# Patient Record
Sex: Female | Born: 1935 | Race: White | Hispanic: No | Smoking: Former smoker
Health system: Southern US, Community
[De-identification: ages and names within clinical notes are randomized; demographics above are authoritative.]

## PROBLEM LIST (undated history)

## (undated) DIAGNOSIS — F329 Major depressive disorder, single episode, unspecified: Secondary | ICD-10-CM

## (undated) DIAGNOSIS — G3184 Mild cognitive impairment, so stated: Secondary | ICD-10-CM

## (undated) DIAGNOSIS — R300 Dysuria: Secondary | ICD-10-CM

## (undated) DIAGNOSIS — N189 Chronic kidney disease, unspecified: Secondary | ICD-10-CM

## (undated) DIAGNOSIS — C4490 Unspecified malignant neoplasm of skin, unspecified: Secondary | ICD-10-CM

## (undated) DIAGNOSIS — B351 Tinea unguium: Secondary | ICD-10-CM

## (undated) DIAGNOSIS — J387 Other diseases of larynx: Secondary | ICD-10-CM

## (undated) DIAGNOSIS — K219 Gastro-esophageal reflux disease without esophagitis: Secondary | ICD-10-CM

## (undated) DIAGNOSIS — F32A Depression, unspecified: Secondary | ICD-10-CM

## (undated) DIAGNOSIS — S62101A Fracture of unspecified carpal bone, right wrist, initial encounter for closed fracture: Secondary | ICD-10-CM

## (undated) DIAGNOSIS — M810 Age-related osteoporosis without current pathological fracture: Secondary | ICD-10-CM

## (undated) DIAGNOSIS — R739 Hyperglycemia, unspecified: Secondary | ICD-10-CM

## (undated) DIAGNOSIS — I6529 Occlusion and stenosis of unspecified carotid artery: Secondary | ICD-10-CM

## (undated) DIAGNOSIS — E875 Hyperkalemia: Secondary | ICD-10-CM

## (undated) DIAGNOSIS — I10 Essential (primary) hypertension: Secondary | ICD-10-CM

## (undated) DIAGNOSIS — E538 Deficiency of other specified B group vitamins: Secondary | ICD-10-CM

## (undated) DIAGNOSIS — N200 Calculus of kidney: Secondary | ICD-10-CM

## (undated) DIAGNOSIS — R498 Other voice and resonance disorders: Secondary | ICD-10-CM

## (undated) DIAGNOSIS — E785 Hyperlipidemia, unspecified: Secondary | ICD-10-CM

## (undated) DIAGNOSIS — G609 Hereditary and idiopathic neuropathy, unspecified: Secondary | ICD-10-CM

## (undated) DIAGNOSIS — R569 Unspecified convulsions: Secondary | ICD-10-CM

## (undated) DIAGNOSIS — K2100 Gastro-esophageal reflux disease with esophagitis, without bleeding: Secondary | ICD-10-CM

## (undated) DIAGNOSIS — N905 Atrophy of vulva: Secondary | ICD-10-CM

## (undated) DIAGNOSIS — M7120 Synovial cyst of popliteal space [Baker], unspecified knee: Secondary | ICD-10-CM

## (undated) DIAGNOSIS — R32 Unspecified urinary incontinence: Secondary | ICD-10-CM

## (undated) DIAGNOSIS — H3530 Unspecified macular degeneration: Secondary | ICD-10-CM

## (undated) DIAGNOSIS — E569 Vitamin deficiency, unspecified: Secondary | ICD-10-CM

## (undated) DIAGNOSIS — I82409 Acute embolism and thrombosis of unspecified deep veins of unspecified lower extremity: Secondary | ICD-10-CM

## (undated) HISTORY — DX: Vitamin deficiency, unspecified: E56.9

## (undated) HISTORY — DX: Age-related osteoporosis without current pathological fracture: M81.0

## (undated) HISTORY — DX: Chronic kidney disease, unspecified: N18.9

## (undated) HISTORY — DX: Occlusion and stenosis of unspecified carotid artery: I65.29

## (undated) HISTORY — DX: Calculus of kidney: N20.0

## (undated) HISTORY — DX: Hyperkalemia: E87.5

## (undated) HISTORY — DX: Atrophy of vulva: N90.5

## (undated) HISTORY — PX: EXCISIONAL HEMORRHOIDECTOMY: SHX1541

## (undated) HISTORY — DX: Hyperglycemia, unspecified: R73.9

## (undated) HISTORY — DX: Gastro-esophageal reflux disease with esophagitis: K21.0

## (undated) HISTORY — DX: Major depressive disorder, single episode, unspecified: F32.9

## (undated) HISTORY — DX: Other diseases of larynx: J38.7

## (undated) HISTORY — DX: Depression, unspecified: F32.A

## (undated) HISTORY — PX: STOMACH SURGERY: SHX791

## (undated) HISTORY — DX: Hereditary and idiopathic neuropathy, unspecified: G60.9

## (undated) HISTORY — PX: VAGINAL HYSTERECTOMY: SUR661

## (undated) HISTORY — DX: Unspecified macular degeneration: H35.30

## (undated) HISTORY — DX: Mild cognitive impairment of uncertain or unknown etiology: G31.84

## (undated) HISTORY — DX: Unspecified malignant neoplasm of skin, unspecified: C44.90

## (undated) HISTORY — DX: Unspecified urinary incontinence: R32

## (undated) HISTORY — DX: Deficiency of other specified B group vitamins: E53.8

## (undated) HISTORY — DX: Hyperlipidemia, unspecified: E78.5

## (undated) HISTORY — DX: Tinea unguium: B35.1

## (undated) HISTORY — DX: Dysuria: R30.0

## (undated) HISTORY — DX: Essential (primary) hypertension: I10

## (undated) HISTORY — DX: Gastro-esophageal reflux disease with esophagitis, without bleeding: K21.00

## (undated) HISTORY — DX: Other voice and resonance disorders: R49.8

## (undated) HISTORY — PX: DILATION AND CURETTAGE OF UTERUS: SHX78

---
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
## 1989-07-07 HISTORY — PX: CATARACT EXTRACTION W/ INTRAOCULAR LENS  IMPLANT, BILATERAL: SHX1307

## 2004-11-06 HISTORY — PX: CAROTID ENDARTERECTOMY: SUR193

## 2008-09-23 ENCOUNTER — Ambulatory Visit: Payer: Self-pay

## 2009-08-12 ENCOUNTER — Encounter: Admission: RE | Admit: 2009-08-12 | Discharge: 2009-08-12 | Payer: Self-pay | Admitting: Internal Medicine

## 2009-08-25 ENCOUNTER — Encounter: Admission: RE | Admit: 2009-08-25 | Discharge: 2009-08-25 | Payer: Self-pay | Admitting: Internal Medicine

## 2010-02-09 ENCOUNTER — Encounter: Admission: RE | Admit: 2010-02-09 | Discharge: 2010-02-09 | Payer: Self-pay | Admitting: Internal Medicine

## 2010-09-23 ENCOUNTER — Encounter: Admission: RE | Admit: 2010-09-23 | Discharge: 2010-09-23 | Payer: Self-pay | Admitting: Internal Medicine

## 2010-11-26 ENCOUNTER — Encounter: Payer: Self-pay | Admitting: Internal Medicine

## 2011-08-15 ENCOUNTER — Other Ambulatory Visit: Payer: Self-pay | Admitting: Internal Medicine

## 2011-08-15 DIAGNOSIS — Z1231 Encounter for screening mammogram for malignant neoplasm of breast: Secondary | ICD-10-CM

## 2011-10-05 ENCOUNTER — Ambulatory Visit: Payer: Self-pay

## 2011-10-10 ENCOUNTER — Other Ambulatory Visit: Payer: Self-pay | Admitting: Internal Medicine

## 2011-10-10 DIAGNOSIS — Z780 Asymptomatic menopausal state: Secondary | ICD-10-CM

## 2011-11-06 ENCOUNTER — Other Ambulatory Visit: Payer: Self-pay

## 2011-11-06 ENCOUNTER — Ambulatory Visit: Payer: Self-pay

## 2011-11-13 DIAGNOSIS — IMO0002 Reserved for concepts with insufficient information to code with codable children: Secondary | ICD-10-CM | POA: Diagnosis not present

## 2011-11-21 DIAGNOSIS — I10 Essential (primary) hypertension: Secondary | ICD-10-CM | POA: Diagnosis not present

## 2011-11-21 DIAGNOSIS — F17200 Nicotine dependence, unspecified, uncomplicated: Secondary | ICD-10-CM | POA: Diagnosis not present

## 2011-11-21 DIAGNOSIS — E559 Vitamin D deficiency, unspecified: Secondary | ICD-10-CM | POA: Diagnosis not present

## 2011-12-07 ENCOUNTER — Other Ambulatory Visit: Payer: Self-pay

## 2011-12-07 ENCOUNTER — Ambulatory Visit: Payer: Self-pay

## 2011-12-29 ENCOUNTER — Other Ambulatory Visit: Payer: Self-pay

## 2011-12-29 ENCOUNTER — Ambulatory Visit: Payer: Self-pay

## 2012-01-17 ENCOUNTER — Ambulatory Visit
Admission: RE | Admit: 2012-01-17 | Discharge: 2012-01-17 | Disposition: A | Discharge status: Home / Self Care | Payer: Medicare Other | Source: Ambulatory Visit | Attending: Internal Medicine | Admitting: Internal Medicine

## 2012-01-17 DIAGNOSIS — I129 Hypertensive chronic kidney disease with stage 1 through stage 4 chronic kidney disease, or unspecified chronic kidney disease: Secondary | ICD-10-CM | POA: Diagnosis not present

## 2012-01-17 DIAGNOSIS — Z1231 Encounter for screening mammogram for malignant neoplasm of breast: Secondary | ICD-10-CM

## 2012-01-17 DIAGNOSIS — Z13820 Encounter for screening for osteoporosis: Secondary | ICD-10-CM | POA: Diagnosis not present

## 2012-01-17 DIAGNOSIS — Z780 Asymptomatic menopausal state: Secondary | ICD-10-CM | POA: Diagnosis not present

## 2012-01-17 DIAGNOSIS — E785 Hyperlipidemia, unspecified: Secondary | ICD-10-CM | POA: Diagnosis not present

## 2012-01-17 DIAGNOSIS — E559 Vitamin D deficiency, unspecified: Secondary | ICD-10-CM | POA: Diagnosis not present

## 2012-01-23 DIAGNOSIS — F17200 Nicotine dependence, unspecified, uncomplicated: Secondary | ICD-10-CM | POA: Diagnosis not present

## 2012-01-23 DIAGNOSIS — E559 Vitamin D deficiency, unspecified: Secondary | ICD-10-CM | POA: Diagnosis not present

## 2012-01-23 DIAGNOSIS — E538 Deficiency of other specified B group vitamins: Secondary | ICD-10-CM | POA: Diagnosis not present

## 2012-01-23 DIAGNOSIS — E785 Hyperlipidemia, unspecified: Secondary | ICD-10-CM | POA: Diagnosis not present

## 2012-04-25 DIAGNOSIS — G609 Hereditary and idiopathic neuropathy, unspecified: Secondary | ICD-10-CM | POA: Diagnosis not present

## 2012-04-25 DIAGNOSIS — I10 Essential (primary) hypertension: Secondary | ICD-10-CM | POA: Diagnosis not present

## 2012-04-25 DIAGNOSIS — M810 Age-related osteoporosis without current pathological fracture: Secondary | ICD-10-CM | POA: Diagnosis not present

## 2012-04-25 DIAGNOSIS — E559 Vitamin D deficiency, unspecified: Secondary | ICD-10-CM | POA: Diagnosis not present

## 2012-04-30 DIAGNOSIS — M810 Age-related osteoporosis without current pathological fracture: Secondary | ICD-10-CM | POA: Diagnosis not present

## 2012-04-30 DIAGNOSIS — E875 Hyperkalemia: Secondary | ICD-10-CM | POA: Diagnosis not present

## 2012-04-30 DIAGNOSIS — I10 Essential (primary) hypertension: Secondary | ICD-10-CM | POA: Diagnosis not present

## 2012-04-30 DIAGNOSIS — G609 Hereditary and idiopathic neuropathy, unspecified: Secondary | ICD-10-CM | POA: Diagnosis not present

## 2012-05-08 DIAGNOSIS — I10 Essential (primary) hypertension: Secondary | ICD-10-CM | POA: Diagnosis not present

## 2012-05-08 DIAGNOSIS — G609 Hereditary and idiopathic neuropathy, unspecified: Secondary | ICD-10-CM | POA: Diagnosis not present

## 2012-05-08 DIAGNOSIS — R300 Dysuria: Secondary | ICD-10-CM | POA: Diagnosis not present

## 2012-05-08 DIAGNOSIS — N390 Urinary tract infection, site not specified: Secondary | ICD-10-CM | POA: Diagnosis not present

## 2012-05-08 DIAGNOSIS — E875 Hyperkalemia: Secondary | ICD-10-CM | POA: Diagnosis not present

## 2012-05-14 DIAGNOSIS — H3530 Unspecified macular degeneration: Secondary | ICD-10-CM | POA: Diagnosis not present

## 2012-05-14 DIAGNOSIS — H5231 Anisometropia: Secondary | ICD-10-CM | POA: Diagnosis not present

## 2012-05-24 ENCOUNTER — Encounter (INDEPENDENT_AMBULATORY_CARE_PROVIDER_SITE_OTHER): Payer: Medicare Other | Admitting: Ophthalmology

## 2012-05-24 DIAGNOSIS — H43819 Vitreous degeneration, unspecified eye: Secondary | ICD-10-CM

## 2012-05-24 DIAGNOSIS — H3530 Unspecified macular degeneration: Secondary | ICD-10-CM | POA: Diagnosis not present

## 2012-05-24 DIAGNOSIS — H35039 Hypertensive retinopathy, unspecified eye: Secondary | ICD-10-CM

## 2012-05-24 DIAGNOSIS — I10 Essential (primary) hypertension: Secondary | ICD-10-CM

## 2012-06-13 DIAGNOSIS — E875 Hyperkalemia: Secondary | ICD-10-CM | POA: Diagnosis not present

## 2012-06-13 DIAGNOSIS — N390 Urinary tract infection, site not specified: Secondary | ICD-10-CM | POA: Diagnosis not present

## 2012-06-13 DIAGNOSIS — I10 Essential (primary) hypertension: Secondary | ICD-10-CM | POA: Diagnosis not present

## 2012-06-13 DIAGNOSIS — N905 Atrophy of vulva: Secondary | ICD-10-CM | POA: Diagnosis not present

## 2012-06-27 DIAGNOSIS — E875 Hyperkalemia: Secondary | ICD-10-CM | POA: Diagnosis not present

## 2012-06-27 DIAGNOSIS — N905 Atrophy of vulva: Secondary | ICD-10-CM | POA: Diagnosis not present

## 2012-06-27 DIAGNOSIS — I10 Essential (primary) hypertension: Secondary | ICD-10-CM | POA: Diagnosis not present

## 2012-09-24 ENCOUNTER — Ambulatory Visit (INDEPENDENT_AMBULATORY_CARE_PROVIDER_SITE_OTHER): Payer: Medicare Other | Admitting: Ophthalmology

## 2012-09-24 DIAGNOSIS — H43819 Vitreous degeneration, unspecified eye: Secondary | ICD-10-CM

## 2012-09-24 DIAGNOSIS — H35379 Puckering of macula, unspecified eye: Secondary | ICD-10-CM

## 2012-09-24 DIAGNOSIS — I10 Essential (primary) hypertension: Secondary | ICD-10-CM

## 2012-09-24 DIAGNOSIS — H3530 Unspecified macular degeneration: Secondary | ICD-10-CM

## 2012-09-24 DIAGNOSIS — H4011X0 Primary open-angle glaucoma, stage unspecified: Secondary | ICD-10-CM

## 2012-10-17 DIAGNOSIS — I129 Hypertensive chronic kidney disease with stage 1 through stage 4 chronic kidney disease, or unspecified chronic kidney disease: Secondary | ICD-10-CM | POA: Diagnosis not present

## 2012-10-17 DIAGNOSIS — G609 Hereditary and idiopathic neuropathy, unspecified: Secondary | ICD-10-CM | POA: Diagnosis not present

## 2012-10-17 DIAGNOSIS — E785 Hyperlipidemia, unspecified: Secondary | ICD-10-CM | POA: Diagnosis not present

## 2012-10-17 DIAGNOSIS — N905 Atrophy of vulva: Secondary | ICD-10-CM | POA: Diagnosis not present

## 2012-10-17 DIAGNOSIS — E875 Hyperkalemia: Secondary | ICD-10-CM | POA: Diagnosis not present

## 2012-10-17 DIAGNOSIS — E559 Vitamin D deficiency, unspecified: Secondary | ICD-10-CM | POA: Diagnosis not present

## 2013-02-04 ENCOUNTER — Other Ambulatory Visit: Payer: Self-pay | Admitting: *Deleted

## 2013-02-04 MED ORDER — FENOFIBRATE 54 MG PO TABS: ORAL_TABLET | ORAL | Status: DC

## 2013-02-04 MED ORDER — OMEPRAZOLE MAGNESIUM 20 MG PO TBEC: DELAYED_RELEASE_TABLET | ORAL | Status: DC

## 2013-02-04 MED ORDER — AMLODIPINE BESYLATE 10 MG PO TABS: ORAL_TABLET | ORAL | Status: DC

## 2013-02-04 MED ORDER — SIMVASTATIN 10 MG PO TABS: ORAL_TABLET | ORAL | Status: DC

## 2013-02-10 DIAGNOSIS — N183 Chronic kidney disease, stage 3 unspecified: Secondary | ICD-10-CM | POA: Diagnosis not present

## 2013-02-10 DIAGNOSIS — N2581 Secondary hyperparathyroidism of renal origin: Secondary | ICD-10-CM | POA: Diagnosis not present

## 2013-02-10 DIAGNOSIS — I10 Essential (primary) hypertension: Secondary | ICD-10-CM | POA: Diagnosis not present

## 2013-02-10 DIAGNOSIS — D649 Anemia, unspecified: Secondary | ICD-10-CM | POA: Diagnosis not present

## 2013-02-18 ENCOUNTER — Encounter: Payer: Self-pay | Admitting: *Deleted

## 2013-02-18 ENCOUNTER — Encounter: Payer: Self-pay | Admitting: Internal Medicine

## 2013-02-18 ENCOUNTER — Ambulatory Visit (INDEPENDENT_AMBULATORY_CARE_PROVIDER_SITE_OTHER): Payer: Medicare Other | Admitting: Internal Medicine

## 2013-02-18 VITALS — BP 142/78 | HR 67 | Temp 98.0°F | Resp 16 | Ht 62.0 in | Wt 155.6 lb

## 2013-02-18 DIAGNOSIS — C4490 Unspecified malignant neoplasm of skin, unspecified: Secondary | ICD-10-CM | POA: Insufficient documentation

## 2013-02-18 DIAGNOSIS — E538 Deficiency of other specified B group vitamins: Secondary | ICD-10-CM

## 2013-02-18 DIAGNOSIS — N2889 Other specified disorders of kidney and ureter: Secondary | ICD-10-CM | POA: Diagnosis not present

## 2013-02-18 DIAGNOSIS — F419 Anxiety disorder, unspecified: Secondary | ICD-10-CM | POA: Insufficient documentation

## 2013-02-18 DIAGNOSIS — G5790 Unspecified mononeuropathy of unspecified lower limb: Secondary | ICD-10-CM

## 2013-02-18 DIAGNOSIS — E559 Vitamin D deficiency, unspecified: Secondary | ICD-10-CM

## 2013-02-18 DIAGNOSIS — G8929 Other chronic pain: Secondary | ICD-10-CM

## 2013-02-18 DIAGNOSIS — I10 Essential (primary) hypertension: Secondary | ICD-10-CM

## 2013-02-18 DIAGNOSIS — N905 Atrophy of vulva: Secondary | ICD-10-CM

## 2013-02-18 DIAGNOSIS — R197 Diarrhea, unspecified: Secondary | ICD-10-CM

## 2013-02-18 DIAGNOSIS — R52 Pain, unspecified: Secondary | ICD-10-CM

## 2013-02-18 DIAGNOSIS — N390 Urinary tract infection, site not specified: Secondary | ICD-10-CM | POA: Insufficient documentation

## 2013-02-18 DIAGNOSIS — R110 Nausea: Secondary | ICD-10-CM | POA: Insufficient documentation

## 2013-02-18 DIAGNOSIS — J387 Other diseases of larynx: Secondary | ICD-10-CM | POA: Insufficient documentation

## 2013-02-18 DIAGNOSIS — J383 Other diseases of vocal cords: Secondary | ICD-10-CM | POA: Insufficient documentation

## 2013-02-18 DIAGNOSIS — G3184 Mild cognitive impairment, so stated: Secondary | ICD-10-CM

## 2013-02-18 DIAGNOSIS — R1013 Epigastric pain: Secondary | ICD-10-CM | POA: Insufficient documentation

## 2013-02-18 DIAGNOSIS — R300 Dysuria: Secondary | ICD-10-CM | POA: Insufficient documentation

## 2013-02-18 DIAGNOSIS — K219 Gastro-esophageal reflux disease without esophagitis: Secondary | ICD-10-CM

## 2013-02-18 DIAGNOSIS — I6990 Unspecified sequelae of unspecified cerebrovascular disease: Secondary | ICD-10-CM

## 2013-02-18 DIAGNOSIS — F17200 Nicotine dependence, unspecified, uncomplicated: Secondary | ICD-10-CM

## 2013-02-18 DIAGNOSIS — F329 Major depressive disorder, single episode, unspecified: Secondary | ICD-10-CM | POA: Insufficient documentation

## 2013-02-18 DIAGNOSIS — Z0000 Encounter for general adult medical examination without abnormal findings: Secondary | ICD-10-CM

## 2013-02-18 DIAGNOSIS — G609 Hereditary and idiopathic neuropathy, unspecified: Secondary | ICD-10-CM | POA: Insufficient documentation

## 2013-02-18 DIAGNOSIS — R498 Other voice and resonance disorders: Secondary | ICD-10-CM

## 2013-02-18 DIAGNOSIS — E785 Hyperlipidemia, unspecified: Secondary | ICD-10-CM

## 2013-02-18 DIAGNOSIS — K644 Residual hemorrhoidal skin tags: Secondary | ICD-10-CM

## 2013-02-18 DIAGNOSIS — I6529 Occlusion and stenosis of unspecified carotid artery: Secondary | ICD-10-CM | POA: Insufficient documentation

## 2013-02-18 DIAGNOSIS — G894 Chronic pain syndrome: Secondary | ICD-10-CM | POA: Insufficient documentation

## 2013-02-18 DIAGNOSIS — H908 Mixed conductive and sensorineural hearing loss, unspecified: Secondary | ICD-10-CM | POA: Insufficient documentation

## 2013-02-18 DIAGNOSIS — E875 Hyperkalemia: Secondary | ICD-10-CM

## 2013-02-18 DIAGNOSIS — L989 Disorder of the skin and subcutaneous tissue, unspecified: Secondary | ICD-10-CM | POA: Insufficient documentation

## 2013-02-18 DIAGNOSIS — B351 Tinea unguium: Secondary | ICD-10-CM | POA: Insufficient documentation

## 2013-02-18 DIAGNOSIS — J069 Acute upper respiratory infection, unspecified: Secondary | ICD-10-CM | POA: Insufficient documentation

## 2013-02-18 DIAGNOSIS — R32 Unspecified urinary incontinence: Secondary | ICD-10-CM | POA: Insufficient documentation

## 2013-02-18 DIAGNOSIS — I129 Hypertensive chronic kidney disease with stage 1 through stage 4 chronic kidney disease, or unspecified chronic kidney disease: Secondary | ICD-10-CM

## 2013-02-18 DIAGNOSIS — R350 Frequency of micturition: Secondary | ICD-10-CM | POA: Insufficient documentation

## 2013-02-18 DIAGNOSIS — G5792 Unspecified mononeuropathy of left lower limb: Secondary | ICD-10-CM

## 2013-02-18 DIAGNOSIS — N183 Chronic kidney disease, stage 3 unspecified: Secondary | ICD-10-CM | POA: Insufficient documentation

## 2013-02-18 MED ORDER — AMLODIPINE BESYLATE 10 MG PO TABS: ORAL_TABLET | ORAL | Status: DC

## 2013-02-18 MED ORDER — TRAMADOL HCL 50 MG PO TABS: 50.0000 mg | ORAL_TABLET | Freq: Two times a day (BID) | ORAL | Status: DC | PRN

## 2013-02-18 MED ORDER — FENOFIBRATE 54 MG PO TABS: ORAL_TABLET | ORAL | Status: DC

## 2013-02-18 MED ORDER — OMEPRAZOLE MAGNESIUM 20 MG PO TBEC: DELAYED_RELEASE_TABLET | ORAL | Status: DC

## 2013-02-18 MED ORDER — GABAPENTIN 100 MG PO CAPS: 100.0000 mg | ORAL_CAPSULE | Freq: Every day | ORAL | Status: DC

## 2013-02-18 MED ORDER — SIMVASTATIN 10 MG PO TABS: ORAL_TABLET | ORAL | Status: DC

## 2013-02-18 MED ORDER — AMITRIPTYLINE HCL 50 MG PO TABS: 50.0000 mg | ORAL_TABLET | Freq: Every day | ORAL | Status: DC

## 2013-02-18 NOTE — Progress Notes (Signed)
Passed Clock Drawing 

## 2013-02-18 NOTE — Patient Instructions (Signed)
Stop taking pentazocine. I have changed your gabapentin to once a day. Start using estrogen cream on a daily basis. Please get bloodwork prior to your next visit

## 2013-02-18 NOTE — Progress Notes (Signed)
Subjective:    Patient ID: Brittany Hughes, female    DOB: April 25, 1928, 79 y.o.   MRN: [id number]  No Known Allergies  HPI 79 y/o female patient is here for her routine visit. She has been taking her bp medication. She has been taking her gabapentin for foot pain only when needed as it makes her dizzy. She has been have intermittent loose stool for few days and then gets fine. Has noted some blood in toilet paper and toilet bowl. Has been having dryness and itching of her vagina uptodate with immunization Mammogram 2013 normal. Does not want any further mammogram dexa 3/13 suggestive of osteoporosis and pt on reclast  Review of Systems  Constitutional: Negative for fever, activity change, appetite change and fatigue.  HENT: Negative for congestion and neck pain.   Respiratory: Negative for shortness of breath.   Cardiovascular: Negative for chest pain and leg swelling.  Gastrointestinal: Positive for blood in stool and abdominal distention. Negative for nausea, vomiting, abdominal pain, diarrhea and constipation.  Endocrine: Negative for cold intolerance, polydipsia and polyuria.  Genitourinary: Positive for vaginal discharge. Negative for dysuria.  Musculoskeletal: Positive for arthralgias. Negative for back pain, joint swelling and gait problem.  Skin: Negative for pallor, rash and wound.  Neurological: Negative for dizziness, weakness and light-headedness.  Hematological: Negative for adenopathy.  Psychiatric/Behavioral: Negative for behavioral problems and agitation.   Past Medical History  Diagnosis Date  . Atrophy of vulva   . Dysuria   . Hyperpotassemia   . Unspecified hereditary and idiopathic peripheral neuropathy   . Reflux esophagitis   . Osteoporosis, unspecified   . Hypertension   . Vitamin deficiency   . Unspecified malignant neoplasm of skin, site unspecified   . Chronic kidney disease   . Unspecified late effects of cerebrovascular disease   . Dermatophytosis of  nail   . Other diseases of larynx   . Nausea alone   . Other B-complex deficiencies   . Generalized pain   . Mild cognitive impairment, so stated   . Other diseases of vocal cords   . Other voice and resonance disorders   . Depression   . Hyperlipidemia   . Occlusion and stenosis of carotid artery without mention of cerebral infarction   . Unspecified urinary incontinence   . Urinary frequency    Past Surgical History  Procedure Laterality Date  . Abdominal hysterectomy    . Stomach surgery    . Hemorrhoid surgery     Family History  Problem Relation Age of Onset  . Heart disease Mother   . Kidney disease Father   . Cancer Sister   . Cancer Brother   . Heart attack Son   . Stroke Brother    History   Social History  . Marital Status: Widowed    Spouse Name: N/A    Number of Children: N/A  . Years of Education: N/A   Occupational History  . Not on file.   Social History Main Topics  . Smoking status: Former Games developer  . Smokeless tobacco: Not on file  . Alcohol Use: No  . Drug Use: No  . Sexually Active: Not on file   Other Topics Concern  . Not on file   Social History Narrative  . No narrative on file      Objective:   Physical Exam  Constitutional: She is oriented to person, place, and time. She appears well-developed and well-nourished. No distress.  HENT:  Head: Normocephalic and atraumatic.  Right  Ear: External ear normal.  Left Ear: External ear normal.  Mouth/Throat: Oropharynx is clear and moist.  Eyes: Conjunctivae are normal. Pupils are equal, round, and reactive to light. No scleral icterus.  Neck: Normal range of motion. Neck supple. No JVD present. No tracheal deviation present. No thyromegaly present.  Right neck carotidectomy scar  Cardiovascular: Normal rate, regular rhythm, normal heart sounds and intact distal pulses.   No murmur heard. Pulmonary/Chest: Effort normal and breath sounds normal. She has no wheezes. She has no rales. She  exhibits no tenderness.  Normal breast exam  Abdominal: Soft. Bowel sounds are normal. She exhibits no distension and no mass. There is no tenderness. There is no guarding.  Genitourinary: Guaiac negative stool. No vaginal discharge found.  Vaginal mucosa dry and atrophy present  Musculoskeletal: Normal range of motion. She exhibits no edema and no tenderness.  Lymphadenopathy:    She has no cervical adenopathy.  Neurological: She is alert and oriented to person, place, and time. No cranial nerve deficit.  mmse 27/30 and passed clock draw  Skin: Skin is warm. She is not diaphoretic. No erythema. No pallor.  Excessively dry, scaly  Psychiatric: She has a normal mood and affect. Her behavior is normal.   BP 142/78  Pulse 67  Temp(Src) 98 F (36.7 C) (Oral)  Resp 16  Ht 5\' 2"  (1.575 m)  Wt 155 lb 9.6 oz (70.58 kg)  BMI 28.45 kg/m2  LABS REVIEWED 01/23/2012  PTH: 24 04/25/3012 BMP; Glucose 85, BUN 17, Creatinine 1.29 Vit 2518.2 04/30/2012 BMP Glucose 88 Bun 18 Creatinine 1.09 Potassium 5.7 u/a- leucocytes 75, sp grav 1.015  06/13/2012 BMP Glucose 86 Bun 19 Creatinine 1.08     Assessment & Plan:   Vitamin d deficiency- continue ca-vit d supplement  Hyperlipidemia- refill on simvastatin and fenofibrate provided, check flp prior to next visit  Depression- mood has been stable. Amitriptyline has been helping her mood and her aches/ pain as well  Chronic pain- on pentazocine and using standing gabapentin only prn. Will d/c pentazocine with pt having dizziness and diarrhea which could be side effect from this medication. Will introduce tramadol q12h prn for pain and change gabapentin from 100 mg tid (pt not taking it anyway) to 100 mg once a day and titrate further as needed  Vaginal atrophy- dry mucosa and atrophy, from estrogen deficiency post menopausal. Will have her on estrogen cream - pt has it at home but not using it. Recommended daily use once a day for now  Hypertension-  continue current regimen of metoprolol and amlodipine and monitor bp. Monitor bmp next visit. Pt has been seen by renal- office note pending. 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  Osteoporosis- reviewed dexa from 2013. On reclast once a year. wil recheck renal function next visit and schedule for reclast injection

## 2013-03-05 ENCOUNTER — Encounter: Payer: Self-pay | Admitting: Internal Medicine

## 2013-03-05 ENCOUNTER — Other Ambulatory Visit: Payer: Self-pay | Admitting: *Deleted

## 2013-03-05 DIAGNOSIS — E785 Hyperlipidemia, unspecified: Secondary | ICD-10-CM

## 2013-03-05 MED ORDER — FENOFIBRATE 54 MG PO TABS: ORAL_TABLET | ORAL | Status: DC

## 2013-03-05 MED ORDER — SIMVASTATIN 10 MG PO TABS: ORAL_TABLET | ORAL | Status: DC

## 2013-03-05 MED ORDER — METOPROLOL SUCCINATE ER 25 MG PO TB24: 25.0000 mg | ORAL_TABLET | Freq: Every day | ORAL | Status: DC

## 2013-03-11 ENCOUNTER — Ambulatory Visit: Payer: Medicare Other | Admitting: Internal Medicine

## 2013-03-12 ENCOUNTER — Encounter: Payer: Self-pay | Admitting: *Deleted

## 2013-03-12 ENCOUNTER — Ambulatory Visit (INDEPENDENT_AMBULATORY_CARE_PROVIDER_SITE_OTHER): Payer: Medicare Other | Admitting: Internal Medicine

## 2013-03-12 ENCOUNTER — Encounter: Payer: Self-pay | Admitting: Internal Medicine

## 2013-03-12 VITALS — BP 132/86 | HR 73 | Temp 98.0°F | Resp 14 | Ht 62.0 in | Wt 146.8 lb

## 2013-03-12 DIAGNOSIS — K5289 Other specified noninfective gastroenteritis and colitis: Secondary | ICD-10-CM

## 2013-03-12 DIAGNOSIS — K529 Noninfective gastroenteritis and colitis, unspecified: Secondary | ICD-10-CM

## 2013-03-12 DIAGNOSIS — J069 Acute upper respiratory infection, unspecified: Secondary | ICD-10-CM

## 2013-03-12 DIAGNOSIS — K117 Disturbances of salivary secretion: Secondary | ICD-10-CM | POA: Diagnosis not present

## 2013-03-12 DIAGNOSIS — I10 Essential (primary) hypertension: Secondary | ICD-10-CM | POA: Diagnosis not present

## 2013-03-12 MED ORDER — PILOCARPINE HCL 5 MG PO TABS: 5.0000 mg | ORAL_TABLET | Freq: Three times a day (TID) | ORAL | Status: DC

## 2013-03-12 NOTE — Progress Notes (Signed)
  Subjective:    Patient ID: Brittany Hughes, female    DOB: [birth year], 79 y.o.   MRN: [id number]  Chief Complaint  Patient presents with  . Generalized Body Aches    better now  . Diarrhea    better now  . dry mouth  . Cough    persists   HPI 79 y/o female patient here with complaints of cough, bodyache and dry mouth. She was having cough, sick in the stomach with loose stool and generalized bodyache for 2 weeks. She was feverish, nauseous during the episode. She took imodium and her diarrhea resolved. Her bodyache has also resolved and current pain regimen has been helpful as well She still has some cough with white phlegm Has dry mouth and is having difficulty talking and eating with it. No fever or chills at present but was feeling feverish 2 weeks back Her appetite is coming back. No more nausea and vomiting Energy level is fair Denies chest pain or SOB No more bodyaches. It has resolved  Review of Systems  Constitutional: Negative for chills, activity change and appetite change.  HENT: Negative for congestion.   Respiratory: Positive for cough. Negative for chest tightness, shortness of breath and wheezing.   Cardiovascular: Negative for chest pain, palpitations and leg swelling.  Gastrointestinal: Negative for nausea, vomiting, abdominal pain, blood in stool and abdominal distention.  Genitourinary: Negative for dysuria and vaginal pain.  Musculoskeletal: Positive for back pain and arthralgias.  Skin: Negative for pallor and rash.  Neurological: Negative for dizziness, weakness and light-headedness.  Hematological: Negative for adenopathy.  Psychiatric/Behavioral: Negative for agitation.      Objective:   Physical Exam  Constitutional: She is oriented to person, place, and time. No distress.  HENT:  Head: Normocephalic and atraumatic.  Eyes: Pupils are equal, round, and reactive to light.  Neck: Normal range of motion. Neck supple.  Cardiovascular: Normal rate and  regular rhythm.   Pulmonary/Chest: Effort normal and breath sounds normal.  Abdominal: Soft. Bowel sounds are normal.  Musculoskeletal: Normal range of motion. She exhibits no edema and no tenderness.  Lymphadenopathy:    She has no cervical adenopathy.  Neurological: She is alert and oriented to person, place, and time.  Skin: Skin is warm. She is not diaphoretic.  Dry, scaly skin  Psychiatric: She has a normal mood and affect. Her behavior is normal.  dry mouth, no mouth sores, no thrush  BP 132/86  Pulse 73  Temp(Src) 98 F (36.7 C) (Oral)  Resp 14  Ht 5\' 2"  (1.575 m)  Wt 146 lb 12.8 oz (66.588 kg)  BMI 26.84 kg/m2  SpO2 99%      Assessment & Plan:   Generalized body ache- present with this complaint but on further history taking, it has resolved  Diarrhea- resolved. Likely from viral gastroenteritis. Encouraged hydration. Appetite has improved. Monitor clinically  Cough- much improved. She seems to have had a viral uri and gastroenteritis, much of the sympotms have resolved. Will monitor clinically for now  Dry mouth- unclear reason. Will her having recovered from viral gastroenteritis, will provide pilocarpine for now, encouraged hydration and reassess in next visit  HTN- bp well controlled.cotnineu amlodipine and toprol.

## 2013-03-21 ENCOUNTER — Other Ambulatory Visit: Payer: Self-pay | Admitting: Internal Medicine

## 2013-03-26 ENCOUNTER — Ambulatory Visit (INDEPENDENT_AMBULATORY_CARE_PROVIDER_SITE_OTHER): Payer: Medicare Other | Admitting: Ophthalmology

## 2013-03-26 DIAGNOSIS — H35379 Puckering of macula, unspecified eye: Secondary | ICD-10-CM | POA: Diagnosis not present

## 2013-03-26 DIAGNOSIS — H3530 Unspecified macular degeneration: Secondary | ICD-10-CM | POA: Diagnosis not present

## 2013-03-26 DIAGNOSIS — H43819 Vitreous degeneration, unspecified eye: Secondary | ICD-10-CM

## 2013-03-26 DIAGNOSIS — I10 Essential (primary) hypertension: Secondary | ICD-10-CM | POA: Diagnosis not present

## 2013-03-26 DIAGNOSIS — H35039 Hypertensive retinopathy, unspecified eye: Secondary | ICD-10-CM | POA: Diagnosis not present

## 2013-05-05 ENCOUNTER — Encounter: Payer: Self-pay | Admitting: *Deleted

## 2013-05-05 DIAGNOSIS — I10 Essential (primary) hypertension: Secondary | ICD-10-CM | POA: Diagnosis not present

## 2013-05-05 DIAGNOSIS — N2581 Secondary hyperparathyroidism of renal origin: Secondary | ICD-10-CM | POA: Diagnosis not present

## 2013-05-05 DIAGNOSIS — D649 Anemia, unspecified: Secondary | ICD-10-CM | POA: Diagnosis not present

## 2013-05-05 DIAGNOSIS — I129 Hypertensive chronic kidney disease with stage 1 through stage 4 chronic kidney disease, or unspecified chronic kidney disease: Secondary | ICD-10-CM | POA: Diagnosis not present

## 2013-05-05 DIAGNOSIS — R300 Dysuria: Secondary | ICD-10-CM | POA: Diagnosis not present

## 2013-05-05 DIAGNOSIS — N183 Chronic kidney disease, stage 3 unspecified: Secondary | ICD-10-CM | POA: Diagnosis not present

## 2013-05-06 ENCOUNTER — Other Ambulatory Visit: Payer: Self-pay | Admitting: Internal Medicine

## 2013-05-07 ENCOUNTER — Ambulatory Visit (INDEPENDENT_AMBULATORY_CARE_PROVIDER_SITE_OTHER): Payer: Medicare Other | Admitting: Internal Medicine

## 2013-05-07 ENCOUNTER — Encounter: Payer: Self-pay | Admitting: Internal Medicine

## 2013-05-07 VITALS — BP 112/60 | HR 72 | Temp 97.9°F | Resp 18 | Ht 62.0 in | Wt 149.0 lb

## 2013-05-07 DIAGNOSIS — N289 Disorder of kidney and ureter, unspecified: Secondary | ICD-10-CM | POA: Diagnosis not present

## 2013-05-07 DIAGNOSIS — R200 Anesthesia of skin: Secondary | ICD-10-CM

## 2013-05-07 DIAGNOSIS — R300 Dysuria: Secondary | ICD-10-CM | POA: Diagnosis not present

## 2013-05-07 DIAGNOSIS — R209 Unspecified disturbances of skin sensation: Secondary | ICD-10-CM

## 2013-05-07 MED ORDER — FLAVOXATE HCL 100 MG PO TABS: 100.0000 mg | ORAL_TABLET | Freq: Three times a day (TID) | ORAL | Status: DC | PRN

## 2013-05-07 NOTE — Progress Notes (Signed)
Patient ID: Brittany Hughes, female   DOB: [birth year], 79 y.o.   MRN: [id number]  Chief Complaint  Patient presents with  . Acute Visit    rt arm/hand numbness during the night    No Known Allergies  HPI-  Complaints of having numbness in her right arm on and off at bedtime for past couple of times. She would get up from her sleep and have numbness in her right arm and hand and moving her hand would bring back her sensation. Denies any pain in her neck area or arm. No numbness for past 3 days. Currently is symptom free. dneies any tingling in her palms or fingers Her pain is under control for her back She has been having increased urinary frequency with pain on urination. Recently seen by renal Dr Allena Katz and scheduled for ? MRI today.  No other complaints Compliant with her medications  ROS- No fever or chills No nausea or vomiting No focal weakness Sleeping well at night Appetite is good  BP 112/60  Pulse 72  Temp(Src) 97.9 F (36.6 C) (Oral)  Resp 18  Ht 5\' 2"  (1.575 m)  Wt 149 lb (67.586 kg)  BMI 27.25 kg/m2  SpO2 95%  Constitutional: She is oriented to person, place, and time. No distress.  HENT:   Head: Normocephalic and atraumatic.  Eyes: Pupils are equal, round, and reactive to light.  Neck: Normal range of motion. Neck supple.  Cardiovascular: Normal rate and regular rhythm.   Pulmonary/Chest: Effort normal and breath sounds normal.  Abdominal: Soft. Bowel sounds are normal. No suprapubic tenderness Musculoskeletal: Normal range of motion. She exhibits no edema and no tenderness. No cervical tenderness. Normal ROM at shoulder joint Lymphadenopathy:    She has no cervical adenopathy.  Neurological: She is alert and oriented to person, place, and time.  Skin: Skin is warm. She is not diaphoretic.  Dry, scaly skin  Psychiatric: She has a normal mood and affect. Her behavior is normal.   ASSESSMENT/PLAN-  Dysuria- Will send her urine for u/a with culture/  sensitivity. Have her on flavoxate for now to help with bladder spasms. Follow up on renal notes and mri result. Encouraged hydration  Numbness right hand- resolved at present. Most likely positional-- Possible nerve compression during sleep. Normal neuro exam at present and normal ROM. Monitor clinically for now

## 2013-05-14 ENCOUNTER — Other Ambulatory Visit: Payer: Medicare Other

## 2013-05-21 ENCOUNTER — Ambulatory Visit (INDEPENDENT_AMBULATORY_CARE_PROVIDER_SITE_OTHER): Payer: Medicare Other | Admitting: Internal Medicine

## 2013-05-21 ENCOUNTER — Ambulatory Visit: Payer: Medicare Other | Admitting: Internal Medicine

## 2013-05-21 ENCOUNTER — Encounter: Payer: Self-pay | Admitting: Internal Medicine

## 2013-05-21 DIAGNOSIS — E785 Hyperlipidemia, unspecified: Secondary | ICD-10-CM | POA: Diagnosis not present

## 2013-05-21 DIAGNOSIS — N183 Chronic kidney disease, stage 3 unspecified: Secondary | ICD-10-CM

## 2013-05-21 MED ORDER — DICYCLOMINE HCL 10 MG PO CAPS: 10.0000 mg | ORAL_CAPSULE | Freq: Three times a day (TID) | ORAL | Status: DC

## 2013-05-21 NOTE — Progress Notes (Signed)
Patient ID: Brittany Hughes, female   DOB: Nov 18, 1927, 79 y.o.   MRN: [id number]    PCP: Oneal Grout, MD  Code Status: has living will  No Known Allergies  Chief Complaint: annual exam  HPI:  79 y/o female patient is here for her annual exam. Her loose stool frequency has worsened. Imodium helps her some for a week or two and then it starts back again. Denies any abdominal cramps. This problem with loose stools has been going on for a long time and initially would be associated with cramping belly pain. Nowadays, she needs to wear pads at times with need for urgency to defecate to prevent accident. Denies any abdominal pain. Has ocassional blood in stools- sometimes clots and sometimes fresh blood. Also has hx of hemorrhoids. ocassional nausea associated with the loose stool. Denies vomiting. Her urinary complaints have resolved  bp remains stable The vaginal cream has been helpful wit the itching Reflux symptoms under control with current medication Compliant with her medications No falls reported  Mammogram 01/2012- negative for malignancy. She does not want any further mammogram dexa scan 2013 t score -2.5 in lumbar spine and -1.7 in left femur Refuses further pelvic exam Has not had colonoscopy for several years uptodate with flu and pneumococcal vaccine MMSE 02/2013 27/30  Review of Systems  Constitutional: Positive for malaise/fatigue. Negative for fever, chills, weight loss and diaphoresis.  HENT: Positive for hearing loss. Negative for congestion.   Eyes: Negative for blurred vision.       Wears corrective lenses  Respiratory: Negative for cough and shortness of breath.   Cardiovascular: Negative for chest pain, palpitations, orthopnea and leg swelling.  Gastrointestinal: Positive for nausea, diarrhea and blood in stool. Negative for heartburn, vomiting, abdominal pain and constipation.  Genitourinary: Negative for dysuria, hematuria and flank pain.  Musculoskeletal:  Negative for falls.  Skin: Negative for itching and rash.  Neurological: Positive for tingling. Negative for speech change, seizures and headaches.  Endo/Heme/Allergies: Does not bruise/bleed easily.  Psychiatric/Behavioral: Positive for depression and memory loss. The patient is nervous/anxious.     Past Medical History  Diagnosis Date  . Atrophy of vulva   . Dysuria   . Hyperpotassemia   . Unspecified hereditary and idiopathic peripheral neuropathy   . Reflux esophagitis   . Osteoporosis, unspecified   . Hypertension   . Vitamin deficiency   . Unspecified malignant neoplasm of skin, site unspecified   . Chronic kidney disease   . Unspecified late effects of cerebrovascular disease   . Dermatophytosis of nail   . Other diseases of larynx   . Nausea alone   . Other B-complex deficiencies   . Generalized pain   . Mild cognitive impairment, so stated   . Other diseases of vocal cords   . Other voice and resonance disorders   . Depression   . Hyperlipidemia   . Occlusion and stenosis of carotid artery without mention of cerebral infarction   . Unspecified urinary incontinence   . Urinary frequency    Past Surgical History  Procedure Laterality Date  . Abdominal hysterectomy    . Stomach surgery    . Hemorrhoid surgery     Social History:   reports that she has quit smoking. She does not have any smokeless tobacco history on file. She reports that she does not drink alcohol or use illicit drugs.  Family History  Problem Relation Age of Onset  . Heart disease Mother   . Kidney disease Father   .  Diabetes Sister   . Heart disease Brother   . Heart attack Son   . Cancer Son   . Heart disease Son   . Stroke Brother   . Cancer Brother   . Cancer Sister     Medications: Patient's Medications  New Prescriptions   No medications on file  Previous Medications   AMITRIPTYLINE (ELAVIL) 50 MG TABLET    Take 1 tablet (50 mg total) by mouth at bedtime. Take one tablet at  bedtime   AMLODIPINE (NORVASC) 10 MG TABLET    TAKE 1 TABLET DAILY   CALCIUM CARBONATE (OS-CAL) 600 MG TABS    Take 600 mg by mouth 2 (two) times daily with a meal. Take one tablet once in the morning and one in the evening   DIPYRIDAMOLE-ASPIRIN (AGGRENOX) 200-25 MG PER 12 HR CAPSULE    Take 1 capsule by mouth 2 (two) times daily. Take one tablet twice a day   ERGOCALCIFEROL (VITAMIN D2) 50000 UNITS CAPSULE    Take 50,000 Units by mouth once a week. Take one tablet once a week for vitamin d supplement   ESTROGENS CONJUGATED, SYNTHETIC A, (CENESTIN) 0.3 MG TABLET    Place 0.3 mg into feeding tube daily. Apply thin layer once a day   FENOFIBRATE 54 MG TABLET    Take one tablet once a day for cholesterol   FLAVOXATE (URISPAS) 100 MG TABLET    Take 1 tablet (100 mg total) by mouth 3 (three) times daily as needed.   GABAPENTIN (NEURONTIN) 100 MG CAPSULE    Take 1 capsule (100 mg total) by mouth daily.   METOPROLOL SUCCINATE (TOPROL-XL) 25 MG 24 HR TABLET    Take 1 tablet (25 mg total) by mouth daily. Take one tablet once a day for blood pressure   OMEPRAZOLE (PRILOSEC OTC) 20 MG TABLET    Take one tablet twice a day for acid reflux   OMEPRAZOLE (PRILOSEC) 20 MG CAPSULE    TAKE 1 CAPSULE TWICE DAILY   PENTAZOCINE-ACETAMINOPHEN (TALACEN) 25-650 MG TABS    Take one tablet once daily as needed for stomach   PENTAZOCINE-NALOXONE (TALWIN NX) 50-0.5 MG PER TABLET       PILOCARPINE (SALAGEN) 5 MG TABLET       PROMETHAZINE (PHENERGAN) 25 MG TABLET    Take one tablet once daily as needed for nausea   SIMVASTATIN (ZOCOR) 10 MG TABLET    Take one tablet once a day for cholesterol  Modified Medications   No medications on file  Discontinued Medications   No medications on file     Physical Exam: Filed Vitals:   05/21/13 0819  BP: 120/60  Pulse: 73  Temp: 99.4 F (37.4 C)  TempSrc: Oral  Resp: 16  Height: 5\' 2"  (1.575 m)  Weight: 149 lb 12.8 oz (67.949 kg)  SpO2: 98%   Physical Exam   Constitutional: She is oriented to person, place, and time. She appears well-developed and well-nourished. No distress.  HENT:  Head: Normocephalic and atraumatic.  Right Ear: External ear normal.  Left Ear: External ear normal.  Nose: Nose normal.  Mouth/Throat: Oropharynx is clear and moist. No oropharyngeal exudate.  Eyes: Conjunctivae and EOM are normal. Pupils are equal, round, and reactive to light. Right eye exhibits no discharge. Left eye exhibits no discharge. No scleral icterus.  Neck: Normal range of motion. Neck supple. No JVD present. No tracheal deviation present. No thyromegaly present.  Cardiovascular: Normal rate, regular rhythm, normal heart sounds and intact  distal pulses.  Exam reveals no gallop and no friction rub.   No murmur heard. Pulmonary/Chest: Effort normal and breath sounds normal. No stridor. No respiratory distress. She has no wheezes. She has no rales. She exhibits no tenderness.  Normal b/l breast exam  Abdominal: Soft. Bowel sounds are normal. She exhibits no distension and no mass. There is no tenderness. There is no rebound and no guarding.  Genitourinary: Guaiac negative stool.  Has external hemorrhoids, non thrombosed  Musculoskeletal: Normal range of motion. She exhibits no edema and no tenderness.  Lymphadenopathy:    She has no cervical adenopathy.  Neurological: She is alert and oriented to person, place, and time. She has normal reflexes. No cranial nerve deficit. Coordination normal.  Skin: Skin is warm. No rash noted. She is not diaphoretic. No erythema. No pallor.  Dry and scaly  Psychiatric: She has a normal mood and affect. Her behavior is normal. Judgment and thought content normal.    Labs reviewed:  01/23/2012 PTH: 24  04/25/3012 BMP; Glucose 85, BUN 17, Creatinine 1.29  Vit 2518.2  04/30/2012 BMP Glucose 88 Bun 18 Creatinine 1.09 Potassium 5.7 u/a- leucocytes 75, sp grav 1.015  06/13/2012 BMP Glucose 86 Bun 19 Creatinine 1.08     Assessment/Plan  Vaginal atrophy- dry mucosa and atrophy, from estrogen deficiency post menopausal.estrogen cream has been helpful. Continue this for now  Hypertension- continue current regimen of metoprolol and amlodipine and monitor bp. Monitor bmp.  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 Check flp  gerd- continue PPI bid, pt symptoms are stable most of the time. Refuses reduction in dosage at present  Osteoporosis- reviewed dexa from 2013. On reclast once a year. wil recheck renal function next visit and schedule for reclast injection  Diarrhea- persists and is intermittent. She mentions it was initially associated with abdominal cramps, but now with ocassional nausea. Concerns for IBS. Pt also provides remote history of diverticulosis. Encouraged fiber intake to bulk her stool and will try dicyclomine to see if this will help with her diarrhea.   Vitamin d deficiency- continue ca-vit d supplement  Hyperlipidemia- continue simvastatin and fenofibrate and check flp  Depression- mood has been stable. Amitriptyline has been helping her mood and her aches/ pain as well  Chronic pain- continue gabapentin for now and pentazocine, monitor   Labs/tests ordered- cbc, cmp, flp

## 2013-05-21 NOTE — Patient Instructions (Addendum)
Irritable Bowel Syndrome °Irritable Bowel Syndrome (IBS) is caused by a disturbance of normal bowel function. Other terms used are spastic colon, mucous colitis, and irritable colon. It does not require surgery, nor does it lead to cancer. There is no cure for IBS. But with proper diet, stress reduction, and medication, you will find that your problems (symptoms) will gradually disappear or improve. IBS is a common digestive disorder. It usually appears in late adolescence or early adulthood. Women develop it twice as often as men. °CAUSES  °After food has been digested and absorbed in the small intestine, waste material is moved into the colon (large intestine). In the colon, water and salts are absorbed from the undigested products coming from the small intestine. The remaining residue, or fecal material, is held for elimination. Under normal circumstances, gentle, rhythmic contractions on the bowel walls push the fecal material along the colon towards the rectum. In IBS, however, these contractions are irregular and poorly coordinated. The fecal material is either retained too long, resulting in constipation, or expelled too soon, producing diarrhea. °SYMPTOMS  °The most common symptom of IBS is pain. It is typically in the lower left side of the belly (abdomen). But it may occur anywhere in the abdomen. It can be felt as heartburn, backache, or even as a dull pain in the arms or shoulders. The pain comes from excessive bowel-muscle spasms and from the buildup of gas and fecal material in the colon. This pain: °· Can range from sharp belly (abdominal) cramps to a dull, continuous ache. °· Usually worsens soon after eating. °· Is typically relieved by having a bowel movement or passing gas. °Abdominal pain is usually accompanied by constipation. But it may also produce diarrhea. The diarrhea typically occurs right after a meal or upon arising in the morning. The stools are typically soft and watery. They are often  flecked with secretions (mucus). °Other symptoms of IBS include: °· Bloating. °· Loss of appetite. °· Heartburn. °· Feeling sick to your stomach (nausea). °· Belching °· Vomiting °· Gas. °IBS may also cause a number of symptoms that are unrelated to the digestive system: °· Fatigue. °· Headaches. °· Anxiety °· Shortness of breath °· Difficulty in concentrating. °· Dizziness. °These symptoms tend to come and go. °DIAGNOSIS  °The symptoms of IBS closely mimic the symptoms of other, more serious digestive disorders. So your caregiver may wish to perform a variety of additional tests to exclude these disorders. He/she wants to be certain of learning what is wrong (diagnosis). The nature and purpose of each test will be explained to you. °TREATMENT °A number of medications are available to help correct bowel function and/or relieve bowel spasms and abdominal pain. Among the drugs available are: °· Mild, non-irritating laxatives for severe constipation and to help restore normal bowel habits. °· Specific anti-diarrheal medications to treat severe or prolonged diarrhea. °· Anti-spasmodic agents to relieve intestinal cramps. °· Your caregiver may also decide to treat you with a mild tranquilizer or sedative during unusually stressful periods in your life. °The important thing to remember is that if any drug is prescribed for you, make sure that you take it exactly as directed. Make sure that your caregiver knows how well it worked for you. °HOME CARE INSTRUCTIONS  °· Avoid foods that are high in fat or oils. Some examples are:heavy cream, butter, frankfurters, sausage, and other fatty meats. °· Avoid foods that have a laxative effect, such as fruit, fruit juice, and dairy products. °· Cut out   carbonated drinks, chewing gum, and "gassy" foods, such as beans and cabbage. This may help relieve bloating and belching. °· Bran taken with plenty of liquids may help relieve constipation. °· Keep track of what foods seem to trigger  your symptoms. °· Avoid emotionally charged situations or circumstances that produce anxiety. °· Start or continue exercising. °· Get plenty of rest and sleep. °MAKE SURE YOU:  °· Understand these instructions. °· Will watch your condition. °· Will get help right away if you are not doing well or get worse. °Document Released: 10/23/2005 Document Revised: 01/15/2012 Document Reviewed: 06/12/2008 °[employer]® Patient Information ©2014 [employer]. ° °

## 2013-05-22 LAB — COMPREHENSIVE METABOLIC PANEL
ALT: 8 IU/L (ref 0–32)
AST: 18 IU/L (ref 0–40)
Albumin/Globulin Ratio: 2 (ref 1.1–2.5)
Calcium: 9.5 mg/dL (ref 8.6–10.2)
Chloride: 105 mmol/L (ref 97–108)
GFR calc non Af Amer: 39 mL/min/{1.73_m2} — ABNORMAL LOW (ref >59)
Potassium: 4.6 mmol/L (ref 3.5–5.2)
Sodium: 143 mmol/L (ref 134–144)
Total Bilirubin: 0.1 mg/dL (ref 0.0–1.2)

## 2013-05-22 LAB — CBC WITH DIFFERENTIAL/PLATELET
Basophils Absolute: 0 10*3/uL (ref 0.0–0.2)
Basos: 0 % (ref 0–3)
Eosinophils Absolute: 0.1 10*3/uL (ref 0.0–0.4)
Immature Grans (Abs): 0 10*3/uL (ref 0.0–0.1)
Immature Granulocytes: 0 % (ref 0–2)
Lymphs: 22 % (ref 14–46)
MCH: 29.9 pg (ref 26.6–33.0)
MCHC: 33 g/dL (ref 31.5–35.7)
MCV: 91 fL (ref 79–97)
Monocytes Absolute: 0.7 10*3/uL (ref 0.1–0.9)
Neutrophils Relative %: 68 % (ref 40–74)
RDW: 14.7 % (ref 12.3–15.4)

## 2013-05-22 LAB — LIPID PANEL
Cholesterol, Total: 165 mg/dL (ref 100–199)
HDL: 60 mg/dL (ref >39)

## 2013-06-24 ENCOUNTER — Other Ambulatory Visit: Payer: Self-pay | Admitting: Internal Medicine

## 2013-06-25 ENCOUNTER — Other Ambulatory Visit: Payer: Self-pay | Admitting: Geriatric Medicine

## 2013-06-25 ENCOUNTER — Ambulatory Visit: Payer: Medicare Other | Admitting: Internal Medicine

## 2013-07-22 ENCOUNTER — Other Ambulatory Visit: Payer: Self-pay | Admitting: Internal Medicine

## 2013-07-29 ENCOUNTER — Ambulatory Visit (INDEPENDENT_AMBULATORY_CARE_PROVIDER_SITE_OTHER): Payer: Medicare Other | Admitting: Internal Medicine

## 2013-07-29 ENCOUNTER — Encounter: Payer: Self-pay | Admitting: Internal Medicine

## 2013-07-29 VITALS — BP 140/72 | HR 60 | Temp 97.7°F | Resp 18

## 2013-07-29 DIAGNOSIS — I10 Essential (primary) hypertension: Secondary | ICD-10-CM

## 2013-07-29 DIAGNOSIS — Z23 Encounter for immunization: Secondary | ICD-10-CM

## 2013-07-29 DIAGNOSIS — R200 Anesthesia of skin: Secondary | ICD-10-CM

## 2013-07-29 DIAGNOSIS — R209 Unspecified disturbances of skin sensation: Secondary | ICD-10-CM | POA: Diagnosis not present

## 2013-07-29 DIAGNOSIS — I6529 Occlusion and stenosis of unspecified carotid artery: Secondary | ICD-10-CM | POA: Insufficient documentation

## 2013-07-29 MED ORDER — PENTAZOCINE-NALOXONE HCL 50-0.5 MG PO TABS: 1.0000 | ORAL_TABLET | Freq: Two times a day (BID) | ORAL | Status: DC | PRN

## 2013-07-29 NOTE — Progress Notes (Signed)
Patient ID: Brittany Hughes, female   DOB: [birth year], 79 y.o.   MRN: [id number]  Chief Complaint  Patient presents with  . Acute Visit    arm sleeping, feeling spacey    HPI Right hand and arm is going numb on her intermittently. This has been there for a month now and getting more prominent recently mainly early morning  But can happen any time of the day. She denies any pain She has been having funny feeling - feels like she has spaced out. Is having this intermittently for a month now since her last visit. Denies any extremity weakness. Denies any confusion following this. She sits down for sometime and then notices it to have resolved in a couple of minutes. She is not able to specify further. She has hx of several TIA She has hx of carotid stenosis and had undergone CEA. She is concerned about it being blocked again and this is worrying her  Gets tired easily  Review of Systems  Constitutional: Negative for fever, chills, weight loss and diaphoresis.  HENT: Positive for hearing loss. Negative for congestion.   Eyes: Negative for blurred vision.        Wears corrective lenses  Respiratory: Negative for cough and shortness of breath.   Cardiovascular: Negative for chest pain, palpitations, orthopnea and leg swelling.  Gastrointestinal: Negative for heartburn, vomiting,nausea, blood in stool, abdominal pain and constipation.  Genitourinary: Negative for dysuria, hematuria and flank pain.  Musculoskeletal: Negative for falls.  Skin: Negative for itching and rash.  Neurological: Positive for tingling and numbness in right arm and hand. Negative for speech change, seizures and headaches. No loss of consciousness, no dizziness Endo/Heme/Allergies: Does not bruise/bleed easily.  Psychiatric/Behavioral: Positive for depression and memory loss. The patient is nervous/anxious.    Past Medical History  Diagnosis Date  . Atrophy of vulva   . Dysuria   . Hyperpotassemia   . Unspecified  hereditary and idiopathic peripheral neuropathy   . Reflux esophagitis   . Osteoporosis, unspecified   . Hypertension   . Vitamin deficiency   . Unspecified malignant neoplasm of skin, site unspecified   . Chronic kidney disease   . Unspecified late effects of cerebrovascular disease   . Dermatophytosis of nail   . Other diseases of larynx   . Nausea alone   . Other B-complex deficiencies   . Generalized pain   . Mild cognitive impairment, so stated   . Other diseases of vocal cords   . Other voice and resonance disorders   . Depression   . Hyperlipidemia   . Occlusion and stenosis of carotid artery without mention of cerebral infarction   . Unspecified urinary incontinence   . Urinary frequency    Past Surgical History  Procedure Laterality Date  . Abdominal hysterectomy    . Stomach surgery    . Hemorrhoid surgery     Medication reviewed  Physical Exam   Reviewed vitals  Constitutional: She is oriented to person, place, and time. She appears well-developed and well-nourished. No distress.  HENT:   Head: Normocephalic and atraumatic.  Right Ear: External ear normal.  Left Ear: External ear normal.   Nose: Nose normal.   Mouth/Throat: Oropharynx is clear and moist. No oropharyngeal exudate.  Eyes: Conjunctivae and EOM are normal. Pupils are equal, round, and reactive to light. Right eye exhibits no discharge. Left eye exhibits no discharge. No scleral icterus.  Neck: Normal range of motion. Neck supple. No JVD present. No tracheal deviation  present. No thyromegaly present. No cervical tenderness Cardiovascular: Normal rate, regular rhythm, normal heart sounds and intact distal pulses.  Exam reveals no gallop and no friction rub.    No murmur heard.no carotid bruit appreciated Pulmonary/Chest: Effort normal and breath sounds normal. No stridor. No respiratory distress. She has no wheezes. She has no rales. She exhibits no tenderness.  Abdominal: Soft. Bowel sounds are  normal. She exhibits no distension and no mass. There is no tenderness. There is no rebound and no guarding.  Musculoskeletal: Normal range of motion. She exhibits no edema and no tenderness.  Lymphadenopathy:    She has no cervical adenopathy.  Neurological: She is alert and oriented to person, place, and time. She has normal reflexes. No cranial nerve deficit. Coordination normal.  Skin: Skin is warm. No rash noted. She is not diaphoretic. No erythema. No pallor.  Dry  Psychiatric: She has a normal mood and affect. Her behavior is normal. Judgment and thought content normal.    Assessment/Plan  Right hand numbness- concerns for radial nerve compression. Will get nerve conduction study to assess further for now  Carotid artery occlusion and stenosis- s/p CEA in past on right side and multiple TIAs. With the new symptoms, will get carotid doppler to rule out stenosis. Continue aggrenox and statin for now. No focal weakness or neurological deficit noted at present  Hypertension- continue current regimen of metoprolol and amlodipine and monitor bp. Monitor bmp.  Continue low salt intake and daily walking  counselled patient that if she has ? Spacing out or feeling weird feeling again, to report to the ED with concern for another TIA

## 2013-07-30 ENCOUNTER — Other Ambulatory Visit: Payer: Self-pay | Admitting: *Deleted

## 2013-07-30 ENCOUNTER — Ambulatory Visit
Admission: RE | Admit: 2013-07-30 | Discharge: 2013-07-30 | Disposition: A | Discharge status: Home / Self Care | Payer: Medicare Other | Source: Ambulatory Visit | Attending: Internal Medicine | Admitting: Internal Medicine

## 2013-07-30 DIAGNOSIS — I658 Occlusion and stenosis of other precerebral arteries: Secondary | ICD-10-CM | POA: Diagnosis not present

## 2013-07-30 MED ORDER — METOPROLOL SUCCINATE ER 25 MG PO TB24: ORAL_TABLET | ORAL | Status: DC

## 2013-08-04 ENCOUNTER — Telehealth: Payer: Self-pay | Admitting: *Deleted

## 2013-08-04 NOTE — Telephone Encounter (Signed)
Patient called about U/S results, asked Dr. Renato Gails to please advise. Patient was called and informed of results: Sm amount increased plaque in (R) where she had surgery,also small amount in the (L), neither one requires surgery. Dr. Renato Gails also advise to continue good BP and cholesterol control and follow-up with Dr. Glade Lloyd.

## 2013-08-06 ENCOUNTER — Telehealth: Payer: Self-pay | Admitting: *Deleted

## 2013-08-06 DIAGNOSIS — I779 Disorder of arteries and arterioles, unspecified: Secondary | ICD-10-CM

## 2013-08-06 NOTE — Telephone Encounter (Signed)
Dr. Deborah Chalk has retired. We could refer her to one of the other doctors in the [facility] cardiology group, if she would like.

## 2013-08-06 NOTE — Telephone Encounter (Signed)
Niece Nilsa Nutting) called requesting a referral for a cardiologist(Tenant, Duffy Rhody) to evaluate her sm blockage.

## 2013-08-07 NOTE — Telephone Encounter (Signed)
Spoke with patient's niece, ok with patient being set up with [facility]. Patients Niece aware of process with cariology referral, Dates patient available for referral: 08/08/13, 08/11/13, 08/12/13, 08/13/13, 08/18/13, 08/19/13, 08/21/2013 or any day after 08/21/13

## 2013-08-08 ENCOUNTER — Encounter: Payer: Self-pay | Admitting: Cardiology

## 2013-08-08 ENCOUNTER — Ambulatory Visit (INDEPENDENT_AMBULATORY_CARE_PROVIDER_SITE_OTHER): Payer: Medicare Other | Admitting: Cardiology

## 2013-08-08 VITALS — BP 167/72 | HR 68 | Ht 61.0 in | Wt 152.0 lb

## 2013-08-08 DIAGNOSIS — I6529 Occlusion and stenosis of unspecified carotid artery: Secondary | ICD-10-CM | POA: Diagnosis not present

## 2013-08-08 DIAGNOSIS — R209 Unspecified disturbances of skin sensation: Secondary | ICD-10-CM

## 2013-08-08 DIAGNOSIS — R200 Anesthesia of skin: Secondary | ICD-10-CM

## 2013-08-08 NOTE — Progress Notes (Signed)
HPI The patient presents for evaluation of a arm discomfort. She has no past cardiac history although she does have a history of carotid endarterectomy. She thinks she had a stress test many years ago. She's been getting progressive decreased exercise tolerance and dyspnea with exertion. Her niece who is with her today has noticed this over about 4 or 5 months. She'll get short of breath walking 20 yards on level ground. She is not describing PND or orthopnea.  She has been getting some right arm discomfort 2. This is independent of the dyspnea and seems to happen at rest. She has also had some pain under her left shoulder blade. She's had some episodes of dizziness and perhaps presyncope and was recently sent for carotid Doppler which demonstrated a patent right carotid endarterectomy with less than 50% narrowing and less than 50% narrowing on the left as well.    No Known Allergies  Current Outpatient Prescriptions  Medication Sig Dispense Refill  . amitriptyline (ELAVIL) 50 MG tablet Take 1 tablet (50 mg total) by mouth at bedtime. Take one tablet at bedtime  90 tablet  3  . amLODipine (NORVASC) 10 MG tablet TAKE 1 TABLET DAILY  90 tablet  3  . calcium carbonate (OS-CAL) 600 MG TABS Take 600 mg by mouth 2 (two) times daily with a meal. Take one tablet once in the morning and one in the evening      . dicyclomine (BENTYL) 10 MG capsule TAKE ONE CAPSULE BY MOUTH 4 TIMES A DAY BEFORE MEALS AND AT BEDTIME  120 capsule  1  . dipyridamole-aspirin (AGGRENOX) 200-25 MG per 12 hr capsule Take 1 capsule by mouth 2 (two) times daily. Take one tablet twice a day      . ergocalciferol (VITAMIN D2) 50000 UNITS capsule Take 50,000 Units by mouth once a week. Take one tablet once a week for vitamin d supplement      . fenofibrate 54 MG tablet Take one tablet once a day for cholesterol  90 tablet  3  . flavoxATE (URISPAS) 100 MG tablet Take 1 tablet (100 mg total) by mouth 3 (three) times daily as needed.  15  tablet  0  . gabapentin (NEURONTIN) 100 MG capsule Take 1 capsule (100 mg total) by mouth daily.  30 capsule  3  . metoprolol succinate (TOPROL-XL) 25 MG 24 hr tablet Take one tablet once a day for blood pressure  90 tablet  3  . omeprazole (PRILOSEC) 20 MG capsule TAKE 1 CAPSULE TWICE DAILY  60 capsule  2  . pentazocine-naloxone (TALWIN NX) 50-0.5 MG per tablet Take 1 tablet by mouth every 12 (twelve) hours as needed for pain.  90 tablet  5  . simvastatin (ZOCOR) 10 MG tablet Take one tablet once a day for cholesterol  90 tablet  3   No current facility-administered medications for this visit.    Past Medical History  Diagnosis Date  . Atrophy of vulva   . Dysuria   . Hyperpotassemia   . Unspecified hereditary and idiopathic peripheral neuropathy   . Reflux esophagitis   . Osteoporosis, unspecified   . Hypertension   . Vitamin deficiency   . Unspecified malignant neoplasm of skin, site unspecified   . Chronic kidney disease   . Dermatophytosis of nail   . Other diseases of larynx   . Other B-complex deficiencies   . Mild cognitive impairment, so stated   . Other voice and resonance disorders   . Depression   .  Hyperlipidemia   . Carotid stenosis   . Unspecified urinary incontinence   . Nephrolithiasis   . Macular degeneration     Past Surgical History  Procedure Laterality Date  . Abdominal hysterectomy    . Stomach surgery    . Hemorrhoid surgery    . Carotid endarterectomy      Right 2006    Family History  Problem Relation Age of Onset  . Heart disease Mother 71  . Kidney disease Father   . Diabetes Sister   . Heart disease Brother 17  . Heart attack Son 49  . Cancer Son     Throat  . Stroke Brother   . Cancer Brother   . Cancer Sister     Kidney    History   Social History  . Marital Status: Widowed    Spouse Name: N/A    Number of Children: N/A  . Years of Education: N/A   Occupational History  . Not on file.   Social History Main Topics  .  Smoking status: Former Smoker -- 0.50 packs/day for 60 years    Types: Cigarettes  . Smokeless tobacco: Not on file     Comment: Quit 2 years ago.   . Alcohol Use: No  . Drug Use: No  . Sexual Activity: Not on file   Other Topics Concern  . Not on file   Social History Narrative  . No narrative on file    ROS:  Positive for difficulty hearing, headaches, constipation and occasional diarrhea, hemorrhoids, reflux, urinary frequency and incontinence, occasional leg cramping.  PHYSICAL EXAM BP 167/72  Pulse 68  Ht 5\' 1"  (1.549 m)  Wt 152 lb (68.947 kg)  BMI 28.74 kg/m2 GENERAL:  Well appearing HEENT:  Pupils equal round and reactive, fundi not visualized, oral mucosa unremarkable NECK:  No jugular venous distention, waveform within normal limits, carotid upstroke brisk and symmetric, no bruits, no thyromegaly LYMPHATICS:  No cervical, inguinal adenopathy LUNGS:  Clear to auscultation bilaterally BACK:  No CVA tenderness CHEST:  Unremarkable HEART:  PMI not displaced or sustained,S1 and S2 within normal limits, no S3, no S4, no clicks, no rubs, no murmurs ABD:  Flat, positive bowel sounds normal in frequency in pitch, no bruits, no rebound, no guarding, no midline pulsatile mass, no hepatomegaly, no splenomegaly EXT:  2 plus pulses throughout, no edema, no cyanosis no clubbing SKIN:  No rashes no nodules NEURO:  Cranial nerves II through XII grossly intact, motor grossly intact throughout PSYCH:  Cognitively intact, oriented to person place and time   EKG:  Sinus rhythm, rate 68, axis within normal limits, intervals within normal limits, no acute ST-T wave changes. 08/08/2013  ASSESSMENT AND PLAN  ARM PAIN:  She has some typical and atypical features. She has significant cardiovascular risk factors. The pretest probability of obstructive coronary disease is moderate. I would like to screen her with an exercise treadmill test but she would not be a little walk on a treadmill.  Therefore, she will have a [employer].  HTN:  Her blood pressure is slightly elevated. I last her to keep a blood pressure diary and meds can be adjusted based on these readings. I will not make an adjustment based on the first reading in this office.  CAROTID STENOSIS:  This has been checked recently and I assume will be followed by [facility].  DYSPNEA:  This will be evaluated as above. I will see her back in followup to further consider this  if this gets worse despite having a normal stress perfusion study. I might consider a BNP level or echocardiogram.

## 2013-08-08 NOTE — Patient Instructions (Addendum)
The current medical regimen is effective;  continue present plan and medications.  Your physician has requested that you have a lexiscan myoview. For further information please visit [email or web address] Please follow instruction sheet, as given.  Follow up in 2 months with Dr Antoine Poche.

## 2013-08-27 ENCOUNTER — Encounter (HOSPITAL_COMMUNITY): Payer: Medicare Other

## 2013-09-10 ENCOUNTER — Ambulatory Visit: Payer: Medicare Other | Admitting: Internal Medicine

## 2013-09-10 ENCOUNTER — Encounter (HOSPITAL_COMMUNITY): Payer: Medicare Other

## 2013-10-08 ENCOUNTER — Ambulatory Visit (INDEPENDENT_AMBULATORY_CARE_PROVIDER_SITE_OTHER): Payer: Medicare Other | Admitting: Ophthalmology

## 2013-10-08 DIAGNOSIS — H43819 Vitreous degeneration, unspecified eye: Secondary | ICD-10-CM

## 2013-10-08 DIAGNOSIS — H35379 Puckering of macula, unspecified eye: Secondary | ICD-10-CM

## 2013-10-08 DIAGNOSIS — H35039 Hypertensive retinopathy, unspecified eye: Secondary | ICD-10-CM

## 2013-10-08 DIAGNOSIS — H3530 Unspecified macular degeneration: Secondary | ICD-10-CM

## 2013-10-08 DIAGNOSIS — I10 Essential (primary) hypertension: Secondary | ICD-10-CM | POA: Diagnosis not present

## 2013-10-15 ENCOUNTER — Ambulatory Visit: Payer: Medicare Other | Admitting: Cardiology

## 2013-10-28 ENCOUNTER — Ambulatory Visit: Payer: Medicare Other | Admitting: Internal Medicine

## 2013-11-04 ENCOUNTER — Ambulatory Visit: Payer: Medicare Other | Admitting: Internal Medicine

## 2013-11-12 ENCOUNTER — Ambulatory Visit (INDEPENDENT_AMBULATORY_CARE_PROVIDER_SITE_OTHER): Payer: Medicare Other | Admitting: Internal Medicine

## 2013-11-12 ENCOUNTER — Encounter: Payer: Self-pay | Admitting: Internal Medicine

## 2013-11-12 VITALS — BP 140/80 | HR 74 | Temp 98.3°F | Resp 10 | Ht 61.0 in | Wt 160.6 lb

## 2013-11-12 DIAGNOSIS — G8929 Other chronic pain: Secondary | ICD-10-CM

## 2013-11-12 DIAGNOSIS — F329 Major depressive disorder, single episode, unspecified: Secondary | ICD-10-CM

## 2013-11-12 DIAGNOSIS — K2100 Gastro-esophageal reflux disease with esophagitis, without bleeding: Secondary | ICD-10-CM | POA: Diagnosis not present

## 2013-11-12 DIAGNOSIS — G5790 Unspecified mononeuropathy of unspecified lower limb: Secondary | ICD-10-CM | POA: Insufficient documentation

## 2013-11-12 DIAGNOSIS — G5792 Unspecified mononeuropathy of left lower limb: Secondary | ICD-10-CM

## 2013-11-12 DIAGNOSIS — M1710 Unilateral primary osteoarthritis, unspecified knee: Secondary | ICD-10-CM

## 2013-11-12 DIAGNOSIS — M1712 Unilateral primary osteoarthritis, left knee: Secondary | ICD-10-CM

## 2013-11-12 DIAGNOSIS — IMO0002 Reserved for concepts with insufficient information to code with codable children: Secondary | ICD-10-CM

## 2013-11-12 DIAGNOSIS — E785 Hyperlipidemia, unspecified: Secondary | ICD-10-CM | POA: Diagnosis not present

## 2013-11-12 DIAGNOSIS — F3289 Other specified depressive episodes: Secondary | ICD-10-CM

## 2013-11-12 DIAGNOSIS — G629 Polyneuropathy, unspecified: Secondary | ICD-10-CM | POA: Insufficient documentation

## 2013-11-12 DIAGNOSIS — I10 Essential (primary) hypertension: Secondary | ICD-10-CM | POA: Diagnosis not present

## 2013-11-12 DIAGNOSIS — N183 Chronic kidney disease, stage 3 unspecified: Secondary | ICD-10-CM

## 2013-11-12 MED ORDER — METOPROLOL SUCCINATE ER 25 MG PO TB24: ORAL_TABLET | ORAL | Status: DC

## 2013-11-12 MED ORDER — AMLODIPINE BESYLATE 10 MG PO TABS: ORAL_TABLET | ORAL | Status: DC

## 2013-11-12 MED ORDER — OMEPRAZOLE 20 MG PO CPDR: DELAYED_RELEASE_CAPSULE | ORAL | Status: DC

## 2013-11-12 MED ORDER — AMITRIPTYLINE HCL 50 MG PO TABS: 50.0000 mg | ORAL_TABLET | Freq: Every day | ORAL | Status: DC

## 2013-11-12 MED ORDER — SIMVASTATIN 10 MG PO TABS: ORAL_TABLET | ORAL | Status: DC

## 2013-11-12 MED ORDER — FENOFIBRATE 54 MG PO TABS: ORAL_TABLET | ORAL | Status: DC

## 2013-11-12 MED ORDER — GABAPENTIN 100 MG PO CAPS: 100.0000 mg | ORAL_CAPSULE | Freq: Two times a day (BID) | ORAL | Status: DC

## 2013-11-12 NOTE — Progress Notes (Signed)
Patient ID: Brittany Hughes. Caputi, female   DOB: 1928-01-04, 80 y.o.   MRN: [id number]    Chief Complaint  Patient presents with  . Medical Managment of Chronic Issues    Follow-up, last OV 07/2013. Ongoing Neuropathy    No Known Allergies  HPI 80 y/o female pt here for follow up. Her leg pain had improved with gabapentin. She has ran out of it and her pain has started bothering her again. She also has been having pain in her left knee with the cold weather worsening it.  She has concern about change of her insurance and thus her pharmacy and is concerned about not getting her medication at time No other complaints  Review of Systems   Constitutional: Negative for fever, chills, weight loss and diaphoresis.   HENT: Positive for hearing loss. Negative for congestion.    Eyes: Negative for blurred vision.        Wears corrective lenses   Respiratory: Negative for cough and shortness of breath.    Cardiovascular: Negative for chest pain, palpitations, orthopnea and leg swelling.   Gastrointestinal: Negative for heartburn, vomiting,nausea, blood in stool, abdominal pain and constipation.   Genitourinary: Negative for dysuria, hematuria and flank pain.   Musculoskeletal: Negative for falls.   Skin: Negative for itching and rash.   Neurological: Positive for tingling and numbness with pain in left leg. Negative for speech change, seizures and headaches. No loss of consciousness, no dizziness Endo/Heme/Allergies: Does not bruise/bleed easily.   Psychiatric/Behavioral: Positive for depression and memory loss. The patient is nervous/anxious.  does not want any medication for her anxiety  Past Medical History  Diagnosis Date  . Atrophy of vulva   . Dysuria   . Hyperpotassemia   . Unspecified hereditary and idiopathic peripheral neuropathy   . Reflux esophagitis   . Osteoporosis, unspecified   . Hypertension   . Vitamin deficiency   . Unspecified malignant neoplasm of skin, site unspecified     . Chronic kidney disease   . Dermatophytosis of nail   . Other diseases of larynx   . Other B-complex deficiencies   . Mild cognitive impairment, so stated   . Other voice and resonance disorders   . Depression   . Hyperlipidemia   . Carotid stenosis   . Unspecified urinary incontinence   . Nephrolithiasis   . Macular degeneration     Medication reviewed. See [facility]   Physical exam BP 140/80  Pulse 74  Temp(Src) 98.3 F (36.8 C) (Oral)  Resp 10  Ht 5\' 1"  (1.549 m)  Wt 160 lb 9.6 oz (72.848 kg)  BMI 30.36 kg/m2  SpO2 99%  Constitutional: She is oriented to person, place, and time. She appears well-developed and well-nourished. No distress.   HENT:   Head: Normocephalic and atraumatic.  Right Ear: External ear normal.  Left Ear: External ear normal.   Nose: Nose normal.   Mouth/Throat: Oropharynx is clear and moist. No oropharyngeal exudate.   Eyes: Conjunctivae and EOM are normal. Pupils are equal, round, and reactive to light. Right eye exhibits no discharge. Left eye exhibits no discharge. No scleral icterus.   Neck: Normal range of motion. Neck supple. No JVD present. No tracheal deviation present. No thyromegaly present. No cervical tenderness Cardiovascular: Normal rate, regular rhythm, normal heart sounds and intact distal pulses.  Exam reveals no gallop and no friction rub.    No murmur heard.no carotid bruit appreciated Pulmonary/Chest: Effort normal and breath sounds normal. No stridor. No respiratory  distress. She has no wheezes. She has no rales. She exhibits no tenderness.   Abdominal: Soft. Bowel sounds are normal. She exhibits no distension and no mass. There is no tenderness. There is no rebound and no guarding.  Musculoskeletal: Normal range of motion. She exhibits no edema and no tenderness.  Lymphadenopathy:    She has no cervical adenopathy.  Neurological: She is alert and oriented to person, place, and time. She has normal reflexes. No cranial nerve  deficit. Coordination normal.   Skin: Skin is warm. No rash noted. She is not diaphoretic. No erythema. No pallor.  Dry  Psychiatric: She has a normal mood and affect. Her behavior is normal. Judgment and thought content normal.    Assessment/Plan  1. Neuropathy of thigh, left Will increase her gabapentin to 100 mg bid and monitor for now. Continue her elavil - gabapentin (NEURONTIN) 100 MG capsule; Take 1 capsule (100 mg total) by mouth 2 (two) times daily.  Dispense: 180 capsule; Refill: 1 - amitriptyline (ELAVIL) 50 MG tablet; Take 1 tablet (50 mg total) by mouth at bedtime. Take one tablet at bedtime  Dispense: 90 tablet; Refill: 1  2. Hyperlipidemia LDL goal < 100 Continue her current regimen. Refill provided - fenofibrate 54 MG tablet; Take one tablet once a day for cholesterol  Dispense: 90 tablet; Refill: 1 - simvastatin (ZOCOR) 10 MG tablet; Take one tablet once a day for cholesterol  Dispense: 90 tablet; Refill: 1  3. Essential hypertension, benign Stable bp readings in office. Continue current regimen. Check renal function today. Amlodipine and toprol to be continued - Basic Metabolic Panel  4. Reflux esophagitis Continue her PPI, monitor clinically, small portion meals  5. Chronic kidney disease, stage III (moderate) Avoid NSAIDS, check bmp. bp controlled  6. Other chronic pain Continue talwin prn, stable for now  7. Depressive disorder, not elsewhere classified Continue elavil for now, monitor clinically  8. Arthritis of left knee Continue her talwin as prescribed

## 2013-11-13 LAB — BASIC METABOLIC PANEL
BUN/Creatinine Ratio: 11 (ref 11–26)
BUN: 15 mg/dL (ref 8–27)
CO2: 25 mmol/L (ref 18–29)
Calcium: 9.2 mg/dL (ref 8.6–10.2)
Chloride: 102 mmol/L (ref 97–108)
Creatinine, Ser: 1.41 mg/dL — ABNORMAL HIGH (ref 0.57–1.00)
GFR, EST AFRICAN AMERICAN: 40 mL/min/{1.73_m2} — AB (ref >59)
GFR, EST NON AFRICAN AMERICAN: 34 mL/min/{1.73_m2} — AB (ref >59)
GLUCOSE: 68 mg/dL (ref 65–99)
POTASSIUM: 4.8 mmol/L (ref 3.5–5.2)
Sodium: 142 mmol/L (ref 134–144)

## 2013-11-30 ENCOUNTER — Other Ambulatory Visit: Payer: Self-pay | Admitting: Internal Medicine

## 2013-12-01 NOTE — Telephone Encounter (Signed)
thanks

## 2013-12-01 NOTE — Telephone Encounter (Signed)
Is this RX for Aggrenox okay to fill? Please advise. Thanks.

## 2013-12-01 NOTE — Telephone Encounter (Signed)
Ok to fill 

## 2013-12-18 ENCOUNTER — Ambulatory Visit: Payer: Medicare Other | Admitting: Nurse Practitioner

## 2013-12-22 DIAGNOSIS — N183 Chronic kidney disease, stage 3 unspecified: Secondary | ICD-10-CM | POA: Diagnosis not present

## 2013-12-22 DIAGNOSIS — R300 Dysuria: Secondary | ICD-10-CM | POA: Diagnosis not present

## 2013-12-22 DIAGNOSIS — N2581 Secondary hyperparathyroidism of renal origin: Secondary | ICD-10-CM | POA: Diagnosis not present

## 2013-12-25 ENCOUNTER — Other Ambulatory Visit: Payer: Self-pay | Admitting: Internal Medicine

## 2013-12-25 DIAGNOSIS — N183 Chronic kidney disease, stage 3 unspecified: Secondary | ICD-10-CM | POA: Diagnosis not present

## 2013-12-25 DIAGNOSIS — N039 Chronic nephritic syndrome with unspecified morphologic changes: Secondary | ICD-10-CM | POA: Diagnosis not present

## 2013-12-25 DIAGNOSIS — D631 Anemia in chronic kidney disease: Secondary | ICD-10-CM | POA: Diagnosis not present

## 2013-12-25 DIAGNOSIS — I129 Hypertensive chronic kidney disease with stage 1 through stage 4 chronic kidney disease, or unspecified chronic kidney disease: Secondary | ICD-10-CM | POA: Diagnosis not present

## 2013-12-25 DIAGNOSIS — N2581 Secondary hyperparathyroidism of renal origin: Secondary | ICD-10-CM | POA: Diagnosis not present

## 2013-12-30 ENCOUNTER — Ambulatory Visit: Payer: Medicare Other | Admitting: Internal Medicine

## 2013-12-31 ENCOUNTER — Ambulatory Visit (INDEPENDENT_AMBULATORY_CARE_PROVIDER_SITE_OTHER): Payer: Medicare Other | Admitting: Internal Medicine

## 2013-12-31 ENCOUNTER — Encounter: Payer: Self-pay | Admitting: Internal Medicine

## 2013-12-31 VITALS — BP 124/80 | HR 76 | Resp 10 | Wt 156.0 lb

## 2013-12-31 DIAGNOSIS — I70219 Atherosclerosis of native arteries of extremities with intermittent claudication, unspecified extremity: Secondary | ICD-10-CM

## 2013-12-31 DIAGNOSIS — I10 Essential (primary) hypertension: Secondary | ICD-10-CM | POA: Diagnosis not present

## 2013-12-31 DIAGNOSIS — G609 Hereditary and idiopathic neuropathy, unspecified: Secondary | ICD-10-CM | POA: Diagnosis not present

## 2013-12-31 DIAGNOSIS — G629 Polyneuropathy, unspecified: Secondary | ICD-10-CM

## 2013-12-31 NOTE — Progress Notes (Signed)
Patient ID: Brittany Hughes, female   DOB: February 07, 1928, 80 y.o.   MRN: [id number]     Chief Complaint  Patient presents with  . Leg Problem    leg pain  . Hand Problem  . Memory Loss    No Known Allergies  HPI 80 y/o female pt here for follow up. Her leg pain is recurring more frequently now. She had a mechanical fall 6 weeks back and since then has on and off leg pain. She has pain with walking and standing. No other complaints  Review of Systems   Constitutional: Negative for fever, chills, weight loss and diaphoresis.   HENT: Positive for hearing loss. Negative for congestion.    Eyes: Negative for blurred vision.        Wears corrective lenses   Respiratory: Negative for cough and shortness of breath.    Cardiovascular: Negative for chest pain, palpitations, orthopnea and leg swelling.   Gastrointestinal: Negative for heartburn, vomiting,nausea, blood in stool, abdominal pain and constipation.   Genitourinary: Negative for dysuria, hematuria and flank pain.   Musculoskeletal: Negative for falls.   Skin: Negative for itching and rash.   Neurological: Positive for tingling and numbness with pain in both leg- left leg more than right leg. Negative for speech change, seizures and headaches. No loss of consciousness, no dizziness Endo/Heme/Allergies: Does not bruise/bleed easily.   Psychiatric/Behavioral: Positive for depression and memory loss. The patient is nervous/anxious.  does not want any medication for her anxiety  Past Medical History  Diagnosis Date  . Atrophy of vulva   . Dysuria   . Hyperpotassemia   . Unspecified hereditary and idiopathic peripheral neuropathy   . Reflux esophagitis   . Osteoporosis, unspecified   . Hypertension   . Vitamin deficiency   . Unspecified malignant neoplasm of skin, site unspecified   . Chronic kidney disease   . Dermatophytosis of nail   . Other diseases of larynx   . Other B-complex deficiencies   . Mild cognitive impairment, so  stated   . Other voice and resonance disorders   . Depression   . Hyperlipidemia   . Carotid stenosis   . Unspecified urinary incontinence   . Nephrolithiasis   . Macular degeneration    Current Outpatient Prescriptions on File Prior to Visit  Medication Sig Dispense Refill  . AGGRENOX 25-200 MG per 12 hr capsule TAKE ONE CAPSULE BY MOUTH TWICE A DAY  180 capsule  3  . amitriptyline (ELAVIL) 50 MG tablet Take 1 tablet (50 mg total) by mouth at bedtime. Take one tablet at bedtime  90 tablet  1  . amLODipine (NORVASC) 10 MG tablet TAKE 1 TABLET DAILY  90 tablet  1  . calcium carbonate (OS-CAL) 600 MG TABS Take 600 mg by mouth 2 (two) times daily with a meal. Take one tablet once in the morning and one in the evening      . fenofibrate 54 MG tablet Take one tablet once a day for cholesterol  90 tablet  1  . gabapentin (NEURONTIN) 100 MG capsule Take 1 capsule (100 mg total) by mouth 2 (two) times daily.  180 capsule  1  . metoprolol succinate (TOPROL-XL) 25 MG 24 hr tablet Take one tablet once a day for blood pressure  90 tablet  1  . omeprazole (PRILOSEC) 20 MG capsule TAKE 1 CAPSULE TWICE DAILY  180 capsule  1  . pentazocine-naloxone (TALWIN NX) 50-0.5 MG per tablet TAKE 1 TABLET EVERY 8  HOURS AS NEEDED FOR PAIN  90 tablet  3  . promethazine (PHENERGAN) 25 MG tablet       . simvastatin (ZOCOR) 10 MG tablet Take one tablet once a day for cholesterol  90 tablet  1   No current facility-administered medications on file prior to visit.    Physical exam BP 124/80  Pulse 76  Resp 10  Wt 156 lb (70.761 kg)  Constitutional: She is oriented to person, place, and time. She appears well-developed and well-nourished. No distress.   HENT:   Head: Normocephalic and atraumatic.  Right Ear: External ear normal.  Left Ear: External ear normal.   Nose: Nose normal.   Mouth/Throat: Oropharynx is clear and moist. No oropharyngeal exudate.   Eyes: Conjunctivae and EOM are normal. Pupils are equal,  round, and reactive to light. Right eye exhibits no discharge. Left eye exhibits no discharge. No scleral icterus.   Neck: Normal range of motion. Neck supple. No JVD present. No tracheal deviation present. No thyromegaly present. No cervical tenderness Cardiovascular: Normal rate, regular rhythm, normal heart sounds and intact distal pulses.  Exam reveals no gallop and no friction rub.    No murmur heard.no carotid bruit appreciated Pulmonary/Chest: Effort normal and breath sounds normal. No stridor. No respiratory distress. She has no wheezes. She has no rales. She exhibits no tenderness.   Abdominal: Soft. Bowel sounds are normal. She exhibits no distension and no mass. There is no tenderness. There is no rebound and no guarding.  Musculoskeletal: Normal range of motion. She exhibits no edema and no tenderness.  Lymphadenopathy:    She has no cervical adenopathy.  Neurological: She is alert and oriented to person, place, and time. She has normal reflexes. No cranial nerve deficit. Coordination normal.   Skin: Skin is warm. No rash noted. She is not diaphoretic. No erythema. No pallor. Dry skin.  Psychiatric: She has a normal mood and affect. Her behavior is normal. Judgment and thought content normal.    Assessment/Plan  1. intermittent claudication - possible atherosclerotic vs neurogenic. Continue gabapentin for now - Ankle brachial index; Future  2. Essential hypertension, benign Stable. Amlodipine and toprol to be continued  3. Peripheral neuropathy Continue gabapentin and amitriptyline

## 2014-01-04 ENCOUNTER — Other Ambulatory Visit: Payer: Self-pay | Admitting: Internal Medicine

## 2014-01-06 ENCOUNTER — Ambulatory Visit (INDEPENDENT_AMBULATORY_CARE_PROVIDER_SITE_OTHER): Payer: Medicare Other | Admitting: Family Medicine

## 2014-01-06 ENCOUNTER — Ambulatory Visit: Payer: Medicare Other

## 2014-01-06 ENCOUNTER — Ambulatory Visit (HOSPITAL_COMMUNITY)
Admission: RE | Admit: 2014-01-06 | Discharge: 2014-01-06 | Disposition: A | Discharge status: Home / Self Care | Payer: Medicare Other | Source: Ambulatory Visit | Attending: Family Medicine | Admitting: Family Medicine

## 2014-01-06 ENCOUNTER — Encounter (HOSPITAL_COMMUNITY): Payer: Self-pay | Admitting: General Practice

## 2014-01-06 ENCOUNTER — Inpatient Hospital Stay (HOSPITAL_COMMUNITY)
Admission: AD | Admit: 2014-01-06 | Discharge: 2014-01-09 | DRG: 301 | Disposition: A | Discharge status: Skilled Nursing Facility | Payer: Medicare Other | Source: Ambulatory Visit | Attending: Internal Medicine | Admitting: Internal Medicine

## 2014-01-06 VITALS — BP 130/60 | HR 67 | Temp 98.1°F | Resp 18 | Ht 61.4 in | Wt 153.6 lb

## 2014-01-06 DIAGNOSIS — M810 Age-related osteoporosis without current pathological fracture: Secondary | ICD-10-CM | POA: Diagnosis present

## 2014-01-06 DIAGNOSIS — K2100 Gastro-esophageal reflux disease with esophagitis, without bleeding: Secondary | ICD-10-CM

## 2014-01-06 DIAGNOSIS — I739 Peripheral vascular disease, unspecified: Secondary | ICD-10-CM | POA: Diagnosis not present

## 2014-01-06 DIAGNOSIS — R200 Anesthesia of skin: Secondary | ICD-10-CM

## 2014-01-06 DIAGNOSIS — R202 Paresthesia of skin: Secondary | ICD-10-CM

## 2014-01-06 DIAGNOSIS — E538 Deficiency of other specified B group vitamins: Secondary | ICD-10-CM

## 2014-01-06 DIAGNOSIS — J387 Other diseases of larynx: Secondary | ICD-10-CM

## 2014-01-06 DIAGNOSIS — I82409 Acute embolism and thrombosis of unspecified deep veins of unspecified lower extremity: Principal | ICD-10-CM

## 2014-01-06 DIAGNOSIS — G609 Hereditary and idiopathic neuropathy, unspecified: Secondary | ICD-10-CM

## 2014-01-06 DIAGNOSIS — M25539 Pain in unspecified wrist: Secondary | ICD-10-CM

## 2014-01-06 DIAGNOSIS — M79609 Pain in unspecified limb: Secondary | ICD-10-CM

## 2014-01-06 DIAGNOSIS — G8929 Other chronic pain: Secondary | ICD-10-CM

## 2014-01-06 DIAGNOSIS — M79661 Pain in right lower leg: Secondary | ICD-10-CM

## 2014-01-06 DIAGNOSIS — R296 Repeated falls: Secondary | ICD-10-CM

## 2014-01-06 DIAGNOSIS — I6990 Unspecified sequelae of unspecified cerebrovascular disease: Secondary | ICD-10-CM

## 2014-01-06 DIAGNOSIS — F3289 Other specified depressive episodes: Secondary | ICD-10-CM | POA: Diagnosis present

## 2014-01-06 DIAGNOSIS — H3530 Unspecified macular degeneration: Secondary | ICD-10-CM | POA: Diagnosis present

## 2014-01-06 DIAGNOSIS — I10 Essential (primary) hypertension: Secondary | ICD-10-CM | POA: Diagnosis not present

## 2014-01-06 DIAGNOSIS — K219 Gastro-esophageal reflux disease without esophagitis: Secondary | ICD-10-CM

## 2014-01-06 DIAGNOSIS — N905 Atrophy of vulva: Secondary | ICD-10-CM

## 2014-01-06 DIAGNOSIS — F03A0 Unspecified dementia, mild, without behavioral disturbance, psychotic disturbance, mood disturbance, and anxiety: Secondary | ICD-10-CM

## 2014-01-06 DIAGNOSIS — S52609A Unspecified fracture of lower end of unspecified ulna, initial encounter for closed fracture: Secondary | ICD-10-CM | POA: Diagnosis not present

## 2014-01-06 DIAGNOSIS — N189 Chronic kidney disease, unspecified: Secondary | ICD-10-CM | POA: Diagnosis present

## 2014-01-06 DIAGNOSIS — N390 Urinary tract infection, site not specified: Secondary | ICD-10-CM

## 2014-01-06 DIAGNOSIS — M25531 Pain in right wrist: Secondary | ICD-10-CM

## 2014-01-06 DIAGNOSIS — F329 Major depressive disorder, single episode, unspecified: Secondary | ICD-10-CM | POA: Diagnosis present

## 2014-01-06 DIAGNOSIS — IMO0001 Reserved for inherently not codable concepts without codable children: Secondary | ICD-10-CM | POA: Diagnosis not present

## 2014-01-06 DIAGNOSIS — S52509A Unspecified fracture of the lower end of unspecified radius, initial encounter for closed fracture: Secondary | ICD-10-CM

## 2014-01-06 DIAGNOSIS — I129 Hypertensive chronic kidney disease with stage 1 through stage 4 chronic kidney disease, or unspecified chronic kidney disease: Secondary | ICD-10-CM | POA: Diagnosis present

## 2014-01-06 DIAGNOSIS — Z0000 Encounter for general adult medical examination without abnormal findings: Secondary | ICD-10-CM

## 2014-01-06 DIAGNOSIS — Z5189 Encounter for other specified aftercare: Secondary | ICD-10-CM | POA: Diagnosis not present

## 2014-01-06 DIAGNOSIS — G3184 Mild cognitive impairment, so stated: Secondary | ICD-10-CM

## 2014-01-06 DIAGNOSIS — Z9181 History of falling: Secondary | ICD-10-CM

## 2014-01-06 DIAGNOSIS — F0390 Unspecified dementia without behavioral disturbance: Secondary | ICD-10-CM | POA: Diagnosis not present

## 2014-01-06 DIAGNOSIS — G5790 Unspecified mononeuropathy of unspecified lower limb: Secondary | ICD-10-CM

## 2014-01-06 DIAGNOSIS — I6529 Occlusion and stenosis of unspecified carotid artery: Secondary | ICD-10-CM

## 2014-01-06 DIAGNOSIS — E559 Vitamin D deficiency, unspecified: Secondary | ICD-10-CM

## 2014-01-06 DIAGNOSIS — W19XXXA Unspecified fall, initial encounter: Secondary | ICD-10-CM | POA: Diagnosis present

## 2014-01-06 DIAGNOSIS — R5381 Other malaise: Secondary | ICD-10-CM | POA: Diagnosis present

## 2014-01-06 DIAGNOSIS — J383 Other diseases of vocal cords: Secondary | ICD-10-CM

## 2014-01-06 DIAGNOSIS — L989 Disorder of the skin and subcutaneous tissue, unspecified: Secondary | ICD-10-CM

## 2014-01-06 DIAGNOSIS — S62109A Fracture of unspecified carpal bone, unspecified wrist, initial encounter for closed fracture: Secondary | ICD-10-CM | POA: Diagnosis not present

## 2014-01-06 DIAGNOSIS — B351 Tinea unguium: Secondary | ICD-10-CM

## 2014-01-06 DIAGNOSIS — J069 Acute upper respiratory infection, unspecified: Secondary | ICD-10-CM

## 2014-01-06 DIAGNOSIS — S62101A Fracture of unspecified carpal bone, right wrist, initial encounter for closed fracture: Secondary | ICD-10-CM

## 2014-01-06 DIAGNOSIS — N183 Chronic kidney disease, stage 3 unspecified: Secondary | ICD-10-CM

## 2014-01-06 DIAGNOSIS — K529 Noninfective gastroenteritis and colitis, unspecified: Secondary | ICD-10-CM

## 2014-01-06 DIAGNOSIS — R682 Dry mouth, unspecified: Secondary | ICD-10-CM

## 2014-01-06 DIAGNOSIS — E785 Hyperlipidemia, unspecified: Secondary | ICD-10-CM | POA: Diagnosis not present

## 2014-01-06 DIAGNOSIS — G629 Polyneuropathy, unspecified: Secondary | ICD-10-CM

## 2014-01-06 DIAGNOSIS — H908 Mixed conductive and sensorineural hearing loss, unspecified: Secondary | ICD-10-CM

## 2014-01-06 DIAGNOSIS — M1712 Unilateral primary osteoarthritis, left knee: Secondary | ICD-10-CM

## 2014-01-06 DIAGNOSIS — C4490 Unspecified malignant neoplasm of skin, unspecified: Secondary | ICD-10-CM

## 2014-01-06 DIAGNOSIS — F17200 Nicotine dependence, unspecified, uncomplicated: Secondary | ICD-10-CM

## 2014-01-06 DIAGNOSIS — I82401 Acute embolism and thrombosis of unspecified deep veins of right lower extremity: Secondary | ICD-10-CM | POA: Diagnosis present

## 2014-01-06 DIAGNOSIS — Z23 Encounter for immunization: Secondary | ICD-10-CM

## 2014-01-06 DIAGNOSIS — Z87891 Personal history of nicotine dependence: Secondary | ICD-10-CM

## 2014-01-06 DIAGNOSIS — K117 Disturbances of salivary secretion: Secondary | ICD-10-CM

## 2014-01-06 HISTORY — DX: Gastro-esophageal reflux disease without esophagitis: K21.9

## 2014-01-06 HISTORY — DX: Unspecified convulsions: R56.9

## 2014-01-06 HISTORY — DX: Acute embolism and thrombosis of unspecified deep veins of unspecified lower extremity: I82.409

## 2014-01-06 HISTORY — DX: Fracture of unspecified carpal bone, right wrist, initial encounter for closed fracture: S62.101A

## 2014-01-06 LAB — BASIC METABOLIC PANEL
BUN: 20 mg/dL (ref 6–23)
CHLORIDE: 104 meq/L (ref 96–112)
CO2: 21 meq/L (ref 19–32)
Calcium: 9.2 mg/dL (ref 8.4–10.5)
Creatinine, Ser: 1.44 mg/dL — ABNORMAL HIGH (ref 0.50–1.10)
GFR, EST AFRICAN AMERICAN: 38 mL/min — AB (ref >90)
GFR, EST NON AFRICAN AMERICAN: 33 mL/min — AB (ref >90)
Glucose, Bld: 144 mg/dL — ABNORMAL HIGH (ref 70–99)
POTASSIUM: 4.3 meq/L (ref 3.7–5.3)
SODIUM: 141 meq/L (ref 137–147)

## 2014-01-06 LAB — CBC
HCT: 34.3 % — ABNORMAL LOW (ref 36.0–46.0)
Hemoglobin: 11.3 g/dL — ABNORMAL LOW (ref 12.0–15.0)
MCH: 30.5 pg (ref 26.0–34.0)
MCHC: 32.9 g/dL (ref 30.0–36.0)
MCV: 92.5 fL (ref 78.0–100.0)
Platelets: 182 10*3/uL (ref 150–400)
RBC: 3.71 MIL/uL — ABNORMAL LOW (ref 3.87–5.11)
RDW: 14.2 % (ref 11.5–15.5)
WBC: 7.9 10*3/uL (ref 4.0–10.5)

## 2014-01-06 LAB — ANTITHROMBIN III: AntiThromb III Func: 96 % (ref 75–120)

## 2014-01-06 LAB — APTT: APTT: 28 s (ref 24–37)

## 2014-01-06 LAB — PROTIME-INR
INR: 1.04 (ref 0.00–1.49)
Prothrombin Time: 13.4 seconds (ref 11.6–15.2)

## 2014-01-06 MED ORDER — ENOXAPARIN SODIUM 80 MG/0.8ML ~~LOC~~ SOLN
70.0000 mg | SUBCUTANEOUS | Status: DC
  Administered 2014-01-06 – 2014-01-08 (×3): 70 mg via SUBCUTANEOUS
  Filled 2014-01-06 (×4): qty 0.8

## 2014-01-06 MED ORDER — CALCIUM CARBONATE 1250 (500 CA) MG PO TABS
1.0000 | ORAL_TABLET | Freq: Every day | ORAL | Status: DC
  Administered 2014-01-07 – 2014-01-09 (×3): 500 mg via ORAL
  Filled 2014-01-06 (×4): qty 1

## 2014-01-06 MED ORDER — AMLODIPINE BESYLATE 10 MG PO TABS
10.0000 mg | ORAL_TABLET | Freq: Every day | ORAL | Status: DC
  Administered 2014-01-07 – 2014-01-09 (×3): 10 mg via ORAL
  Filled 2014-01-06 (×3): qty 1

## 2014-01-06 MED ORDER — ACETAMINOPHEN 650 MG RE SUPP: 650.0000 mg | Freq: Four times a day (QID) | RECTAL | Status: DC | PRN

## 2014-01-06 MED ORDER — ACETAMINOPHEN 325 MG PO TABS: 650.0000 mg | ORAL_TABLET | Freq: Four times a day (QID) | ORAL | Status: DC | PRN

## 2014-01-06 MED ORDER — PANTOPRAZOLE SODIUM 40 MG PO TBEC
40.0000 mg | DELAYED_RELEASE_TABLET | Freq: Every day | ORAL | Status: DC
  Administered 2014-01-07 – 2014-01-09 (×3): 40 mg via ORAL
  Filled 2014-01-06 (×2): qty 1

## 2014-01-06 MED ORDER — FENOFIBRATE 54 MG PO TABS
54.0000 mg | ORAL_TABLET | Freq: Every day | ORAL | Status: DC
  Administered 2014-01-07 – 2014-01-09 (×3): 54 mg via ORAL
  Filled 2014-01-06 (×3): qty 1

## 2014-01-06 MED ORDER — PENTAZOCINE-NALOXONE HCL 50-0.5 MG PO TABS
1.0000 | ORAL_TABLET | ORAL | Status: DC | PRN
  Administered 2014-01-06 – 2014-01-09 (×4): 1 via ORAL
  Filled 2014-01-06 (×4): qty 1

## 2014-01-06 MED ORDER — SENNOSIDES-DOCUSATE SODIUM 8.6-50 MG PO TABS
1.0000 | ORAL_TABLET | Freq: Every evening | ORAL | Status: DC | PRN
  Administered 2014-01-07: 1 via ORAL
  Filled 2014-01-06: qty 1

## 2014-01-06 MED ORDER — SIMVASTATIN 10 MG PO TABS
10.0000 mg | ORAL_TABLET | Freq: Every day | ORAL | Status: DC
  Administered 2014-01-06 – 2014-01-08 (×3): 10 mg via ORAL
  Filled 2014-01-06 (×4): qty 1

## 2014-01-06 MED ORDER — ASPIRIN-DIPYRIDAMOLE ER 25-200 MG PO CP12
1.0000 | ORAL_CAPSULE | Freq: Every day | ORAL | Status: DC
  Administered 2014-01-07 – 2014-01-09 (×3): 1 via ORAL
  Filled 2014-01-06 (×3): qty 1

## 2014-01-06 MED ORDER — CALCIUM CARBONATE 600 MG PO TABS: 600.0000 mg | ORAL_TABLET | Freq: Two times a day (BID) | ORAL | Status: DC

## 2014-01-06 MED ORDER — PROMETHAZINE HCL 25 MG PO TABS
12.5000 mg | ORAL_TABLET | Freq: Four times a day (QID) | ORAL | Status: DC | PRN
  Administered 2014-01-08: 12.5 mg via ORAL
  Filled 2014-01-06: qty 1

## 2014-01-06 MED ORDER — AMITRIPTYLINE HCL 50 MG PO TABS
50.0000 mg | ORAL_TABLET | Freq: Every day | ORAL | Status: DC
  Administered 2014-01-06 – 2014-01-08 (×3): 50 mg via ORAL
  Filled 2014-01-06 (×4): qty 1

## 2014-01-06 MED ORDER — GABAPENTIN 100 MG PO CAPS
100.0000 mg | ORAL_CAPSULE | Freq: Two times a day (BID) | ORAL | Status: DC
  Administered 2014-01-06 – 2014-01-09 (×6): 100 mg via ORAL
  Filled 2014-01-06 (×7): qty 1

## 2014-01-06 MED ORDER — DOCUSATE SODIUM 100 MG PO CAPS
100.0000 mg | ORAL_CAPSULE | Freq: Two times a day (BID) | ORAL | Status: DC
  Administered 2014-01-06 – 2014-01-09 (×6): 100 mg via ORAL
  Filled 2014-01-06 (×9): qty 1

## 2014-01-06 MED ORDER — METOPROLOL SUCCINATE ER 25 MG PO TB24
25.0000 mg | ORAL_TABLET | Freq: Every day | ORAL | Status: DC
  Administered 2014-01-07 – 2014-01-09 (×3): 25 mg via ORAL
  Filled 2014-01-06 (×3): qty 1

## 2014-01-06 NOTE — Progress Notes (Signed)
This chart was scribed for Robyn Haber, MD  by Stacy Gardner, [facility] Scribe. The patient was seen in room and the patient's care was started at 12:39 PM.  HPI Comments: Brittany Hughes is a 80 y.o. right handed female who arrives to the [facility] complaining of right arm constant, moderate pain that occurred three nights ago after a fall. As she was taking off her pains she fell forward and "flipped across" into the hallway. She states she hit the doorway. Pt did not tell anyone about the fall until the next morning. Pt mentions that the pain is worse near the lateral portion of her forearm. The site is swollen and painful to the touch. She states that she fell prior the fall that occurred two days ago, she had another fall. Pt mentions that she has an appointe ment to have her left leg checked for blood clots in a two days. She has skin darkening near the lateral portion of her left leg. Denies pain at baseline to her left leg but states the pain is worse with bearing weight. Denies chest pain.   Review of Systems  Musculoskeletal: Positive for falls, joint pain and myalgias.    Past Medical History  Diagnosis Date   Atrophy of vulva    Dysuria    Hyperpotassemia    Unspecified hereditary and idiopathic peripheral neuropathy    Reflux esophagitis    Osteoporosis, unspecified    Hypertension    Vitamin deficiency    Unspecified malignant neoplasm of skin, site unspecified    Chronic kidney disease    Dermatophytosis of nail    Other diseases of larynx    Other B-complex deficiencies    Mild cognitive impairment, so stated    Other voice and resonance disorders    Depression    Hyperlipidemia    Carotid stenosis    Unspecified urinary incontinence    Nephrolithiasis    Macular degeneration   No Known Allergies Family History  Problem Relation Age of Onset   Heart disease Mother 21   Kidney disease Father     Diabetes Sister    Heart disease Brother 3   Heart attack Son 79   Cancer Son     Throat   Stroke Brother    Cancer Brother    Cancer Sister     Kidney   patient is right hand Patient's sister brought her in. Patient's sister will be going out of town next week but says that her patient is well connected in the church and has many friends will come and bring her meals and help take care of her while she is away.  Objective: A [age]-year-old elderly woman in no acute distress HEENT: Atraumatic Chest: Clear Examination of the right upper extremity reveals swelling and tenderness over the distal radius with decreased range of motion secondary to pain. There is mild ecchymosis underneath the radial aspect of the wrist joint.  Examination of the right calf reveals mild swelling minimal tenderness and no edema.  [facility] reading (PRIMARY) by  Dr. Joseph Art: Small crack at the metaphysis of the right radius with no displacement.  Assessment: Patient has right Colles' fracture, nondisplaced. She'll need stabilization with a sugar tong splint. In addition patient has suggestion of a DVT in her calf.  Plan: Followup for the wrist and 5 days, venous Doppler today. Tramadol 50 mg when necessary  Signed Robyn Haber M.D..

## 2014-01-06 NOTE — Progress Notes (Signed)
*  PRELIMINARY RESULTS* Vascular Ultrasound Right lower extremity Venous and Arterial duplex has been completed.  Preliminary findings:   Venous =Acute DVT involving the right posterior tibial veins. Arterial= >50% stenosis of the proximal right popliteal artery with monophasic flow distally.    Called report to Dr. Laney Pastor, as Dr. Joseph Art was not available.    Landry Mellow, RDMS, RVT  01/06/2014, 5:24 PM

## 2014-01-06 NOTE — H&P (Signed)
[facility] History and Physical  Brittany Hughes QVZ:[id number] DOB: [birth year] DOA: 01/06/2014  Referring physician: Laney Pastor PCP: Blanchie Serve, MD   Chief Complaint: right leg DVT  HPI: Brittany Hughes is a 80 y.o. female  Directly admitted after doppler showed right leg DVT posterior tibial.  Fell several months ago and has had right leg pain since then. No edema. No CP, SHOB or palpitations. No immobility, long trips, surgery.  Has fallen multiple times recently. Fractured right wrist several days ago, splinted and sling.  Lives alone. Family has been talking about placement. Also h/o mild dementia and gets confused occasionally   Review of Systems:  Systems reviewed. As above, otherwise negative  Past Medical History  Diagnosis Date  . Atrophy of vulva   . Dysuria   . Hyperpotassemia   . Unspecified hereditary and idiopathic peripheral neuropathy   . Reflux esophagitis   . Osteoporosis, unspecified   . Hypertension   . Vitamin deficiency   . Unspecified malignant neoplasm of skin, site unspecified   . Chronic kidney disease   . Dermatophytosis of nail   . Other diseases of larynx   . Other B-complex deficiencies   . Mild cognitive impairment, so stated   . Other voice and resonance disorders   . Depression   . Hyperlipidemia   . Carotid stenosis   . Unspecified urinary incontinence   . Nephrolithiasis   . Macular degeneration    Past Surgical History  Procedure Laterality Date  . Abdominal hysterectomy    . Stomach surgery    . Hemorrhoid surgery    . Carotid endarterectomy      Right 2006   Social History:  reports that she quit smoking about 2 years ago. Her smoking use included Cigarettes. She has a 30 pack-year smoking history. She does not have any smokeless tobacco history on file. She reports that she does not drink alcohol or use illicit drugs.  No Known Allergies  Family History  Problem Relation Age of Onset  . Heart disease Mother 76  .  Kidney disease Father   . Diabetes Sister   . Heart disease Brother 72  . Heart attack Son 67  . Cancer Son     Throat  . Stroke Brother   . Cancer Brother   . Cancer Sister     Kidney     Prior to Admission medications   Medication Sig Start Date End Date Taking? Authorizing Provider  AGGRENOX 25-200 MG per 12 hr capsule TAKE ONE CAPSULE BY MOUTH TWICE A DAY 11/30/13   Blanchie Serve, MD  amitriptyline (ELAVIL) 50 MG tablet Take 1 tablet (50 mg total) by mouth at bedtime. Take one tablet at bedtime 11/12/13   Blanchie Serve, MD  amLODipine (NORVASC) 10 MG tablet TAKE 1 TABLET DAILY 11/12/13   Blanchie Serve, MD  calcium carbonate (OS-CAL) 600 MG TABS Take 600 mg by mouth 2 (two) times daily with a meal. Take one tablet once in the morning and one in the evening    Historical Provider, MD  fenofibrate 54 MG tablet Take one tablet once a day for cholesterol 11/12/13   Blanchie Serve, MD  gabapentin (NEURONTIN) 100 MG capsule Take 1 capsule (100 mg total) by mouth 2 (two) times daily. 11/12/13   Blanchie Serve, MD  metoprolol succinate (TOPROL-XL) 25 MG 24 hr tablet Take one tablet once a day for blood pressure 11/12/13   Blanchie Serve, MD  omeprazole (PRILOSEC) 20 MG capsule TAKE 1  CAPSULE TWICE DAILY 11/12/13   Mahima Bubba Camp, MD  pentazocine-naloxone (TALWIN NX) 50-0.5 MG per tablet TAKE 1 TABLET EVERY 8 HOURS AS NEEDED FOR PAIN    Blanchie Serve, MD  promethazine (PHENERGAN) 25 MG tablet  09/08/13   Historical Provider, MD  simvastatin (ZOCOR) 10 MG tablet Take one tablet once a day for cholesterol 11/12/13   Blanchie Serve, MD   Physical Exam: Filed Vitals:   01/06/14 1755  BP: 179/63  Pulse: 66  Temp: 98.7 F (37.1 C)  Resp: 18    BP 179/63  Pulse 66  Temp(Src) 98.7 F (37.1 C) (Oral)  Resp 18  Ht 5' 1.4" (1.56 m)  Wt 68.6 kg (151 lb 3.8 oz)  BMI 28.19 kg/m2  SpO2 95%  BP 136/60  Pulse 68  Temp(Src) 98.7 F (37.1 C) (Oral)  Resp 18  Ht 5' 1.4" (1.56 m)  Wt 68.6 kg (151 lb 3.8 oz)   BMI 28.19 kg/m2  SpO2 98%  General Appearance:    Alert, cooperative, no distress, appears stated age  Head:    Normocephalic, without obvious abnormality, atraumatic  Eyes:    PERRL, conjunctiva/corneas clear, EOM's intact, fundi    benign, both eyes     Nose:   Nares normal, septum midline, mucosa normal, no drainage    or sinus tenderness  Throat:   Lips, mucosa, and tongue normal; teeth and gums normal  Neck:   Supple, symmetrical, trachea midline, no adenopathy;    thyroid:  no enlargement/tenderness/nodules; no carotid   bruit or JVD  Back:     Symmetric, no curvature, ROM normal, no CVA tenderness  Lungs:     Clear to auscultation bilaterally, respirations unlabored  Chest Wall:    No tenderness or deformity   Heart:    Regular rate and rhythm, S1 and S2 normal, no murmur, rub   or gallop     Abdomen:     Soft, non-tender, bowel sounds active all four quadrants,    no masses, no organomegaly  Genitalia:   deferred  Rectal:   deferred  Extremities:   Right arm in a splint, sling. Hand edematous.  Right calf tender  Pulses:   2+ and symmetric all extremities  Skin:   Skin color, texture, turgor normal, no rashes or lesions  Lymph nodes:   Cervical, supraclavicular, and axillary nodes normal  Neurologic:   CNII-XII intact, normal strength, sensation and reflexes    throughout           Psych normal affect  Labs: none Doppler right popliteal DVT  Radiological Exams on Admission: Dg Wrist Complete Right  01/06/2014   CLINICAL DATA:  Right wrist pain after fall.  EXAM: RIGHT WRIST - COMPLETE 3+ VIEW  COMPARISON:  None.  FINDINGS: Minimally displaced fracture is seen involving the distal radial metaphysis. Mildly displaced ulnar styloid fracture is noted as well.  IMPRESSION: Minimally displaced distal radial fracture. Mildly displaced ulnar styloid fracture.   Electronically Signed   By: Sabino Dick M.D.   On: 01/06/2014 14:55    Assessment/Plan acute right leg DVT (deep  venous thrombosis):  Unprovoked. Check hypercoaguable panel. CBC, BMET. Give lovenox. If creat ok, consider xarelto. Active Problems:   Unspecified essential hypertension   Essential hypertension, benign   Peripheral neuropathy   Hyperlipidemia LDL goal < 100   Mild dementia   Right wrist fracture: continue splint and sling.   GERD (gastroesophageal reflux disease)   Frequent falls  No longer safe to  live alone. PT, OT eval. Placement.  Code Status: full Family Communication: sister at bedside Disposition Plan: SNF?  Time spent: 60 min  [facility] Pager [phone]

## 2014-01-06 NOTE — Progress Notes (Signed)
ANTICOAGULATION CONSULT NOTE - Initial Consult  Pharmacy Consult for Lovenox Indication: DVT  No Known Allergies  Patient Measurements: Height: 5' 1.4" (156 cm) Weight: 151 lb 3.8 oz (68.6 kg) IBW/kg (Calculated) : 48.72 Heparin Dosing Weight:   Vital Signs: Temp: 98.7 F (37.1 C) (03/03 1755) Temp src: Oral (03/03 1755) BP: 179/63 mmHg (03/03 1755) Pulse Rate: 66 (03/03 1755)  Labs:  Recent Labs  01/06/14 1958  HGB 11.3*  HCT 34.3*  PLT 182  CREATININE 1.44*    Estimated Creatinine Clearance: 26.5 ml/min (by C-G formula based on Cr of 1.44).   Medical History: Past Medical History  Diagnosis Date  . Atrophy of vulva   . Dysuria   . Hyperpotassemia   . Unspecified hereditary and idiopathic peripheral neuropathy   . Osteoporosis, unspecified   . Hypertension   . Vitamin deficiency   . Unspecified malignant neoplasm of skin, site unspecified   . Chronic kidney disease   . Dermatophytosis of nail   . Other diseases of larynx   . Other B-complex deficiencies   . Mild cognitive impairment, so stated   . Other voice and resonance disorders   . Depression   . Hyperlipidemia   . Carotid stenosis   . Unspecified urinary incontinence   . Nephrolithiasis   . Macular degeneration     "left eye; growth behind that" (01/06/2014)  . GERD (gastroesophageal reflux disease)   . Reflux esophagitis   . DVT (deep venous thrombosis) 01/06/2014    RLE  . Seizures     "one, years ago" (01/06/2014)    Medications:  Prescriptions prior to admission  Medication Sig Dispense Refill  . amitriptyline (ELAVIL) 50 MG tablet Take 1 tablet (50 mg total) by mouth at bedtime. Take one tablet at bedtime  90 tablet  1  . amLODipine (NORVASC) 10 MG tablet Take 10 mg by mouth daily.      . calcium carbonate (OS-CAL) 600 MG TABS Take 600 mg by mouth 2 (two) times daily with a meal. Take one tablet once in the morning and one in the evening      . dipyridamole-aspirin (AGGRENOX) 200-25 MG per  12 hr capsule Take 1 capsule by mouth 2 (two) times daily.      . fenofibrate 54 MG tablet Take one tablet once a day for cholesterol  90 tablet  1  . gabapentin (NEURONTIN) 100 MG capsule Take 1 capsule (100 mg total) by mouth 2 (two) times daily.  180 capsule  1  . metoprolol succinate (TOPROL-XL) 25 MG 24 hr tablet Take one tablet once a day for blood pressure  90 tablet  1  . omeprazole (PRILOSEC) 20 MG capsule Take 20 mg by mouth 2 (two) times daily before a meal.       . pentazocine-naloxone (TALWIN NX) 50-0.5 MG per tablet Take 1 tablet by mouth every 4 (four) hours as needed.      . promethazine (PHENERGAN) 25 MG tablet       . simvastatin (ZOCOR) 10 MG tablet Take one tablet once a day for cholesterol  90 tablet  1    Assessment: 85yof to start Lovenox for acute RLE DVT. Patient reports no current bleeding and not taking any anticoagulants. - Weight: 69kg - CrCl: 26 ml/min - H/H low, Plts wnl  Goal of Therapy:  Anti-Xa level 0.6-1 units/ml 4hrs after LMWH dose given Monitor platelets by anticoagulation protocol: Yes   Plan:  1. Lovenox 70mg  (~1 mg/kg) SQ daily -  renally adjusted for Crcl <30 2. Monitor renal function, CBC q72h, and oral anticoagulation plans  Earleen Newport [phone] 01/06/2014,8:49 PM

## 2014-01-07 DIAGNOSIS — I10 Essential (primary) hypertension: Secondary | ICD-10-CM | POA: Diagnosis not present

## 2014-01-07 DIAGNOSIS — E785 Hyperlipidemia, unspecified: Secondary | ICD-10-CM

## 2014-01-07 DIAGNOSIS — N183 Chronic kidney disease, stage 3 unspecified: Secondary | ICD-10-CM

## 2014-01-07 DIAGNOSIS — I82409 Acute embolism and thrombosis of unspecified deep veins of unspecified lower extremity: Secondary | ICD-10-CM | POA: Diagnosis not present

## 2014-01-07 LAB — LUPUS ANTICOAGULANT PANEL
DRVVT: 36.6 s (ref <42.9)
Lupus Anticoagulant: NOT DETECTED
PTT Lupus Anticoagulant: 33.3 secs (ref 28.0–43.0)

## 2014-01-07 LAB — PROTEIN C ACTIVITY: PROTEIN C ACTIVITY: 140 % — AB (ref 75–133)

## 2014-01-07 LAB — BETA-2-GLYCOPROTEIN I ABS, IGG/M/A
BETA 2 GLYCO I IGG: 0 G Units (ref <20)
Beta-2-Glycoprotein I IgA: 7 A Units (ref <20)
Beta-2-Glycoprotein I IgM: 14 M Units (ref <20)

## 2014-01-07 LAB — HOMOCYSTEINE: HOMOCYSTEINE-NORM: 16.4 umol/L — AB (ref 4.0–15.4)

## 2014-01-07 LAB — CARDIOLIPIN ANTIBODIES, IGG, IGM, IGA
ANTICARDIOLIPIN IGA: 4 U/mL — AB (ref <22)
ANTICARDIOLIPIN IGG: 5 GPL U/mL — AB (ref <23)
ANTICARDIOLIPIN IGM: 5 [MPL'U]/mL — AB (ref <11)

## 2014-01-07 LAB — PROTEIN S ACTIVITY: Protein S Activity: 110 % (ref 69–129)

## 2014-01-07 LAB — PROTEIN S, TOTAL: PROTEIN S AG TOTAL: 99 % (ref 60–150)

## 2014-01-07 LAB — PROTEIN C, TOTAL: Protein C, Total: 99 % (ref 72–160)

## 2014-01-07 MED ORDER — WARFARIN - PHARMACIST DOSING INPATIENT: Freq: Every day | Status: DC

## 2014-01-07 MED ORDER — WARFARIN SODIUM 5 MG PO TABS
5.0000 mg | ORAL_TABLET | Freq: Once | ORAL | Status: AC
  Administered 2014-01-07: 5 mg via ORAL
  Filled 2014-01-07: qty 1

## 2014-01-07 NOTE — Evaluation (Signed)
Physical Therapy Evaluation Patient Details Name: Brittany Hughes MRN: [id number] DOB: 07-10-1928 Today's Date: 01/07/2014 Time: 5465-0354 PT Time Calculation (min): 30 min  PT Assessment / Plan / Recommendation History of Present Illness  Brittany Hughes is a 80 y.o. female  Directly admitted after doppler showed right leg DVT posterior tibial.  Brittany Circle several months ago and has had right leg pain since then. No edema. No CP, SHOB or palpitations. No immobility, long trips, surgery.  Has fallen multiple times recently. Fractured right wrist several days ago, splinted and sling.  Lives alone. Family has been talking about placement. Also h/o mild dementia and gets confused occasionally  Clinical Impression  Patient presents with decreased independence and safety limiting mobility.  Will benefit from skilled PT in the acute setting to allow return home following STSNF stay.    PT Assessment  Patient needs continued PT services    Follow Up Recommendations  SNF;Supervision/Assistance - 24 hour    Does the patient have the potential to tolerate intense rehabilitation    N/A  Barriers to Discharge Decreased caregiver support      Equipment Recommendations  Other (comment) (TBA)    Recommendations for Other Services   None  Frequency Min 3X/week    Precautions / Restrictions Precautions Precautions: Fall Precaution Comments: fell at home twice recently; once standing to take off pants, once negotiating steps to put out trash at apt complex   Pertinent Vitals/Pain C/o wrist pain 7/10 (called ortho tech for splint padding and larger sling)      Mobility  Bed Mobility Overal bed mobility: Modified Independent General bed mobility comments: HOB elevated and pulls up with rail Transfers Overall transfer level: Needs assistance Equipment used: 1 person hand held assist Transfers: Sit to/from Stand Sit to Stand: Min assist General transfer comment: initially posterior loss of balance  sitting on bed from standing Ambulation/Gait Ambulation/Gait assistance: Min assist Ambulation Distance (Feet): 20 Feet Assistive device: 1 person hand held assist Gait Pattern/deviations: Step-through pattern;Shuffle;Decreased stride length General Gait Details: short shuffling steps even with hand held assist, little antalgic on right LE    Exercises     PT Diagnosis: Abnormality of gait;Generalized weakness  PT Problem List: Decreased strength;Decreased balance;Decreased mobility;Decreased safety awareness;Pain PT Treatment Interventions: DME instruction;Balance training;Gait training;Functional mobility training;Patient/family education;Therapeutic activities;Therapeutic exercise     PT Goals(Current goals can be found in the care plan section) Acute Rehab PT Goals Patient Stated Goal: To go to rehab  PT Goal Formulation: With patient Time For Goal Achievement: 01/21/14 Potential to Achieve Goals: Good  Visit Information  Last PT Received On: 01/07/14 Assistance Needed: +1 History of Present Illness: Brittany Hughes is a 80 y.o. female  Directly admitted after doppler showed right leg DVT posterior tibial.  Brittany Circle several months ago and has had right leg pain since then. No edema. No CP, SHOB or palpitations. No immobility, long trips, surgery.  Has fallen multiple times recently. Fractured right wrist several days ago, splinted and sling.  Lives alone. Family has been talking about placement. Also h/o mild dementia and gets confused occasionally       Prior [place] expects to be discharged to:: Skilled nursing facility Living Arrangements: Alone Type of Home: Apartment Home Access: Level entry Home Layout: One level Home Equipment: None Prior Function Level of Independence: Independent Comments: admits to self limiting driving to local due to getting confused once. Communication Communication: No difficulties Dominant Hand: Right  Cognition  Cognition Arousal/Alertness: Awake/alert Behavior During Therapy: WFL for tasks assessed/performed Overall Cognitive Status: Within Functional Limits for tasks assessed Memory: Decreased short-term memory    Extremity/Trunk Assessment Upper Extremity Assessment Upper Extremity Assessment: Defer to OT evaluation Lower Extremity Assessment Lower Extremity Assessment: Overall WFL for tasks assessed   Balance Balance Overall balance assessment: Needs assistance Standing balance-Leahy Scale: Poor Standing balance comment: fell posteriorly onto bed when first standing up   End of Session PT - End of Session Equipment Utilized During Treatment: Gait belt Activity Tolerance: Patient tolerated treatment well Patient left: in chair;with call bell/phone within reach;with chair alarm set  GP     [facility] 01/07/2014, 1:03 PM Magda Kiel, [place] 01/07/2014

## 2014-01-07 NOTE — Evaluation (Signed)
Occupational Therapy Evaluation Patient Details Name: Brittany Hughes MRN: [id number] DOB: [birth year] Today's Date: 01/07/2014 Time: 5188-4166 OT Time Calculation (min): 40 min  OT Assessment / Plan / Recommendation History of present illness Brittany Hughes is a 80 y.o. female  Directly admitted after doppler showed right leg DVT posterior tibial.  [place] several months ago and has had right leg pain since then. No edema. No CP, SHOB or palpitations. No immobility, long trips, surgery.  Has fallen multiple times recently. Fractured right wrist several days ago, splinted and sling.  Lives alone. Family has been talking about placement. Also h/o mild dementia and gets confused occasionally   Clinical Impression   Pt demos decline in function with ADLs and ADL mobility safety with decreased R UE ROM/function, strength, balance and endurance. Pt would benefit from acute OT services to address impairments to increase level of function and safety. Physician ion to talk with pt/family during session    OT Assessment  Patient needs continued OT Services    Follow Up Recommendations  SNF;Supervision/Assistance - 24 hour    Barriers to Discharge Decreased caregiver support Pt lives at home alone  Equipment Recommendations  None recommended by OT;Other (comment) (TBD at next venue of care)    Recommendations for Other Services    Frequency  Min 2X/week    Precautions / Restrictions Precautions Precautions: Fall Precaution Comments: fell at home twice recently; once standing to take off pants, once negotiating steps to put out trash at apt complex Restrictions Weight Bearing Restrictions: Yes RUE Weight Bearing: Non weight bearing Other Position/Activity Restrictions: Wrist fx, forearm in splint and sling   Pertinent Vitals/Pain 5/10 R UE    ADL  Grooming: Performed;Wash/dry hands;Wash/dry face;Min guard Where Assessed - Grooming: Supported standing Upper Body Bathing: Simulated;Minimal  assistance Lower Body Bathing: Simulated;Moderate assistance;Maximal assistance Upper Body Dressing: Performed;Minimal assistance Lower Body Dressing: Maximal assistance;Performed Toilet Transfer: Performed;Minimal assistance Toilet Transfer Method: Sit to stand Toilet Transfer Equipment: Regular height toilet;Grab bars Toileting - Clothing Manipulation and Hygiene: Moderate assistance Where Assessed - Toileting Clothing Manipulation and Hygiene: Standing Tub/Shower Transfer Method: Not assessed Transfers/Ambulation Related to ADLs: LOB sit - stand from bed and toilet ADL Comments: needs assist with ADLs due to R UE immobilized, decreased standing balance. Pt and family educated on sling wear and dressing techniques    OT Diagnosis: Generalized weakness;Acute pain  OT Problem List: Decreased strength;Impaired UE functional use;Decreased knowledge of precautions;Impaired balance (sitting and/or standing);Decreased knowledge of use of DME or AE;Decreased safety awareness;Decreased activity tolerance OT Treatment Interventions: Self-care/ADL training;Therapeutic activities;Therapeutic exercise;Neuromuscular education;Patient/family education;DME and/or AE instruction;Balance training   OT Goals(Current goals can be found in the care plan section) Acute Rehab OT Goals Patient Stated Goal: get well at rehab and go home OT Goal Formulation: With patient/family Time For Goal Achievement: 01/14/14 Potential to Achieve Goals: Good ADL Goals Pt Will Perform Grooming: with min guard assist;with supervision;with set-up;standing Pt Will Perform Upper Body Bathing: with min assist;sitting Pt Will Perform Lower Body Bathing: with mod assist;with min assist;sitting/lateral leans;sit to/from stand Pt Will Perform Upper Body Dressing: with min assist;sitting Pt Will Perform Lower Body Dressing: with mod assist;with min assist;sitting/lateral leans;sit to/from stand Pt Will Transfer to Toilet: with min  guard assist;with supervision;regular height toilet;ambulating;grab bars Pt Will Perform Toileting - Clothing Manipulation and hygiene: with min assist;with min guard assist;sit to/from stand;sitting/lateral leans Additional ADL Goal #1: Pt will perform AROM R shoulder 3 sets 10 reps seated EOB  Visit Information  Last OT Received On: 01/07/14 Assistance Needed: +1 History of Present Illness: Brittany Hughes is a 81 y.o. female  Directly admitted after doppler showed right leg DVT posterior tibial.  Fell several months ago and has had right leg pain since then. No edema. No CP, SHOB or palpitations. No immobility, long trips, surgery.  Has fallen multiple times recently. Fractured right wrist several days ago, splinted and sling.  Lives alone. Family has been talking about placement. Also h/o mild dementia and gets confused occasionally       Prior [place] expects to be discharged to:: Skilled nursing facility Living Arrangements: Alone Type of Home: Apartment Home Access: Level entry Home Layout: One level Home Equipment: None Prior Function Level of Independence: Independent Comments: admits to self limiting driving to local due to getting confused once. Communication Communication: No difficulties Dominant Hand: Right         Vision/Perception Vision - History Baseline Vision: Wears glasses all the time Patient Visual Report: No change from baseline Perception Perception: Within Functional Limits   Cognition  Cognition Arousal/Alertness: Awake/alert Behavior During Therapy: WFL for tasks assessed/performed Overall Cognitive Status: Within Functional Limits for tasks assessed Memory: Decreased short-term memory    Extremity/Trunk Assessment Upper Extremity Assessment Upper Extremity Assessment: Overall WFL for tasks assessed;Generalized weakness;RUE deficits/detail RUE Deficits / Details: splint/sling forearm, R shoulder WNL RUE:  Unable to fully assess due to immobilization Lower Extremity Assessment Lower Extremity Assessment: Defer to PT evaluation     Mobility Bed Mobility Overal bed mobility: Modified Independent General bed mobility comments: HOB elevated and pulls up with rail Transfers Overall transfer level: Needs assistance Equipment used: 1 person hand held assist Transfers: Sit to/from Stand Sit to Stand: Min assist General transfer comment: LOB sit - stand from bed and toilet     Exercise Other Exercises Other Exercises: AROM R shoulder Other Exercises: sling education, NWB R UE   Balance Balance Overall balance assessment: Needs assistance Sitting-balance support: Single extremity supported;Feet supported Sitting balance-Leahy Scale: Good Standing balance support: Single extremity supported;During functional activity Standing balance-Leahy Scale: Poor Standing balance comment: LOB sit - stand from bed and toilet   End of Session OT - End of Session Equipment Utilized During Treatment: Other (comment) (R UE sling) Activity Tolerance: Patient tolerated treatment well Patient left: in bed;with call bell/phone within reach;with bed alarm set;with nursing/sitter in room  GO     Britt Bottom 01/07/2014, 2:55 PM

## 2014-01-07 NOTE — Progress Notes (Signed)
Orthopedic Tech Progress Note Patient Details:  [name] [birth year] [id number]  Ortho Devices Type of Ortho Device: Arm sling Ortho Device/Splint Location: Replacement arm sling Ortho Device/Splint Interventions: Application   Cammer, Theodoro Parma 01/07/2014, 1:32 PM

## 2014-01-07 NOTE — Progress Notes (Signed)
[facility] PROGRESS NOTE  Brittany Hughes ZOX:[id number] DOB: 24-Nov-1927 DOA: 01/06/2014 PCP: Oneal Grout, MD  Assessment/Plan: Acute right lower extremity DVT -Appears unprovoked -Given the patient's 2 recent falls, I have discussed the risks, benefits, and alternatives of starting the patient on anticoagulation versus performing an IVC filter -The family has agreed upon starting the patient on warfarin with Lovenox bridge -Given the patient's falls and renal insufficiency, hesitate to start on Factor Xa antagonist Hypertension  -Continue amlodipine and metoprolol succinate  Peripheral neuropathy  -Continue gabapentin  Hyperlipidemia  -continue statin  Deconditioning  -PT recommend skilled nursing facility  -Patient and family agree  Right Wrist fracture -continue sling ans splint   Family Communication:   sister at beside--total time 35 min Disposition Plan:   Home when medically stable      Procedures/Studies: Dg Wrist Complete Right  01/06/2014   CLINICAL DATA:  Right wrist pain after fall.  EXAM: RIGHT WRIST - COMPLETE 3+ VIEW  COMPARISON:  None.  FINDINGS: Minimally displaced fracture is seen involving the distal radial metaphysis. Mildly displaced ulnar styloid fracture is noted as well.  IMPRESSION: Minimally displaced distal radial fracture. Mildly displaced ulnar styloid fracture.   Electronically Signed   By: Roque Lias M.D.   On: 01/06/2014 14:55         Subjective:  patient denies any fevers, chills, chest discomfort, shortness breath, nausea, vomiting, diarrhea, abdominal pain.  Objective: Filed Vitals:   01/06/14 2140 01/07/14 0601 01/07/14 1001 01/07/14 1339  BP: 136/60 148/52 126/63 155/75  Pulse: 68 63 60 69  Temp: 98.7 F (37.1 C) 98.6 F (37 C) 98.4 F (36.9 C) 97.8 F (36.6 C)  TempSrc: Oral Oral Oral Oral  Resp: 18 18 18 18   Height:      Weight: 68.6 kg (151 lb 3.8 oz)     SpO2: 98% 96% 98% 97%    Intake/Output Summary (Last  24 hours) at 01/07/14 1545 Last data filed at 01/07/14 0900  Gross per 24 hour  Intake    180 ml  Output      0 ml  Net    180 ml   Weight change:  Exam:   General:  Pt is alert, follows commands appropriately, not in acute distress  HEENT: No icterus, No thrush, [place]/AT  Cardiovascular: RRR, S1/S2, no rubs, no gallops  Respiratory: CTA bilaterally, no wheezing, no crackles, no rhonchi  Abdomen: Soft/+BS, non tender, non distended, no guarding  Extremities: No edema, No lymphangitis, No petechiae, No rashes, no synovitis  Data Reviewed: Basic Metabolic Panel:  Recent Labs Lab 01/06/14 1958  NA 141  K 4.3  CL 104  CO2 21  GLUCOSE 144*  BUN 20  CREATININE 1.44*  CALCIUM 9.2   Liver Function Tests: No results found for this basename: AST, ALT, ALKPHOS, BILITOT, PROT, ALBUMIN,  in the last 168 hours No results found for this basename: LIPASE, AMYLASE,  in the last 168 hours No results found for this basename: AMMONIA,  in the last 168 hours CBC:  Recent Labs Lab 01/06/14 1958  WBC 7.9  HGB 11.3*  HCT 34.3*  MCV 92.5  PLT 182   Cardiac Enzymes: No results found for this basename: CKTOTAL, CKMB, CKMBINDEX, TROPONINI,  in the last 168 hours BNP: No components found with this basename: POCBNP,  CBG: No results found for this basename: GLUCAP,  in the last 168 hours  No results found for this or any previous visit (from the past 240  hour(s)).   Scheduled Meds: . amitriptyline  50 mg Oral QHS  . amLODipine  10 mg Oral Daily  . calcium carbonate  1 tablet Oral Q breakfast  . dipyridamole-aspirin  1 capsule Oral Daily  . docusate sodium  100 mg Oral BID  . enoxaparin (LOVENOX) injection  70 mg Subcutaneous Q24H  . fenofibrate  54 mg Oral Daily  . gabapentin  100 mg Oral BID  . metoprolol succinate  25 mg Oral Daily  . pantoprazole  40 mg Oral Daily  . simvastatin  10 mg Oral q1800   Continuous Infusions:    Tamikka Pilger, DO  [facility] Pager  [phone]  If 7PM-7AM, please contact night-coverage [email or web address] Password [email or web address] 01/07/2014, 3:45 PM   LOS: 1 day

## 2014-01-07 NOTE — Progress Notes (Signed)
Patient was advised to have her sister bring a copy of her home medications for pharmacy to review. Laronda Lisby Joselita,RN

## 2014-01-07 NOTE — Progress Notes (Signed)
ANTICOAGULATION CONSULT NOTE - Initial Consult  Pharmacy Consult for Coumadin Indication: acute RLE DVT  No Known Allergies  Patient Measurements: Height: 5' 1.4" (156 cm) Weight: 151 lb 3.8 oz (68.6 kg) IBW/kg (Calculated) : 48.72  Vital Signs: Temp: 97.8 F (36.6 C) (03/04 1339) Temp src: Oral (03/04 1339) BP: 155/75 mmHg (03/04 1339) Pulse Rate: 69 (03/04 1339)  Labs:  Recent Labs  01/06/14 1958  HGB 11.3*  HCT 34.3*  PLT 182  APTT 28  LABPROT 13.4  INR 1.04  CREATININE 1.44*    Estimated Creatinine Clearance: 26.5 ml/min (by C-G formula based on Cr of 1.44).   Medical History: Past Medical History  Diagnosis Date  . Atrophy of vulva   . Dysuria   . Hyperpotassemia   . Unspecified hereditary and idiopathic peripheral neuropathy   . Osteoporosis, unspecified   . Hypertension   . Vitamin deficiency   . Unspecified malignant neoplasm of skin, site unspecified   . Chronic kidney disease   . Dermatophytosis of nail   . Other diseases of larynx   . Other B-complex deficiencies   . Mild cognitive impairment, so stated   . Other voice and resonance disorders   . Depression   . Hyperlipidemia   . Carotid stenosis   . Unspecified urinary incontinence   . Nephrolithiasis   . Macular degeneration     "left eye; growth behind that" (01/06/2014)  . GERD (gastroesophageal reflux disease)   . Reflux esophagitis   . DVT (deep venous thrombosis) 01/06/2014    RLE  . Seizures     "one, years ago" (01/06/2014)    Medications:  Prescriptions prior to admission  Medication Sig Dispense Refill  . ergocalciferol (VITAMIN D2) 50000 UNITS capsule Take 50,000 Units by mouth once a week.      . flavoxATE (URISPAS) 100 MG tablet Take 100 mg by mouth 3 (three) times daily as needed for bladder spasms.      Marland Kitchen amitriptyline (ELAVIL) 50 MG tablet Take 50 mg by mouth at bedtime. Take one tablet at bedtime      . amLODipine (NORVASC) 10 MG tablet Take 10 mg by mouth daily.      .  calcium carbonate (OS-CAL) 600 MG TABS Take 600 mg by mouth 2 (two) times daily with a meal. Take one tablet once in the morning and one in the evening      . dicyclomine (BENTYL) 10 MG capsule Take 10 mg by mouth 4 (four) times daily -  before meals and at bedtime.      . dipyridamole-aspirin (AGGRENOX) 200-25 MG per 12 hr capsule Take 1 capsule by mouth 2 (two) times daily.      . fenofibrate 54 MG tablet Take 54 mg by mouth daily. Take one tablet once a day for cholesterol      . gabapentin (NEURONTIN) 100 MG capsule Take 100 mg by mouth daily as needed.      . metoprolol succinate (TOPROL-XL) 25 MG 24 hr tablet Take 25 mg by mouth daily. Take one tablet once a day for blood pressure      . omeprazole (PRILOSEC) 20 MG capsule Take 20 mg by mouth 2 (two) times daily before a meal.       . pentazocine-naloxone (TALWIN NX) 50-0.5 MG per tablet Take 1 tablet by mouth daily as needed for moderate pain.       . promethazine (PHENERGAN) 25 MG tablet Take 25 mg by mouth daily as needed.       Marland Kitchen  simvastatin (ZOCOR) 10 MG tablet Take 10 mg by mouth daily at 6 PM. Take one tablet once a day for cholesterol      . [DISCONTINUED] amitriptyline (ELAVIL) 50 MG tablet Take 1 tablet (50 mg total) by mouth at bedtime. Take one tablet at bedtime  90 tablet  1  . [DISCONTINUED] fenofibrate 54 MG tablet Take one tablet once a day for cholesterol  90 tablet  1  . [DISCONTINUED] gabapentin (NEURONTIN) 100 MG capsule Take 1 capsule (100 mg total) by mouth 2 (two) times daily.  180 capsule  1  . [DISCONTINUED] metoprolol succinate (TOPROL-XL) 25 MG 24 hr tablet Take one tablet once a day for blood pressure  90 tablet  1  . [DISCONTINUED] simvastatin (ZOCOR) 10 MG tablet Take one tablet once a day for cholesterol  90 tablet  1    Assessment: Dr. Carles Collet notes that the family has agreed upon starting the patient on warfarin with Lovenox bridge for acute RLE DVT. -Given the patient's falls and renal insufficiency, Dr. Carles Collet is  hesitate to start on Factor Xa antagonist.  The patient is 80 y.o female currently on lovenox 70 mg SQ q24h for acute RLE DVT, dose adjusted for decreased renal function. No bleeding reported.   - Weight: 69kg - CrCl: 26 ml/min - H/H low, Plts wnl - Baseline INR = 1.04  Goal of Therapy:  Anti-Xa level 0.6-1 units/ml 4hrs after LMWH dose given Monitor platelets by anticoagulation protocol: Yes   Plan:  -Give coumadin 5 mg po today -Continue Lovenox 70mg  (~1 mg/kg) SQ daily - renally adjusted for Crcl <30. - Monitor Protime/INR daily, renal function, and CBC q72h.   Nicole Cella, RPh Clinical Pharmacist Pager: [phone] 01/07/2014,4:21 PM

## 2014-01-08 DIAGNOSIS — I82409 Acute embolism and thrombosis of unspecified deep veins of unspecified lower extremity: Secondary | ICD-10-CM | POA: Diagnosis not present

## 2014-01-08 DIAGNOSIS — I10 Essential (primary) hypertension: Secondary | ICD-10-CM | POA: Diagnosis not present

## 2014-01-08 LAB — CBC
HCT: 31.2 % — ABNORMAL LOW (ref 36.0–46.0)
HEMOGLOBIN: 10.3 g/dL — AB (ref 12.0–15.0)
MCH: 30.5 pg (ref 26.0–34.0)
MCHC: 33 g/dL (ref 30.0–36.0)
MCV: 92.3 fL (ref 78.0–100.0)
PLATELETS: 177 10*3/uL (ref 150–400)
RBC: 3.38 MIL/uL — ABNORMAL LOW (ref 3.87–5.11)
RDW: 14.1 % (ref 11.5–15.5)
WBC: 6 10*3/uL (ref 4.0–10.5)

## 2014-01-08 LAB — PROTIME-INR
INR: 0.92 (ref 0.00–1.49)
PROTHROMBIN TIME: 12.2 s (ref 11.6–15.2)

## 2014-01-08 MED ORDER — WARFARIN VIDEO
Freq: Once | Status: AC
  Administered 2014-01-08: 17:00:00

## 2014-01-08 MED ORDER — LORAZEPAM 1 MG PO TABS
1.0000 mg | ORAL_TABLET | Freq: Once | ORAL | Status: AC
  Administered 2014-01-08: 1 mg via ORAL
  Filled 2014-01-08: qty 1

## 2014-01-08 MED ORDER — WARFARIN SODIUM 5 MG PO TABS
5.0000 mg | ORAL_TABLET | Freq: Once | ORAL | Status: AC
  Administered 2014-01-08: 5 mg via ORAL
  Filled 2014-01-08: qty 1

## 2014-01-08 MED ORDER — COUMADIN BOOK
Freq: Once | Status: AC
  Administered 2014-01-08: 17:00:00
  Filled 2014-01-08: qty 1

## 2014-01-08 MED ORDER — ZOLPIDEM TARTRATE 5 MG PO TABS
5.0000 mg | ORAL_TABLET | Freq: Once | ORAL | Status: AC
  Administered 2014-01-08: 5 mg via ORAL
  Filled 2014-01-08: qty 1

## 2014-01-08 NOTE — Clinical Social Work Placement (Addendum)
Clinical Social Work Department CLINICAL SOCIAL WORK PLACEMENT NOTE 01/08/2014  Patient:  Brittany Hughes, Brittany Hughes  Account Number:  [id number] Admit date:  01/06/2014  Clinical Social Worker:  Crawford Givens, LCSW  Date/time:  01/08/2014 06:25 AM  Clinical Social Work is seeking post-discharge placement for this patient at the following level of care:   SKILLED NURSING   (*CSW will update this form in Epic as items are completed)   01/08/2014  Patient/family provided with [place] Department of Clinical Social Work's list of facilities offering this level of care within the geographic area requested by the patient (or if unable, by the patient's family).  01/08/2014  Patient/family informed of their freedom to choose among providers that offer the needed level of care, that participate in Medicare, Medicaid or managed care program needed by the patient, have an available bed and are willing to accept the patient.    Patient/family informed of [facility]' ownership interest in [facility], as well as of the fact that they are under no obligation to receive care at this facility.  PASARR submitted to EDS on 01/08/2014 PASARR number received from EDS on 01/08/2014  FL2 transmitted to all facilities in geographic area requested by pt/family on  01/08/2014 FL2 transmitted to all facilities within larger geographic area on   Patient informed that his/her managed care company has contracts with or will negotiate with  certain facilities, including the following:     Patient/family informed of bed offers received: 01/08/14  Patient chooses bed at [facility] Physician recommends and patient chooses bed at    Patient to be transferred to [facility] on  01/09/2014 Patient to be transferred to facility by family vehicle  The following physician request were entered in Epic:   Additional Comments: CSW provided discharge packet to pt's daughter, and called  [facility] to inform them pt discharging this morning. Daughter states she will drive pt to facility. CSW signing off.   Ky Barban, MSW, [facility] Clinical Social Worker [phone]

## 2014-01-08 NOTE — Clinical Social Work Psychosocial (Addendum)
Clinical Social Work Department BRIEF PSYCHOSOCIAL ASSESSMENT 01/08/2014  Patient:  Brittany Hughes, Brittany Hughes     Account Number:  [id number]     Admit date:  01/06/2014  Clinical Social Worker:  Frederico Hamman  Date/Time:  01/08/2014 06:14 AM  Referred by:  Physician  Date Referred:  01/08/2014 Referred for  SNF Placement   Other Referral:   Interview type:  Patient Other interview type:   CSW also talked with patient's sister Brittany Hughes who was at the bedside.    PSYCHOSOCIAL DATA Living Status:  ALONE Admitted from facility:   Level of care:   Primary support name:  Brittany Hughes Primary support relationship to patient:  SIBLING Degree of support available:   Sister Earlie Server very supportive and attentive to patient's needs. Per sister, she and patient are the only two siblings left    CURRENT CONCERNS Current Concerns  Post-Acute Placement   Other Concerns:    SOCIAL WORK ASSESSMENT / PLAN CSW talked with patient and sister Brittany Hughes about discharge plans and PT recommendation of short-term rehab. Patient was alert and able to understand and converse with CSW, and she was fine with sister's involvement in the conversation. Both expressed understanding and agreement with the need for rehab and sister indicated that their facility preferences are [facility] and second choice [facility], as this facility is across the street from patient's apartment complex.   Patient's sister also verbalized understanding that patient has to have a 3-day qualifying stay before going to a skilled facility and that patient won't be able to discharge until Friday, as she was admitted 3/3. CSW voice understanding and agreement with Medicare criteria.    Assessment/plan status:  Psychosocial Support/Ongoing Assessment of Needs Other assessment/ plan:   Information/referral to community resources:   SNF list for [facility]    PATIENT'S/FAMILY'S RESPONSE TO PLAN OF  CARE: Patient and her sister were very pleasnat and open to speaking with CSW about discharge planning. Sister very hopeful that patient can go to [facility]. She asked to be notified regarding facility responses.

## 2014-01-08 NOTE — Progress Notes (Signed)
ANTICOAGULATION CONSULT NOTE - Follow Up Consult  Pharmacy Consult for Lovenox --> Coumadin Indication: DVT  No Known Allergies  Patient Measurements: Height: 5' 1.4" (156 cm) Weight: 151 lb 3.8 oz (68.6 kg) IBW/kg (Calculated) : 48.72  Vital Signs: Temp: 98.4 F (36.9 C) (03/05 1317) Temp src: Oral (03/05 1317) BP: 110/85 mmHg (03/05 1317) Pulse Rate: 75 (03/05 1317)  Labs:  Recent Labs  01/06/14 1958 01/08/14 0550 01/08/14 0920  HGB 11.3* 10.3*  --   HCT 34.3* 31.2*  --   PLT 182 177  --   APTT 28  --   --   LABPROT 13.4  --  12.2  INR 1.04  --  0.92  CREATININE 1.44*  --   --     Estimated Creatinine Clearance: 26.5 ml/min (by C-G formula based on Cr of 1.44).   Medications:  Scheduled:  . amitriptyline  50 mg Oral QHS  . amLODipine  10 mg Oral Daily  . calcium carbonate  1 tablet Oral Q breakfast  . dipyridamole-aspirin  1 capsule Oral Daily  . docusate sodium  100 mg Oral BID  . enoxaparin (LOVENOX) injection  70 mg Subcutaneous Q24H  . fenofibrate  54 mg Oral Daily  . gabapentin  100 mg Oral BID  . metoprolol succinate  25 mg Oral Daily  . pantoprazole  40 mg Oral Daily  . simvastatin  10 mg Oral q1800  . Warfarin - Pharmacist Dosing Inpatient   Does not apply q1800    Assessment: 80 yo F with new RLE DVT started on Lovenox and Coumadin for treatment.  Today is day #2 of 5 day minimum overlap.  INR remains low, as expected, after first dose of Coumadin.  No bleeding noted.  Goal of Therapy:  INR 2-3 Monitor platelets by anticoagulation protocol: Yes   Plan:  Continue Lovenox 70 mg SQ daily. Coumadin 5 mg PO x 1 tonight. Continue daily INR and CBC q72 hours. Coumadin education. Patient can be discharged on Lovenox to Coumadin bridging once medically ready otherwise.  [employer], Pharm.D., BCPS Clinical Pharmacist Pager [phone] 01/08/2014 2:14 PM

## 2014-01-08 NOTE — Progress Notes (Signed)
[facility] PROGRESS NOTE  MIRLANDE DESCHAINE ZOX:[id number] DOB: 20-Apr-1928 DOA: 01/06/2014 PCP: Oneal Grout, MD  Assessment/Plan: Acute right lower extremity DVT  -Appears unprovoked  -Given the patient's 2 recent falls, I have discussed the risks, benefits, and alternatives of starting the patient on anticoagulation versus performing an IVC filter including but not limited to recurrent fall with resultant life threatening bleeding due to trauma -The family has expressed understanding and agreed upon starting the patient on warfarin with Lovenox bridge  -Given the patient's falls and renal insufficiency, hesitate to start on Factor Xa antagonist  -Previously ordered lupus anticoagulant, antithrombin III, protein C and protein S are unremarkable Hypertension  -Continue amlodipine and metoprolol succinate  Peripheral neuropathy  -Continue gabapentin  Hyperlipidemia  -continue statin  Deconditioning  -PT recommend skilled nursing facility  -Patient and family agree  Right Wrist fracture  -continue sling ans splint  Family Communication: sister updated 01/07/14 Disposition Plan: SNF on 01/09/14          Procedures/Studies: Dg Wrist Complete Right  01/06/2014   CLINICAL DATA:  Right wrist pain after fall.  EXAM: RIGHT WRIST - COMPLETE 3+ VIEW  COMPARISON:  None.  FINDINGS: Minimally displaced fracture is seen involving the distal radial metaphysis. Mildly displaced ulnar styloid fracture is noted as well.  IMPRESSION: Minimally displaced distal radial fracture. Mildly displaced ulnar styloid fracture.   Electronically Signed   By: Roque Lias M.D.   On: 01/06/2014 14:55         Subjective: Patient denies fevers, chills, chest contours breath, nausea, vomiting, diarrhea. No abdominal pain.  Objective: Filed Vitals:   01/07/14 2100 01/08/14 0607 01/08/14 1006 01/08/14 1317  BP: 152/87 120/52 126/50 110/85  Pulse: 69 57 66 75  Temp: 98 F (36.7 C) 98.2 F (36.8 C) 98.5  F (36.9 C) 98.4 F (36.9 C)  TempSrc: Oral Oral Oral Oral  Resp: 20 18 18 18   Height:      Weight:      SpO2: 97% 95% 97% 98%    Intake/Output Summary (Last 24 hours) at 01/08/14 1831 Last data filed at 01/08/14 1317  Gross per 24 hour  Intake    600 ml  Output      0 ml  Net    600 ml   Weight change:  Exam:   General:  Pt is alert, follows commands appropriately, not in acute distress  HEENT: No icterus, No thrush, No neck mass, Reform/AT  Cardiovascular: RRR, S1/S2, no rubs, no gallops  Respiratory: CTA bilaterally, no wheezing, no crackles, no rhonchi  Abdomen: Soft/+BS, non tender, non distended, no guarding  Extremities: No edema, No lymphangitis, No petechiae, No rashes, no synovitis  Data Reviewed: Basic Metabolic Panel:  Recent Labs Lab 01/06/14 1958  NA 141  K 4.3  CL 104  CO2 21  GLUCOSE 144*  BUN 20  CREATININE 1.44*  CALCIUM 9.2   Liver Function Tests: No results found for this basename: AST, ALT, ALKPHOS, BILITOT, PROT, ALBUMIN,  in the last 168 hours No results found for this basename: LIPASE, AMYLASE,  in the last 168 hours No results found for this basename: AMMONIA,  in the last 168 hours CBC:  Recent Labs Lab 01/06/14 1958 01/08/14 0550  WBC 7.9 6.0  HGB 11.3* 10.3*  HCT 34.3* 31.2*  MCV 92.5 92.3  PLT 182 177   Cardiac Enzymes: No results found for this basename: CKTOTAL, CKMB, CKMBINDEX, TROPONINI,  in the last 168 hours BNP: No components  found with this basename: POCBNP,  CBG: No results found for this basename: GLUCAP,  in the last 168 hours  No results found for this or any previous visit (from the past 240 hour(s)).   Scheduled Meds: . amitriptyline  50 mg Oral QHS  . amLODipine  10 mg Oral Daily  . calcium carbonate  1 tablet Oral Q breakfast  . dipyridamole-aspirin  1 capsule Oral Daily  . docusate sodium  100 mg Oral BID  . enoxaparin (LOVENOX) injection  70 mg Subcutaneous Q24H  . fenofibrate  54 mg Oral Daily   . gabapentin  100 mg Oral BID  . metoprolol succinate  25 mg Oral Daily  . pantoprazole  40 mg Oral Daily  . simvastatin  10 mg Oral q1800  . Warfarin - Pharmacist Dosing Inpatient   Does not apply q1800   Continuous Infusions:    Tere Mcconaughey, DO  [facility] Pager [phone]  If 7PM-7AM, please contact night-coverage [email or web address] Password [email or web address] 01/08/2014, 6:31 PM   LOS: 2 days

## 2014-01-08 NOTE — Progress Notes (Signed)
Responded to page to assist patient with completion and notarizing  Of Advance Directive.  Copies were made  For chart and patient. Provided emotion and spiritual support to patient. Listened empathically to patient and offered encouragement. Will past on to unit chaplain for continued support. support.   01/08/14 1300  Clinical Encounter Type  Visited With Patient and family together;Health care provider  Visit Type Spiritual support;Social support  Referral From Patient;Nurse  Spiritual Encounters  Spiritual Needs Prayer;Emotional  Stress Factors  Patient Stress Factors None identified  Family Stress Factors None identified  Advance Directives (For Healthcare)  Advance Directive Patient has advance directive, copy in chart  Jaclynn Major, [place]

## 2014-01-09 DIAGNOSIS — G3184 Mild cognitive impairment, so stated: Secondary | ICD-10-CM | POA: Diagnosis not present

## 2014-01-09 DIAGNOSIS — R5381 Other malaise: Secondary | ICD-10-CM | POA: Diagnosis not present

## 2014-01-09 DIAGNOSIS — F329 Major depressive disorder, single episode, unspecified: Secondary | ICD-10-CM | POA: Diagnosis not present

## 2014-01-09 DIAGNOSIS — I10 Essential (primary) hypertension: Secondary | ICD-10-CM | POA: Diagnosis not present

## 2014-01-09 DIAGNOSIS — F3289 Other specified depressive episodes: Secondary | ICD-10-CM | POA: Diagnosis not present

## 2014-01-09 DIAGNOSIS — R279 Unspecified lack of coordination: Secondary | ICD-10-CM | POA: Diagnosis not present

## 2014-01-09 DIAGNOSIS — Z5189 Encounter for other specified aftercare: Secondary | ICD-10-CM | POA: Diagnosis not present

## 2014-01-09 DIAGNOSIS — I824Z9 Acute embolism and thrombosis of unspecified deep veins of unspecified distal lower extremity: Secondary | ICD-10-CM | POA: Diagnosis not present

## 2014-01-09 DIAGNOSIS — S5290XD Unspecified fracture of unspecified forearm, subsequent encounter for closed fracture with routine healing: Secondary | ICD-10-CM | POA: Diagnosis not present

## 2014-01-09 DIAGNOSIS — E785 Hyperlipidemia, unspecified: Secondary | ICD-10-CM | POA: Diagnosis not present

## 2014-01-09 DIAGNOSIS — Z9181 History of falling: Secondary | ICD-10-CM | POA: Diagnosis not present

## 2014-01-09 DIAGNOSIS — S62109A Fracture of unspecified carpal bone, unspecified wrist, initial encounter for closed fracture: Secondary | ICD-10-CM | POA: Diagnosis not present

## 2014-01-09 DIAGNOSIS — IMO0001 Reserved for inherently not codable concepts without codable children: Secondary | ICD-10-CM | POA: Diagnosis not present

## 2014-01-09 DIAGNOSIS — S52599A Other fractures of lower end of unspecified radius, initial encounter for closed fracture: Secondary | ICD-10-CM | POA: Diagnosis not present

## 2014-01-09 DIAGNOSIS — N183 Chronic kidney disease, stage 3 unspecified: Secondary | ICD-10-CM | POA: Diagnosis not present

## 2014-01-09 DIAGNOSIS — I82409 Acute embolism and thrombosis of unspecified deep veins of unspecified lower extremity: Secondary | ICD-10-CM | POA: Diagnosis not present

## 2014-01-09 LAB — CBC
HCT: 34.6 % — ABNORMAL LOW (ref 36.0–46.0)
Hemoglobin: 11.6 g/dL — ABNORMAL LOW (ref 12.0–15.0)
MCH: 30.5 pg (ref 26.0–34.0)
MCHC: 33.5 g/dL (ref 30.0–36.0)
MCV: 91.1 fL (ref 78.0–100.0)
PLATELETS: 184 10*3/uL (ref 150–400)
RBC: 3.8 MIL/uL — AB (ref 3.87–5.11)
RDW: 13.8 % (ref 11.5–15.5)
WBC: 6.1 10*3/uL (ref 4.0–10.5)

## 2014-01-09 LAB — BASIC METABOLIC PANEL
BUN: 18 mg/dL (ref 6–23)
CALCIUM: 9 mg/dL (ref 8.4–10.5)
CO2: 25 mEq/L (ref 19–32)
Chloride: 103 mEq/L (ref 96–112)
Creatinine, Ser: 1.23 mg/dL — ABNORMAL HIGH (ref 0.50–1.10)
GFR calc Af Amer: 46 mL/min — ABNORMAL LOW (ref >90)
GFR calc non Af Amer: 39 mL/min — ABNORMAL LOW (ref >90)
GLUCOSE: 96 mg/dL (ref 70–99)
Potassium: 3.3 mEq/L — ABNORMAL LOW (ref 3.7–5.3)
Sodium: 142 mEq/L (ref 137–147)

## 2014-01-09 LAB — PROTIME-INR
INR: 1.64 — ABNORMAL HIGH (ref 0.00–1.49)
Prothrombin Time: 19 seconds — ABNORMAL HIGH (ref 11.6–15.2)

## 2014-01-09 LAB — FACTOR 5 LEIDEN

## 2014-01-09 LAB — PROTHROMBIN GENE MUTATION

## 2014-01-09 MED ORDER — HYDROCODONE-ACETAMINOPHEN 5-325 MG PO TABS
1.0000 | ORAL_TABLET | Freq: Four times a day (QID) | ORAL | Status: DC | PRN
Start: 2014-01-09 — End: 2014-03-10

## 2014-01-09 MED ORDER — POTASSIUM CHLORIDE CRYS ER 20 MEQ PO TBCR
20.0000 meq | EXTENDED_RELEASE_TABLET | Freq: Once | ORAL | Status: AC
  Administered 2014-01-09: 20 meq via ORAL
  Filled 2014-01-09: qty 1

## 2014-01-09 MED ORDER — ENOXAPARIN SODIUM 80 MG/0.8ML ~~LOC~~ SOLN: 70.0000 mg | SUBCUTANEOUS | Status: DC

## 2014-01-09 MED ORDER — WARFARIN SODIUM 2.5 MG PO TABS: 2.5000 mg | ORAL_TABLET | Freq: Every day | ORAL | Status: DC

## 2014-01-09 NOTE — Progress Notes (Signed)
Occupational Therapy Treatment Patient Details Name: MAGALLY VAHLE MRN: [id number] DOB: 02-01-1928 Today's Date: 01/09/2014 Time: 7341-9379 OT Time Calculation (min): 18 min  OT Assessment / Plan / Recommendation  History of present illness SHAUNI HENNER is a 80 y.o. female  Directly admitted after doppler showed right leg DVT posterior tibial.  [place] several months ago and has had right leg pain since then. No edema. No CP, SHOB or palpitations. No immobility, long trips, surgery.  Has fallen multiple times recently. Fractured right wrist several days ago, splinted and sling.  Lives alone. Family has been talking about placement. Also h/o mild dementia and gets confused occasionally   OT comments  Pt making progress with functional goals and will d/c to SNF today for short term rehab before d/c home  Follow Up Recommendations  SNF;Supervision/Assistance - 24 hour    Barriers to Discharge   none    Equipment Recommendations    TBD at next level of care   Recommendations for Other Services    Frequency     Progress towards OT Goals Progress towards OT goals: Progressing toward goals  Plan Discharge plan remains appropriate    Precautions / Restrictions Precautions Precautions: Fall Precaution Comments: fell at home twice recently; once standing to take off pants, once negotiating steps to put out trash at apt complex Restrictions Weight Bearing Restrictions: Yes RUE Weight Bearing: Non weight bearing Other Position/Activity Restrictions: Wrist fx, forearm in splint and sling   Pertinent Vitals/Pain Did not rate pain but stated that it was not hurting much    ADL  Grooming: Brushing hair;Simulated;Supervision/safety;Min guard Where Assessed - Grooming: Unsupported standing Transfers/Ambulation Related to ADLs: pt unsteady and impulsive, cues to slow her pace ADL Comments: min guard A - sup with standing tasks. Pt able to verbalize correct  UB ADL/dressing techniques and proper  sling wear     OT Diagnosis:    OT Problem List:   OT Treatment Interventions:     OT Goals(current goals can now be found in the care plan section)    Visit Information  Last OT Received On: 01/09/14 History of Present Illness: ARSEMA TUSING is a 80 y.o. female  Directly admitted after doppler showed right leg DVT posterior tibial.  [place] several months ago and has had right leg pain since then. No edema. No CP, SHOB or palpitations. No immobility, long trips, surgery.  Has fallen multiple times recently. Fractured right wrist several days ago, splinted and sling.  Lives alone. Family has been talking about placement. Also h/o mild dementia and gets confused occasionally    Subjective Data      Prior Functioning       Cognition  Cognition Arousal/Alertness: Awake/alert Behavior During Therapy: WFL for tasks assessed/performed Overall Cognitive Status: Within Functional Limits for tasks assessed Memory: Decreased short-term memory    Mobility  Bed Mobility General bed mobility comments: pt up ambulating coming from bathroom upon enteing room Transfers Overall transfer level: Needs assistance Equipment used: 1 person hand held assist Transfers: Sit to/from Stand Sit to Stand: Supervision;Min guard General transfer comment: pt unsteady and impulsive, cues to slow her pace          Balance Balance Sitting-balance support: Single extremity supported;Feet supported Sitting balance-Leahy Scale: Good Standing balance support: Single extremity supported;No upper extremity supported;During functional activity Standing balance-Leahy Scale: Fair  End of Session OT - End of Session Equipment Utilized During Treatment: Gait belt Activity Tolerance: Patient tolerated treatment well Patient left:  in chair;with family/visitor present  GO     Britt Bottom 01/09/2014, 12:27 PM

## 2014-01-09 NOTE — Progress Notes (Signed)
01/09/2014 patient went to [facility], was transported by sister was stable when left the hospital. Report was given to nurse at facility and was told patient was a fall risk. Tony Friscia.

## 2014-01-09 NOTE — Discharge Instructions (Signed)
Information on my medicine - Coumadin   (Warfarin)  This medication education was reviewed with me or my healthcare representative as part of my discharge preparation.  The pharmacist that spoke with me during my hospital stay was:  Diamonique Ruedas, [place], [facility]  Why was Coumadin prescribed for you? Coumadin was prescribed for you because you have a blood clot or a medical condition that can cause an increased risk of forming blood clots. Blood clots can cause serious health problems by blocking the flow of blood to the heart, lung, or brain. Coumadin can prevent harmful blood clots from forming. As a reminder your indication for Coumadin is:   Deep Vein Thrombosis Treatment  What test will check on my response to Coumadin? While on Coumadin (warfarin) you will need to have an INR test regularly to ensure that your dose is keeping you in the desired range. The INR (international normalized ratio) number is calculated from the result of the laboratory test called prothrombin time (PT).  If an INR APPOINTMENT HAS NOT ALREADY BEEN MADE FOR YOU please schedule an appointment to have this lab work done by your health care provider within 7 days. Your INR goal is usually a number between:  2 to 3 or your provider may give you a more narrow range like 2-2.5.  Ask your health care provider during an office visit what your goal INR is.  What  do you need to  know  About  COUMADIN? Take Coumadin (warfarin) exactly as prescribed by your healthcare provider about the same time each day.  DO NOT stop taking without talking to the doctor who prescribed the medication.  Stopping without other blood clot prevention medication to take the place of Coumadin may increase your risk of developing a new clot or stroke.  Get refills before you run out.  What do you do if you miss a dose? If you miss a dose, take it as soon as you remember on the same day then continue your regularly scheduled regimen the next day.  Do  not take two doses of Coumadin at the same time.  Important Safety Information A possible side effect of Coumadin (Warfarin) is an increased risk of bleeding. You should call your healthcare provider right away if you experience any of the following:   Bleeding from an injury or your nose that does not stop.   Unusual colored urine (red or dark brown) or unusual colored stools (red or black).   Unusual bruising for unknown reasons.   A serious fall or if you hit your head (even if there is no bleeding).  Some foods or medicines interact with Coumadin (warfarin) and might alter your response to warfarin. To help avoid this:   Eat a balanced diet, maintaining a consistent amount of Vitamin K.   Notify your provider about major diet changes you plan to make.   Avoid alcohol or limit your intake to 1 drink for women and 2 drinks for men per day. (1 drink is 5 oz. wine, 12 oz. beer, or 1.5 oz. liquor.)  Make sure that ANY health care provider who prescribes medication for you knows that you are taking Coumadin (warfarin).  Also make sure the healthcare provider who is monitoring your Coumadin knows when you have started a new medication including herbals and non-prescription products.  Coumadin (Warfarin)  Major Drug Interactions  Increased Warfarin Effect Decreased Warfarin Effect  Alcohol (large quantities) Antibiotics (esp. Septra/Bactrim, Flagyl, Cipro) Amiodarone (Cordarone) Aspirin (ASA) Cimetidine (  Tagamet) Megestrol (Megace) NSAIDs (ibuprofen, naproxen, etc.) Piroxicam (Feldene) Propafenone (Rythmol SR) Propranolol (Inderal) Isoniazid (INH) Posaconazole (Noxafil) Barbiturates (Phenobarbital) Carbamazepine (Tegretol) Chlordiazepoxide (Librium) Cholestyramine (Questran) Griseofulvin Oral Contraceptives Rifampin Sucralfate (Carafate) Vitamin K   Coumadin (Warfarin) Major Herbal Interactions  Increased Warfarin Effect Decreased Warfarin Effect  Garlic Ginseng Ginkgo  biloba Coenzyme Q10 Green tea St. Johns wort    Coumadin (Warfarin) FOOD Interactions  Eat a consistent number of servings per week of foods HIGH in Vitamin K (1 serving =  cup)  Collards (cooked, or boiled & drained) Kale (cooked, or boiled & drained) Mustard greens (cooked, or boiled & drained) Parsley *serving size only =  cup Spinach (cooked, or boiled & drained) Swiss chard (cooked, or boiled & drained) Turnip greens (cooked, or boiled & drained)  Eat a consistent number of servings per week of foods MEDIUM-HIGH in Vitamin K (1 serving = 1 cup)  Asparagus (cooked, or boiled & drained) Broccoli (cooked, boiled & drained, or raw & chopped) Brussel sprouts (cooked, or boiled & drained) *serving size only =  cup Lettuce, raw (green leaf, endive, romaine) Spinach, raw Turnip greens, raw & chopped   These websites have more information on Coumadin (warfarin):  FailFactory.se; VeganReport.com.au;

## 2014-01-09 NOTE — Discharge Summary (Signed)
Physician Discharge Summary  Brittany Hughes:[id number] DOB: 20-May-1928 DOA: 01/06/2014  PCP: Oneal Grout, MD  Admit date: 01/06/2014 Discharge date: 01/09/2014  Recommendations for Outpatient Follow-up:  1. Pt will need to follow up with PCP in 2 weeks post discharge 2. Please obtain BMP to evaluate electrolytes and kidney function 3. Please also check CBC to evaluate Hg and Hct levels 4. Please check INR on 01/12/14 and adjust coumadin dose accordingly for INR 2-3 5. Discontinue lovenox when INR is 2-3  Discharge Diagnoses:  Acute right lower extremity DVT  -Appears unprovoked  -Given the patient's 2 recent falls, I have discussed the risks, benefits, and alternatives of starting the patient on anticoagulation versus performing an IVC filter including but not limited to recurrent fall with resultant life threatening bleeding due to trauma  -The family has expressed understanding and agreed upon starting the patient on warfarin with Lovenox bridge  -Given the patient's falls and renal insufficiency, hesitate to start on Factor Xa antagonist  -Previously ordered lupus anticoagulant, antithrombin III, protein C and protein S are unremarkable  -pt will continue coumadin 2.5mg  daily with INR check on 01/12/14.  She will continue lovenox bridge (70mg  daily) until INR is between 2-3 -Goal INR 2-3 -d/c aggrenox since pt is now on warfarin -There was no active bleeding or bleeding complications during the hospitalization Hypertension  -Continue amlodipine and metoprolol succinate  -Controlled throughout the hospitalization Peripheral neuropathy  -Continue gabapentin  Hyperlipidemia  -continue statin  Deconditioning  -PT recommend skilled nursing facility  -Patient and family agree  Right Wrist fracture  -continue sling and splint  -A splint was placed prior to the patient's hospitalization -The patient will need to followup with her orthopedist in the outpatient setting -She remained  neurovascularly intact Family Communication: Daughter updated at the bedside Disposition Plan: SNF on 01/09/14   Discharge Condition: Stable  Disposition:  skilled nursing facility  Diet: Heart healthy Wt Readings from Last 3 Encounters:  01/08/14 69.536 kg (153 lb 4.8 oz)  01/06/14 69.673 kg (153 lb 9.6 oz)  12/31/13 70.761 kg (156 lb)    History of present illness:   80 y.o. female  Directly admitted after doppler showed right leg DVT posterior tibial. Fell several months ago and has had right leg pain since then. No edema. No CP, SOB or palpitations. No immobility, long trips, surgery.  Fractured right wrist several days ago, splinted and sling. Lives alone. Family has been talking about placement. Also h/o mild dementia and gets confused occasionally. There's been no recent history of long travels, surgery or new medications. The patient was admitted to the hospital and started on Lovenox. After long discussion with the patient's family regard to risks, benefits, and alternatives, they understood and agreed to start the patient on warfarin. Physical therapy was consulted. They recommended skilled nursing facility. The patient's family agreed. Social work was consulted to assist with transfer.    Discharge Exam: Filed Vitals:   01/09/14 0900  BP: 130/61  Pulse: 72  Temp: 98.4 F (36.9 C)  Resp: 20   Filed Vitals:   01/08/14 1317 01/08/14 2113 01/09/14 0448 01/09/14 0900  BP: 110/85 135/46 145/68 130/61  Pulse: 75 66 65 72  Temp: 98.4 F (36.9 C) 98.8 F (37.1 C) 98.6 F (37 C) 98.4 F (36.9 C)  TempSrc: Oral Oral Oral Oral  Resp: 18 20  20   Height:      Weight:  69.536 kg (153 lb 4.8 oz)    SpO2:  98% 98% 97% 98%   General: Alert and awake, NAD, pleasant, cooperative Cardiovascular: RRR, no rub, no gallop, no S3 Respiratory: CTAB, no wheeze, no rhonchi Abdomen:soft, nontender, nondistended, positive bowel sounds Extremities: No edema, No lymphangitis, no  petechiae  Discharge Instructions      Discharge Orders   Future Appointments Provider Department Dept Phone   03/11/2014 9:45 AM Oneal Grout, MD [facility] [phone]   10/09/2014 9:15 AM Sherrie George, MD [facility] [phone]   Future Orders Complete By Expires   Diet - low sodium heart healthy  As directed    Increase activity slowly  As directed        Medication List    STOP taking these medications       dipyridamole-aspirin 200-25 MG per 12 hr capsule  Commonly known as:  AGGRENOX      TAKE these medications       amitriptyline 50 MG tablet  Commonly known as:  ELAVIL  Take 50 mg by mouth at bedtime. Take one tablet at bedtime     amLODipine 10 MG tablet  Commonly known as:  NORVASC  Take 10 mg by mouth daily.     calcium carbonate 600 MG Tabs tablet  Commonly known as:  OS-CAL  Take 600 mg by mouth 2 (two) times daily with a meal. Take one tablet once in the morning and one in the evening     dicyclomine 10 MG capsule  Commonly known as:  BENTYL  Take 10 mg by mouth 4 (four) times daily -  before meals and at bedtime.     enoxaparin 80 MG/0.8ML injection  Commonly known as:  LOVENOX  Inject 0.7 mLs (70 mg total) into the skin daily. Discontinue when INR is 2-3     ergocalciferol 50000 UNITS capsule  Commonly known as:  VITAMIN D2  Take 50,000 Units by mouth once a week.     fenofibrate 54 MG tablet  Take 54 mg by mouth daily. Take one tablet once a day for cholesterol     flavoxATE 100 MG tablet  Commonly known as:  URISPAS  Take 100 mg by mouth 3 (three) times daily as needed for bladder spasms.     gabapentin 100 MG capsule  Commonly known as:  NEURONTIN  Take 100 mg by mouth daily as needed.     HYDROcodone-acetaminophen 5-325 MG per tablet  Commonly known as:  NORCO  Take 1 tablet by mouth every 6 (six) hours as needed for moderate pain.     metoprolol succinate 25 MG 24 hr tablet  Commonly known  as:  TOPROL-XL  Take 25 mg by mouth daily. Take one tablet once a day for blood pressure     omeprazole 20 MG capsule  Commonly known as:  PRILOSEC  Take 20 mg by mouth 2 (two) times daily before a meal.     pentazocine-naloxone 50-0.5 MG per tablet  Commonly known as:  TALWIN NX  Take 1 tablet by mouth daily as needed for moderate pain.     promethazine 25 MG tablet  Commonly known as:  PHENERGAN  Take 25 mg by mouth daily as needed.     simvastatin 10 MG tablet  Commonly known as:  ZOCOR  Take 10 mg by mouth daily at 6 PM. Take one tablet once a day for cholesterol     warfarin 2.5 MG tablet  Commonly known as:  COUMADIN  Take 1 tablet (  2.5 mg total) by mouth daily.         The results of significant diagnostics from this hospitalization (including imaging, microbiology, ancillary and laboratory) are listed below for reference.    Significant Diagnostic Studies: Dg Wrist Complete Right  01/06/2014   CLINICAL DATA:  Right wrist pain after fall.  EXAM: RIGHT WRIST - COMPLETE 3+ VIEW  COMPARISON:  None.  FINDINGS: Minimally displaced fracture is seen involving the distal radial metaphysis. Mildly displaced ulnar styloid fracture is noted as well.  IMPRESSION: Minimally displaced distal radial fracture. Mildly displaced ulnar styloid fracture.   Electronically Signed   By: Roque Lias M.D.   On: 01/06/2014 14:55     Microbiology: No results found for this or any previous visit (from the past 240 hour(s)).   Labs: Basic Metabolic Panel:  Recent Labs Lab 01/06/14 1958 01/09/14 0440  NA 141 142  K 4.3 3.3*  CL 104 103  CO2 21 25  GLUCOSE 144* 96  BUN 20 18  CREATININE 1.44* 1.23*  CALCIUM 9.2 9.0   Liver Function Tests: No results found for this basename: AST, ALT, ALKPHOS, BILITOT, PROT, ALBUMIN,  in the last 168 hours No results found for this basename: LIPASE, AMYLASE,  in the last 168 hours No results found for this basename: AMMONIA,  in the last 168  hours CBC:  Recent Labs Lab 01/06/14 1958 01/08/14 0550 01/09/14 0440  WBC 7.9 6.0 6.1  HGB 11.3* 10.3* 11.6*  HCT 34.3* 31.2* 34.6*  MCV 92.5 92.3 91.1  PLT 182 177 184   Cardiac Enzymes: No results found for this basename: CKTOTAL, CKMB, CKMBINDEX, TROPONINI,  in the last 168 hours BNP: No components found with this basename: POCBNP,  CBG: No results found for this basename: GLUCAP,  in the last 168 hours  Time coordinating discharge:  Greater than 30 minutes  Signed:  Shawneen Deetz, DO [facility] Pager: [phone] 01/09/2014, 10:48 AM

## 2014-01-10 DIAGNOSIS — R279 Unspecified lack of coordination: Secondary | ICD-10-CM | POA: Diagnosis not present

## 2014-01-10 DIAGNOSIS — F329 Major depressive disorder, single episode, unspecified: Secondary | ICD-10-CM | POA: Diagnosis not present

## 2014-01-10 DIAGNOSIS — I824Z9 Acute embolism and thrombosis of unspecified deep veins of unspecified distal lower extremity: Secondary | ICD-10-CM | POA: Diagnosis not present

## 2014-01-10 DIAGNOSIS — S62109A Fracture of unspecified carpal bone, unspecified wrist, initial encounter for closed fracture: Secondary | ICD-10-CM | POA: Diagnosis not present

## 2014-01-10 DIAGNOSIS — F3289 Other specified depressive episodes: Secondary | ICD-10-CM | POA: Diagnosis not present

## 2014-01-15 DIAGNOSIS — S52599A Other fractures of lower end of unspecified radius, initial encounter for closed fracture: Secondary | ICD-10-CM | POA: Diagnosis not present

## 2014-01-22 DIAGNOSIS — S5290XD Unspecified fracture of unspecified forearm, subsequent encounter for closed fracture with routine healing: Secondary | ICD-10-CM | POA: Diagnosis not present

## 2014-02-19 DIAGNOSIS — S5290XD Unspecified fracture of unspecified forearm, subsequent encounter for closed fracture with routine healing: Secondary | ICD-10-CM | POA: Diagnosis not present

## 2014-02-25 DIAGNOSIS — IMO0001 Reserved for inherently not codable concepts without codable children: Secondary | ICD-10-CM | POA: Diagnosis not present

## 2014-02-25 DIAGNOSIS — Z87891 Personal history of nicotine dependence: Secondary | ICD-10-CM | POA: Diagnosis not present

## 2014-02-25 DIAGNOSIS — I82409 Acute embolism and thrombosis of unspecified deep veins of unspecified lower extremity: Secondary | ICD-10-CM | POA: Diagnosis not present

## 2014-02-25 DIAGNOSIS — G609 Hereditary and idiopathic neuropathy, unspecified: Secondary | ICD-10-CM | POA: Diagnosis not present

## 2014-02-25 DIAGNOSIS — F02818 Dementia in other diseases classified elsewhere, unspecified severity, with other behavioral disturbance: Secondary | ICD-10-CM | POA: Diagnosis not present

## 2014-02-25 DIAGNOSIS — F0280 Dementia in other diseases classified elsewhere without behavioral disturbance: Secondary | ICD-10-CM | POA: Diagnosis not present

## 2014-02-25 DIAGNOSIS — I2510 Atherosclerotic heart disease of native coronary artery without angina pectoris: Secondary | ICD-10-CM

## 2014-02-25 DIAGNOSIS — Z7901 Long term (current) use of anticoagulants: Secondary | ICD-10-CM | POA: Diagnosis not present

## 2014-02-25 DIAGNOSIS — Z9181 History of falling: Secondary | ICD-10-CM | POA: Diagnosis not present

## 2014-02-25 DIAGNOSIS — F0281 Dementia in other diseases classified elsewhere with behavioral disturbance: Secondary | ICD-10-CM | POA: Diagnosis not present

## 2014-02-25 DIAGNOSIS — Z5181 Encounter for therapeutic drug level monitoring: Secondary | ICD-10-CM | POA: Diagnosis not present

## 2014-02-25 DIAGNOSIS — I129 Hypertensive chronic kidney disease with stage 1 through stage 4 chronic kidney disease, or unspecified chronic kidney disease: Secondary | ICD-10-CM | POA: Diagnosis not present

## 2014-02-25 DIAGNOSIS — N189 Chronic kidney disease, unspecified: Secondary | ICD-10-CM | POA: Diagnosis not present

## 2014-02-26 DIAGNOSIS — I82409 Acute embolism and thrombosis of unspecified deep veins of unspecified lower extremity: Secondary | ICD-10-CM | POA: Diagnosis not present

## 2014-02-26 DIAGNOSIS — G609 Hereditary and idiopathic neuropathy, unspecified: Secondary | ICD-10-CM | POA: Diagnosis not present

## 2014-02-26 DIAGNOSIS — IMO0001 Reserved for inherently not codable concepts without codable children: Secondary | ICD-10-CM | POA: Diagnosis not present

## 2014-02-26 DIAGNOSIS — N189 Chronic kidney disease, unspecified: Secondary | ICD-10-CM | POA: Diagnosis not present

## 2014-02-26 DIAGNOSIS — I129 Hypertensive chronic kidney disease with stage 1 through stage 4 chronic kidney disease, or unspecified chronic kidney disease: Secondary | ICD-10-CM | POA: Diagnosis not present

## 2014-02-26 DIAGNOSIS — I2510 Atherosclerotic heart disease of native coronary artery without angina pectoris: Secondary | ICD-10-CM | POA: Diagnosis not present

## 2014-03-02 ENCOUNTER — Telehealth: Payer: Self-pay | Admitting: *Deleted

## 2014-03-02 DIAGNOSIS — I82409 Acute embolism and thrombosis of unspecified deep veins of unspecified lower extremity: Secondary | ICD-10-CM | POA: Diagnosis not present

## 2014-03-02 DIAGNOSIS — G609 Hereditary and idiopathic neuropathy, unspecified: Secondary | ICD-10-CM | POA: Diagnosis not present

## 2014-03-02 DIAGNOSIS — IMO0001 Reserved for inherently not codable concepts without codable children: Secondary | ICD-10-CM | POA: Diagnosis not present

## 2014-03-02 DIAGNOSIS — Z79899 Other long term (current) drug therapy: Secondary | ICD-10-CM | POA: Diagnosis not present

## 2014-03-02 DIAGNOSIS — N189 Chronic kidney disease, unspecified: Secondary | ICD-10-CM | POA: Diagnosis not present

## 2014-03-02 DIAGNOSIS — I2510 Atherosclerotic heart disease of native coronary artery without angina pectoris: Secondary | ICD-10-CM | POA: Diagnosis not present

## 2014-03-02 DIAGNOSIS — I129 Hypertensive chronic kidney disease with stage 1 through stage 4 chronic kidney disease, or unspecified chronic kidney disease: Secondary | ICD-10-CM | POA: Diagnosis not present

## 2014-03-02 LAB — PROTIME-INR

## 2014-03-02 NOTE — Telephone Encounter (Signed)
[facility] faxed Protime Results 03/02/2014  PT:  24.1 INR: 2.22  Current Dose of Coumadin: 1mg  once daily (I called Beth with [facility] to verify dosage) Per Dr. Reed--Continue same dose of Coumadin and recheck in 1 Week.  Beth with [facility] Notified.

## 2014-03-03 DIAGNOSIS — IMO0001 Reserved for inherently not codable concepts without codable children: Secondary | ICD-10-CM | POA: Diagnosis not present

## 2014-03-03 DIAGNOSIS — I129 Hypertensive chronic kidney disease with stage 1 through stage 4 chronic kidney disease, or unspecified chronic kidney disease: Secondary | ICD-10-CM | POA: Diagnosis not present

## 2014-03-03 DIAGNOSIS — N189 Chronic kidney disease, unspecified: Secondary | ICD-10-CM | POA: Diagnosis not present

## 2014-03-03 DIAGNOSIS — I2510 Atherosclerotic heart disease of native coronary artery without angina pectoris: Secondary | ICD-10-CM | POA: Diagnosis not present

## 2014-03-03 DIAGNOSIS — I82409 Acute embolism and thrombosis of unspecified deep veins of unspecified lower extremity: Secondary | ICD-10-CM | POA: Diagnosis not present

## 2014-03-03 DIAGNOSIS — G609 Hereditary and idiopathic neuropathy, unspecified: Secondary | ICD-10-CM | POA: Diagnosis not present

## 2014-03-05 ENCOUNTER — Telehealth: Payer: Self-pay

## 2014-03-05 DIAGNOSIS — I129 Hypertensive chronic kidney disease with stage 1 through stage 4 chronic kidney disease, or unspecified chronic kidney disease: Secondary | ICD-10-CM | POA: Diagnosis not present

## 2014-03-05 DIAGNOSIS — N189 Chronic kidney disease, unspecified: Secondary | ICD-10-CM | POA: Diagnosis not present

## 2014-03-05 DIAGNOSIS — I2510 Atherosclerotic heart disease of native coronary artery without angina pectoris: Secondary | ICD-10-CM | POA: Diagnosis not present

## 2014-03-05 DIAGNOSIS — IMO0001 Reserved for inherently not codable concepts without codable children: Secondary | ICD-10-CM | POA: Diagnosis not present

## 2014-03-05 DIAGNOSIS — G609 Hereditary and idiopathic neuropathy, unspecified: Secondary | ICD-10-CM | POA: Diagnosis not present

## 2014-03-05 DIAGNOSIS — I82409 Acute embolism and thrombosis of unspecified deep veins of unspecified lower extremity: Secondary | ICD-10-CM | POA: Diagnosis not present

## 2014-03-05 NOTE — Telephone Encounter (Signed)
Have her come to office for inr check. If her inr is between 2-3 , then she can continue therapy and i will see her on 03/11/14 as scheduled. If inr is less than 2, will need to adjust her coumadin

## 2014-03-05 NOTE — Telephone Encounter (Signed)
Gie from [facility] was calling to inform Dr.Pandey patient has pain in her calf when doing physical therapy. Patient with diagnosis of clot in leg. The Physical Therapist questions if ambulation should be restricted or if Dr.Pandey would like to see patient sooner than pending appointment on 03/11/14  Please advise

## 2014-03-06 NOTE — Telephone Encounter (Signed)
lft message to rtn call with GIE @ [place].

## 2014-03-09 DIAGNOSIS — IMO0001 Reserved for inherently not codable concepts without codable children: Secondary | ICD-10-CM | POA: Diagnosis not present

## 2014-03-09 DIAGNOSIS — I82409 Acute embolism and thrombosis of unspecified deep veins of unspecified lower extremity: Secondary | ICD-10-CM | POA: Diagnosis not present

## 2014-03-09 DIAGNOSIS — N189 Chronic kidney disease, unspecified: Secondary | ICD-10-CM | POA: Diagnosis not present

## 2014-03-09 DIAGNOSIS — I129 Hypertensive chronic kidney disease with stage 1 through stage 4 chronic kidney disease, or unspecified chronic kidney disease: Secondary | ICD-10-CM | POA: Diagnosis not present

## 2014-03-09 DIAGNOSIS — I2510 Atherosclerotic heart disease of native coronary artery without angina pectoris: Secondary | ICD-10-CM | POA: Diagnosis not present

## 2014-03-09 DIAGNOSIS — G609 Hereditary and idiopathic neuropathy, unspecified: Secondary | ICD-10-CM | POA: Diagnosis not present

## 2014-03-09 DIAGNOSIS — Z7901 Long term (current) use of anticoagulants: Secondary | ICD-10-CM | POA: Diagnosis not present

## 2014-03-09 LAB — PROTIME-INR

## 2014-03-09 NOTE — Telephone Encounter (Signed)
I spoke with patient, patient states Physical Therapist will come out today and they will do PT/INR check and call or fax results. Patient states she has not had any leg pain since she told the therapist that the last time.

## 2014-03-10 ENCOUNTER — Telehealth: Payer: Self-pay | Admitting: *Deleted

## 2014-03-10 ENCOUNTER — Emergency Department (HOSPITAL_COMMUNITY): Payer: Medicare Other

## 2014-03-10 ENCOUNTER — Emergency Department (HOSPITAL_COMMUNITY)
Admission: EM | Admit: 2014-03-10 | Discharge: 2014-03-10 | Disposition: A | Discharge status: Home / Self Care | Payer: Medicare Other | Attending: Emergency Medicine | Admitting: Emergency Medicine

## 2014-03-10 ENCOUNTER — Encounter (HOSPITAL_COMMUNITY): Payer: Self-pay | Admitting: Emergency Medicine

## 2014-03-10 DIAGNOSIS — Z85828 Personal history of other malignant neoplasm of skin: Secondary | ICD-10-CM | POA: Insufficient documentation

## 2014-03-10 DIAGNOSIS — E785 Hyperlipidemia, unspecified: Secondary | ICD-10-CM | POA: Insufficient documentation

## 2014-03-10 DIAGNOSIS — Z86718 Personal history of other venous thrombosis and embolism: Secondary | ICD-10-CM | POA: Diagnosis not present

## 2014-03-10 DIAGNOSIS — Z8742 Personal history of other diseases of the female genital tract: Secondary | ICD-10-CM | POA: Diagnosis not present

## 2014-03-10 DIAGNOSIS — Y92009 Unspecified place in unspecified non-institutional (private) residence as the place of occurrence of the external cause: Secondary | ICD-10-CM | POA: Insufficient documentation

## 2014-03-10 DIAGNOSIS — R404 Transient alteration of awareness: Secondary | ICD-10-CM | POA: Diagnosis not present

## 2014-03-10 DIAGNOSIS — F329 Major depressive disorder, single episode, unspecified: Secondary | ICD-10-CM | POA: Insufficient documentation

## 2014-03-10 DIAGNOSIS — S0990XA Unspecified injury of head, initial encounter: Secondary | ICD-10-CM | POA: Diagnosis not present

## 2014-03-10 DIAGNOSIS — W19XXXA Unspecified fall, initial encounter: Secondary | ICD-10-CM

## 2014-03-10 DIAGNOSIS — N189 Chronic kidney disease, unspecified: Secondary | ICD-10-CM | POA: Insufficient documentation

## 2014-03-10 DIAGNOSIS — Z8619 Personal history of other infectious and parasitic diseases: Secondary | ICD-10-CM | POA: Diagnosis not present

## 2014-03-10 DIAGNOSIS — M810 Age-related osteoporosis without current pathological fracture: Secondary | ICD-10-CM | POA: Insufficient documentation

## 2014-03-10 DIAGNOSIS — K219 Gastro-esophageal reflux disease without esophagitis: Secondary | ICD-10-CM | POA: Diagnosis not present

## 2014-03-10 DIAGNOSIS — T148XXA Other injury of unspecified body region, initial encounter: Secondary | ICD-10-CM

## 2014-03-10 DIAGNOSIS — Z8781 Personal history of (healed) traumatic fracture: Secondary | ICD-10-CM | POA: Insufficient documentation

## 2014-03-10 DIAGNOSIS — F0390 Unspecified dementia without behavioral disturbance: Secondary | ICD-10-CM | POA: Insufficient documentation

## 2014-03-10 DIAGNOSIS — R55 Syncope and collapse: Secondary | ICD-10-CM | POA: Diagnosis not present

## 2014-03-10 DIAGNOSIS — Z87891 Personal history of nicotine dependence: Secondary | ICD-10-CM | POA: Insufficient documentation

## 2014-03-10 DIAGNOSIS — Z7901 Long term (current) use of anticoagulants: Secondary | ICD-10-CM | POA: Insufficient documentation

## 2014-03-10 DIAGNOSIS — Z79899 Other long term (current) drug therapy: Secondary | ICD-10-CM | POA: Insufficient documentation

## 2014-03-10 DIAGNOSIS — Z87442 Personal history of urinary calculi: Secondary | ICD-10-CM | POA: Insufficient documentation

## 2014-03-10 DIAGNOSIS — S51009A Unspecified open wound of unspecified elbow, initial encounter: Secondary | ICD-10-CM | POA: Diagnosis not present

## 2014-03-10 DIAGNOSIS — Y9389 Activity, other specified: Secondary | ICD-10-CM | POA: Insufficient documentation

## 2014-03-10 DIAGNOSIS — F29 Unspecified psychosis not due to a substance or known physiological condition: Secondary | ICD-10-CM | POA: Insufficient documentation

## 2014-03-10 DIAGNOSIS — IMO0002 Reserved for concepts with insufficient information to code with codable children: Secondary | ICD-10-CM | POA: Insufficient documentation

## 2014-03-10 DIAGNOSIS — F3289 Other specified depressive episodes: Secondary | ICD-10-CM | POA: Insufficient documentation

## 2014-03-10 DIAGNOSIS — Z8669 Personal history of other diseases of the nervous system and sense organs: Secondary | ICD-10-CM | POA: Insufficient documentation

## 2014-03-10 DIAGNOSIS — I129 Hypertensive chronic kidney disease with stage 1 through stage 4 chronic kidney disease, or unspecified chronic kidney disease: Secondary | ICD-10-CM | POA: Insufficient documentation

## 2014-03-10 DIAGNOSIS — R296 Repeated falls: Secondary | ICD-10-CM | POA: Insufficient documentation

## 2014-03-10 DIAGNOSIS — R410 Disorientation, unspecified: Secondary | ICD-10-CM

## 2014-03-10 DIAGNOSIS — I10 Essential (primary) hypertension: Secondary | ICD-10-CM | POA: Diagnosis not present

## 2014-03-10 LAB — URINALYSIS, ROUTINE W REFLEX MICROSCOPIC
BILIRUBIN URINE: NEGATIVE
Glucose, UA: NEGATIVE mg/dL
HGB URINE DIPSTICK: NEGATIVE
Ketones, ur: NEGATIVE mg/dL
NITRITE: NEGATIVE
PH: 5.5 (ref 5.0–8.0)
Protein, ur: NEGATIVE mg/dL
SPECIFIC GRAVITY, URINE: 1.023 (ref 1.005–1.030)
Urobilinogen, UA: 0.2 mg/dL (ref 0.0–1.0)

## 2014-03-10 LAB — CBC WITH DIFFERENTIAL/PLATELET
Basophils Absolute: 0 10*3/uL (ref 0.0–0.1)
Basophils Relative: 0 % (ref 0–1)
Eosinophils Absolute: 0 10*3/uL (ref 0.0–0.7)
Eosinophils Relative: 0 % (ref 0–5)
HCT: 33.9 % — ABNORMAL LOW (ref 36.0–46.0)
HEMOGLOBIN: 11.4 g/dL — AB (ref 12.0–15.0)
LYMPHS ABS: 1.7 10*3/uL (ref 0.7–4.0)
LYMPHS PCT: 16 % (ref 12–46)
MCH: 30.3 pg (ref 26.0–34.0)
MCHC: 33.6 g/dL (ref 30.0–36.0)
MCV: 90.2 fL (ref 78.0–100.0)
MONOS PCT: 6 % (ref 3–12)
Monocytes Absolute: 0.7 10*3/uL (ref 0.1–1.0)
NEUTROS ABS: 8.3 10*3/uL — AB (ref 1.7–7.7)
NEUTROS PCT: 78 % — AB (ref 43–77)
Platelets: 150 10*3/uL (ref 150–400)
RBC: 3.76 MIL/uL — AB (ref 3.87–5.11)
RDW: 13.8 % (ref 11.5–15.5)
WBC: 10.8 10*3/uL — AB (ref 4.0–10.5)

## 2014-03-10 LAB — URINE MICROSCOPIC-ADD ON

## 2014-03-10 LAB — BASIC METABOLIC PANEL
BUN: 20 mg/dL (ref 6–23)
CHLORIDE: 103 meq/L (ref 96–112)
CO2: 25 mEq/L (ref 19–32)
Calcium: 9.5 mg/dL (ref 8.4–10.5)
Creatinine, Ser: 1.12 mg/dL — ABNORMAL HIGH (ref 0.50–1.10)
GFR calc Af Amer: 51 mL/min — ABNORMAL LOW (ref >90)
GFR calc non Af Amer: 44 mL/min — ABNORMAL LOW (ref >90)
Glucose, Bld: 108 mg/dL — ABNORMAL HIGH (ref 70–99)
POTASSIUM: 4 meq/L (ref 3.7–5.3)
Sodium: 142 mEq/L (ref 137–147)

## 2014-03-10 LAB — PROTIME-INR
INR: 1.53 — ABNORMAL HIGH (ref 0.00–1.49)
Prothrombin Time: 18 seconds — ABNORMAL HIGH (ref 11.6–15.2)

## 2014-03-10 NOTE — Telephone Encounter (Signed)
[facility] faxed results of Protime 03/09/2014  PT:  17.3 INR:  1.44  Current Dose of Coumadin: 1mg  once daily Per Dr. Sharol Harness Coumadin to 1.5mg  daily and recheck on 03/11/2014 Patient Notified on 03/09/2014 and tried calling sister # [phone] and left message with husband for her to return call yesterday. Spoke with her this morning and confirmed dosage and appointment for tomorrow.

## 2014-03-10 NOTE — ED Notes (Signed)
Pt's right elbow has small abrasion.

## 2014-03-10 NOTE — ED Notes (Signed)
The family is trying to find placement for pt. Social work may be needed
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
## 2014-03-10 NOTE — ED Notes (Signed)
Discharge and follow up instructions reviewed with pt and family. Pt verbalized understanding.

## 2014-03-10 NOTE — Discharge Instructions (Signed)
Fall Prevention and Home Safety Falls cause injuries and can affect all age groups. It is possible to use preventive measures to significantly decrease the likelihood of falls. There are many simple measures which can make your home safer and prevent falls. OUTDOORS  Repair cracks and edges of walkways and driveways.  Remove high doorway thresholds.  Trim shrubbery on the main path into your home.  Have good outside lighting.  Clear walkways of tools, rocks, debris, and clutter.  Check that handrails are not broken and are securely fastened. Both sides of steps should have handrails.  Have leaves, snow, and ice cleared regularly.  Use sand or salt on walkways during winter months.  In the garage, clean up grease or oil spills. BATHROOM  Install night lights.  Install grab bars by the toilet and in the tub and shower.  Use non-skid mats or decals in the tub or shower.  Place a plastic non-slip stool in the shower to sit on, if needed.  Keep floors dry and clean up all water on the floor immediately.  Remove soap buildup in the tub or shower on a regular basis.  Secure bath mats with non-slip, double-sided rug tape.  Remove throw rugs and tripping hazards from the floors. BEDROOMS  Install night lights.  Make sure a bedside light is easy to reach.  Do not use oversized bedding.  Keep a telephone by your bedside.  Have a firm chair with side arms to use for getting dressed.  Remove throw rugs and tripping hazards from the floor. KITCHEN  Keep handles on pots and pans turned toward the center of the stove. Use back burners when possible.  Clean up spills quickly and allow time for drying.  Avoid walking on wet floors.  Avoid hot utensils and knives.  Position shelves so they are not too high or low.  Place commonly used objects within easy reach.  If necessary, use a sturdy step stool with a grab bar when reaching.  Keep electrical cables out of the  way.  Do not use floor polish or wax that makes floors slippery. If you must use wax, use non-skid floor wax.  Remove throw rugs and tripping hazards from the floor. STAIRWAYS  Never leave objects on stairs.  Place handrails on both sides of stairways and use them. Fix any loose handrails. Make sure handrails on both sides of the stairways are as long as the stairs.  Check carpeting to make sure it is firmly attached along stairs. Make repairs to worn or loose carpet promptly.  Avoid placing throw rugs at the top or bottom of stairways, or properly secure the rug with carpet tape to prevent slippage. Get rid of throw rugs, if possible.  Have an electrician put in a light switch at the top and bottom of the stairs. OTHER FALL PREVENTION TIPS  Wear low-heel or rubber-soled shoes that are supportive and fit well. Wear closed toe shoes.  When using a stepladder, make sure it is fully opened and both spreaders are firmly locked. Do not climb a closed stepladder.  Add color or contrast paint or tape to grab bars and handrails in your home. Place contrasting color strips on first and last steps.  Learn and use mobility aids as needed. Install an electrical emergency response system.  Turn on lights to avoid dark areas. Replace light bulbs that burn out immediately. Get light switches that glow.  Arrange furniture to create clear pathways. Keep furniture in the same place.  Firmly attach carpet with non-skid or double-sided tape.  Eliminate uneven floor surfaces.  Select a carpet pattern that does not visually hide the edge of steps.  Be aware of all pets. OTHER HOME SAFETY TIPS  Set the water temperature for 120 F (48.8 C).  Keep emergency numbers on or near the telephone.  Keep smoke detectors on every level of the home and near sleeping areas. Document Released: 10/13/2002 Document Revised: 04/23/2012 Document Reviewed: 01/12/2012 [facility] Patient Information 2014  [place].  Abrasions An abrasion is a cut or scrape of the skin. Abrasions do not go through all layers of the skin. HOME CARE  If a bandage (dressing) was put on your wound, change it as told by your doctor. If the bandage sticks, soak it off with warm.  Wash the area with water and soap 2 times a day. Rinse off the soap. Pat the area dry with a clean towel.  Put on medicated cream (ointment) as told by your doctor.  Change your bandage right away if it gets wet or dirty.  Only take medicine as told by your doctor.  See your doctor within 24 48 hours to get your wound checked.  Check your wound for redness, puffiness (swelling), or yellowish-white fluid (pus). GET HELP RIGHT AWAY IF:   You have more pain in the wound.  You have redness, swelling, or tenderness around the wound.  You have pus coming from the wound.  You have a fever or lasting symptoms for more than 2 3 days.  You have a fever and your symptoms suddenly get worse.  You have a bad smell coming from the wound or bandage. MAKE SURE YOU:   Understand these instructions.  Will watch your condition.  Will get help right away if you are not doing well or get worse. Document Released: 04/10/2008 Document Revised: 07/17/2012 Document Reviewed: 09/26/2011 [facility] Patient Information 2014 [place], [place].  Confusion Confusion is the inability to think with your usual speed or clarity. Confusion may come on quickly or slowly over time. How quickly the confusion comes on depends on the cause. Confusion can be due to any number of causes. CAUSES   Concussion, head injury, or head trauma.  Seizures.  Stroke.  Fever.  Senility.  Heightened emotional states like rage or terror.  Mental illness in which the person loses the ability to determine what is real and what is not (hallucinations).  Infections.  Toxic effects from alcohol, drugs, or prescription medicines.  Dehydration and an imbalance of  salts in the body (electrolytes).  Lack of sleep.  Low blood sugar (diabetes).  Low levels of oxygen (for example from chronic lung disorders).  Drug interactions or other medication side effects.  Nutritional deficiencies, especially niacin, thiamine, vitamin C, or vitamin B.  Sudden drop in body temperature (hypothermia).  Illness in the elderly. Constipation can result in confusion. An elderly person who is hospitalized may become confused due to change in daily routine. SYMPTOMS  People often describe their thinking as cloudy or unclear when they are confused. Confusion can also include feeling disoriented. That means you are unaware of where or who you are. You may also not know what the date or time is. If confused, you may also have difficulty paying attention, remembering and making decisions. Some people also act aggressively when they are confused.  DIAGNOSIS  The medical evaluation of confusion may include:  Blood and urine tests.  X-rays.  Brain and nervous system tests.  Analyzing  your brain waves (electroencphalogram or EEG).  A special X-ray (MRI) of your head or other special studies. Your physician will ask questions such as:  Do you get days and nights mixed up?  Are you awake during regular sleep times?  Do you have trouble recognizing people?  Do you know where you are?  Do you know the date and time?  Does the confusion come and go?  Is the confusion quickly getting worse?  Has there been a recent illness?  Has there been a recent head injury?  Are you diabetic?  Do you have a lung disorder?  What medication are you taking?  Have you taken drugs or alcohol? TREATMENT  An admission to the hospital may not be needed, but a confused person should not be left alone. Stay with a family member or friend until the confusion clears. Avoid alcohol, pain relievers or sedative drugs until you have fully recovered. Do not drive until your caregiver  says it is okay. HOME CARE INSTRUCTIONS What family and friends can do:  To find out if someone is confused ask him or her their name, age, and the date. If the person is unsure or answers incorrectly, he or she is confused.  Always introduce yourself, no matter how well the person knows you.  Often remind the person of his or her location.  Place a calendar and clock near the confused person.  Talk about current events and plans for the day.  Try to keep the environment calm, quiet and peaceful.  Make sure the patient keeps follow up appointments with their physician. PREVENTION  Ways to prevent confusion:  Avoid alcohol.  Eat a balanced diet.  Get enough sleep.  Do not become isolated. Spend time with other people and make plans for your days.  Keep careful watch on your blood sugar levels if you are diabetic. SEEK IMMEDIATE MEDICAL CARE IF:   You develop severe headaches, repeated vomiting, seizures, blackouts or slurred speech.  There is increasing confusion, weakness, numbness, restlessness or personality changes.  You develop a loss of balance, have marked dizziness, feel uncoordinated or fall.  You have delusions, hallucinations or develop severe anxiety.  Your family members think you need to be rechecked. Document Released: 11/30/2004 Document Revised: 01/15/2012 Document Reviewed: 07/28/2008 [facility] Patient Information 2014 [place], [place].
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
## 2014-03-11 ENCOUNTER — Encounter: Payer: Self-pay | Admitting: Internal Medicine

## 2014-03-11 ENCOUNTER — Ambulatory Visit (INDEPENDENT_AMBULATORY_CARE_PROVIDER_SITE_OTHER): Payer: Medicare Other | Admitting: Internal Medicine

## 2014-03-11 VITALS — BP 118/60 | HR 78 | Temp 97.6°F | Wt 148.8 lb

## 2014-03-11 DIAGNOSIS — S62101A Fracture of unspecified carpal bone, right wrist, initial encounter for closed fracture: Secondary | ICD-10-CM

## 2014-03-11 DIAGNOSIS — I10 Essential (primary) hypertension: Secondary | ICD-10-CM | POA: Diagnosis not present

## 2014-03-11 DIAGNOSIS — I82409 Acute embolism and thrombosis of unspecified deep veins of unspecified lower extremity: Secondary | ICD-10-CM

## 2014-03-11 DIAGNOSIS — I82401 Acute embolism and thrombosis of unspecified deep veins of right lower extremity: Secondary | ICD-10-CM

## 2014-03-11 DIAGNOSIS — S62109A Fracture of unspecified carpal bone, unspecified wrist, initial encounter for closed fracture: Secondary | ICD-10-CM

## 2014-03-11 DIAGNOSIS — I6529 Occlusion and stenosis of unspecified carotid artery: Secondary | ICD-10-CM | POA: Diagnosis not present

## 2014-03-11 DIAGNOSIS — F0151 Vascular dementia with behavioral disturbance: Secondary | ICD-10-CM | POA: Diagnosis not present

## 2014-03-11 DIAGNOSIS — F0150 Vascular dementia without behavioral disturbance: Secondary | ICD-10-CM | POA: Insufficient documentation

## 2014-03-11 DIAGNOSIS — N183 Chronic kidney disease, stage 3 unspecified: Secondary | ICD-10-CM

## 2014-03-11 DIAGNOSIS — F0153 Vascular dementia, unspecified severity, with mood disturbance: Secondary | ICD-10-CM | POA: Diagnosis not present

## 2014-03-11 DIAGNOSIS — W19XXXA Unspecified fall, initial encounter: Secondary | ICD-10-CM

## 2014-03-11 DIAGNOSIS — K2100 Gastro-esophageal reflux disease with esophagitis, without bleeding: Secondary | ICD-10-CM

## 2014-03-11 DIAGNOSIS — G609 Hereditary and idiopathic neuropathy, unspecified: Secondary | ICD-10-CM

## 2014-03-11 DIAGNOSIS — F329 Major depressive disorder, single episode, unspecified: Principal | ICD-10-CM

## 2014-03-11 DIAGNOSIS — N390 Urinary tract infection, site not specified: Secondary | ICD-10-CM

## 2014-03-11 DIAGNOSIS — F05 Delirium due to known physiological condition: Secondary | ICD-10-CM

## 2014-03-11 DIAGNOSIS — G629 Polyneuropathy, unspecified: Secondary | ICD-10-CM

## 2014-03-11 MED ORDER — SULFAMETHOXAZOLE-TMP DS 800-160 MG PO TABS: 1.0000 | ORAL_TABLET | Freq: Two times a day (BID) | ORAL | Status: DC

## 2014-03-11 MED ORDER — WARFARIN SODIUM 2 MG PO TABS: 2.0000 mg | ORAL_TABLET | Freq: Every day | ORAL | Status: DC

## 2014-03-11 NOTE — Progress Notes (Signed)
Patient ID: Brittany Hughes, female   DOB: [birth year], 81 y.o.   MRN: [id number]    Chief Complaint  Patient presents with  . Medical Management of Chronic Issues    4 month f/u with no recent labs  . Hospitalization Follow-up    hospital and ED visit follow up   Allergies  Allergen Reactions  . Codeine Other (See Comments)    Possibly crazy actions   HPI 80 y/o female patient is here for follow up visit and her sister is present. She was in the hospital from 01/06/14- 01/09/14 with dvt in RLE and right wrist fracture. She was started on coumadin and bridged with lovenox. She was then sent to SNF for STR. She has been weak and declining with her health and memory at home. She was hallucinating yesterday and had a fall and was taken to the ED yesterday. On review of her records i see elevated WBC and dirty u/a She is confused at times of conversaton today As per her home health nurse, pt has not been taking her medication as prescribed. Her sister lives nearby but pt lives by herself. Her meds are placed on pill box and they have been there the next day when staff return  Review of Systems  Constitutional: Negative for fever, chills, weight loss, diaphoresis.  HENT: Negative for congestion, hearing loss and sore throat.   Eyes: Negative for blurred vision, double vision and discharge.  Respiratory: Negative for cough, sputum production, shortness of breath and wheezing.   Cardiovascular: Negative for chest pain, palpitations, orthopnea and leg swelling.  Gastrointestinal: Negative for heartburn, nausea, vomiting, abdominal pain, diarrhea and constipation.  Genitourinary: has increased frequency but denies dysuria Musculoskeletal: Negative for back pain. Has had falls and has a brace in right wrist Skin: Negative for itching and rash. skin tear and bruise present Neurological: Negative for dizziness, tingling, focal weakness and headaches.  Psychiatric/Behavioral: Negative for depression     Past Medical History  Diagnosis Date  . Atrophy of vulva   . Dysuria   . Hyperpotassemia   . Unspecified hereditary and idiopathic peripheral neuropathy   . Osteoporosis, unspecified   . Hypertension   . Vitamin deficiency   . Unspecified malignant neoplasm of skin, site unspecified   . Chronic kidney disease   . Dermatophytosis of nail   . Other diseases of larynx   . Other B-complex deficiencies   . Mild cognitive impairment, so stated   . Other voice and resonance disorders   . Depression   . Hyperlipidemia   . Carotid stenosis   . Unspecified urinary incontinence   . Nephrolithiasis   . Macular degeneration     "left eye; growth behind that" (01/06/2014)  . GERD (gastroesophageal reflux disease)   . Reflux esophagitis   . DVT (deep venous thrombosis) 01/06/2014    RLE  . Seizures     "one, years ago" (01/06/2014)   Current Outpatient Prescriptions on File Prior to Visit  Medication Sig Dispense Refill  . amitriptyline (ELAVIL) 50 MG tablet Take 50 mg by mouth at bedtime. Take one tablet at bedtime      . amLODipine (NORVASC) 10 MG tablet Take 10 mg by mouth daily.      Marland Kitchen antiseptic oral rinse (BIOTENE) LIQD 1 application by Mouth Rinse route every 4 (four) hours as needed for dry mouth.      . calcium carbonate (OS-CAL) 600 MG TABS Take 600 mg by mouth 2 (two) times daily with  a meal.       . dicyclomine (BENTYL) 10 MG capsule Take 10 mg by mouth 4 (four) times daily -  before meals and at bedtime.      . ergocalciferol (VITAMIN D2) 50000 UNITS capsule Take 50,000 Units by mouth every Wednesday.      Marland Kitchen FLUoxetine (PROZAC) 10 MG tablet Take 10 mg by mouth daily.      Marland Kitchen HYDROcodone-acetaminophen (NORCO/VICODIN) 5-325 MG per tablet Take 1-2 tablets by mouth every 6 (six) hours as needed for moderate pain (take 1 tab as needed for moderate pain; take 2 tabs as needed for severe pain).      . LORazepam (ATIVAN) 0.5 MG tablet Take 0.5-1 mg by mouth every 6 (six) hours as needed for  anxiety (takes 0.5mg  as neecded for anxiety, takes 1mg  at bedtime as needed for sleep).      . metoprolol succinate (TOPROL-XL) 25 MG 24 hr tablet Take 25 mg by mouth daily. Take one tablet once a day for blood pressure      . omeprazole (PRILOSEC) 20 MG capsule Take 20 mg by mouth 2 (two) times daily before a meal.       . Polyethyl Glycol-Propyl Glycol (SYSTANE) 0.4-0.3 % SOLN Apply 1 drop to eye daily as needed (for dry eyes).      . simvastatin (ZOCOR) 10 MG tablet Take 10 mg by mouth daily at 6 PM. Take one tablet once a day for cholesterol       No current facility-administered medications on file prior to visit.   Past Surgical History  Procedure Laterality Date  . Stomach surgery  ~ 1960    "had ulcers for years; left scar tissue; did OR" (01/06/2014)  . Excisional hemorrhoidectomy  1970's?  . Carotid endarterectomy Right 2006  . Vaginal hysterectomy  ~ 1980  . Dilation and curettage of uterus    . Cataract extraction w/ intraocular lens  implant, bilateral Bilateral 1990's   Medication reviewed. See [facility]  Physical exam BP 118/60  Pulse 78  Temp(Src) 97.6 F (36.4 C) (Oral)  Wt 148 lb 12.8 oz (67.495 kg)  SpO2 95%  Constitutional: She is oriented to person, place, and time. No distress.  HENT:  Head: Normocephalic and atraumatic.   Mouth/Throat: Oropharynx is clear and moist.  Eyes: Pupils are equal, round, and reactive to light.  Neck: Neck supple.  Cardiovascular: Normal rate, regular rhythm and normal heart sounds.    No murmur heard. Pulmonary/Chest: Effort normal and breath sounds normal. No respiratory distress. She has no wheezes.  Abdominal: Soft. Bowel sounds are normal. There is no tenderness. There is no rebound.  Musculoskeletal: Skin tear noted over right elbow, normal range of motion, no tenderness or deformity noted to the distal right forearm, brace in right wrist and tenderness on deep palpation Neurological: She is alert and oriented to person, place, and  time.  Cranial nerves II through XII intact, 5 out of 5 strength in all 4 extremities, no dysmetria to finger-nose-finger  Skin: Skin is warm and dry.   Psychiatric: She has a normal mood with loss of thought process during conversation  Labs-  CBC    Component Value Date/Time   WBC 10.8* 03/10/2014 1530   WBC 8.3 05/21/2013 0941   RBC 3.76* 03/10/2014 1530   RBC 4.08 05/21/2013 0941   HGB 11.4* 03/10/2014 1530   HCT 33.9* 03/10/2014 1530   PLT 150 03/10/2014 1530   MCV 90.2 03/10/2014 1530   MCH  30.3 03/10/2014 1530   MCH 29.9 05/21/2013 0941   MCHC 33.6 03/10/2014 1530   MCHC 33.0 05/21/2013 0941   RDW 13.8 03/10/2014 1530   RDW 14.7 05/21/2013 0941   LYMPHSABS 1.7 03/10/2014 1530   LYMPHSABS 1.8 05/21/2013 0941   MONOABS 0.7 03/10/2014 1530   EOSABS 0.0 03/10/2014 1530   EOSABS 0.1 05/21/2013 0941   BASOSABS 0.0 03/10/2014 1530   BASOSABS 0.0 05/21/2013 0941    CMP     Component Value Date/Time   NA 142 03/10/2014 1530   NA 142 11/12/2013 1201   K 4.0 03/10/2014 1530   CL 103 03/10/2014 1530   CO2 25 03/10/2014 1530   GLUCOSE 108* 03/10/2014 1530   GLUCOSE 68 11/12/2013 1201   BUN 20 03/10/2014 1530   BUN 15 11/12/2013 1201   CREATININE 1.12* 03/10/2014 1530   CALCIUM 9.5 03/10/2014 1530   PROT 6.4 05/21/2013 0941   AST 18 05/21/2013 0941   ALT 8 05/21/2013 0941   ALKPHOS 53 05/21/2013 0941   BILITOT 0.1 05/21/2013 0941   GFRNONAA 44* 03/10/2014 1530   GFRAA 51* 03/10/2014 1530   Lab Results  Component Value Date   INR 1.53* 03/10/2014   INR 1.64* 01/09/2014   INR 0.92 01/08/2014    Assessment/plan  1. Right leg DVT On coumadin but not taking it as prescribed. Will increase coumadin to 2 mg daily for now. Will need inr rechecked in a week. Goal inr 2-3  2. Essential hypertension, benign Continue amlodipine 10 mg daily for now with toprol xl 25 mg daily  3. Reflux esophagitis Continue prilosec 20 mg daily  4. Peripheral neuropathy Continue norco prn for pain  5. Right wrist fracture Prn norco, ca-vit d and  PT exercises for now  6. Chronic kidney disease, stage III (moderate) Avoid nsaids, monitor bp readings  7. Urinary tract infection, site not specified Bactrim ds 1 tab bid for 10 days, hydration encouraged  8. Dementia, vascular, with depression continue bp meds with statin  9. Acute confusional state Possible from uti along with her dementia. Treat infection and reassess. With her progresive dementia, reviewed care plan with pt and sister in detail. SNF for long term care is the plan of care. Family to decide on nursing home, list with contact info provided. Will then fill out FL2 form  10. Fall To use her walker and take fall precautions continue therapy for gait training

## 2014-03-12 ENCOUNTER — Other Ambulatory Visit: Payer: Self-pay | Admitting: *Deleted

## 2014-03-12 DIAGNOSIS — I129 Hypertensive chronic kidney disease with stage 1 through stage 4 chronic kidney disease, or unspecified chronic kidney disease: Secondary | ICD-10-CM | POA: Diagnosis not present

## 2014-03-12 DIAGNOSIS — N189 Chronic kidney disease, unspecified: Secondary | ICD-10-CM | POA: Diagnosis not present

## 2014-03-12 DIAGNOSIS — G609 Hereditary and idiopathic neuropathy, unspecified: Secondary | ICD-10-CM | POA: Diagnosis not present

## 2014-03-12 DIAGNOSIS — I82409 Acute embolism and thrombosis of unspecified deep veins of unspecified lower extremity: Secondary | ICD-10-CM | POA: Diagnosis not present

## 2014-03-12 DIAGNOSIS — I2510 Atherosclerotic heart disease of native coronary artery without angina pectoris: Secondary | ICD-10-CM | POA: Diagnosis not present

## 2014-03-12 DIAGNOSIS — IMO0001 Reserved for inherently not codable concepts without codable children: Secondary | ICD-10-CM | POA: Diagnosis not present

## 2014-03-12 MED ORDER — ERGOCALCIFEROL 1.25 MG (50000 UT) PO CAPS: 50000.0000 [IU] | ORAL_CAPSULE | ORAL | Status: DC

## 2014-03-12 MED ORDER — LORAZEPAM 0.5 MG PO TABS: 0.5000 mg | ORAL_TABLET | Freq: Four times a day (QID) | ORAL | Status: DC | PRN

## 2014-03-12 MED ORDER — DICYCLOMINE HCL 10 MG PO CAPS: 10.0000 mg | ORAL_CAPSULE | Freq: Three times a day (TID) | ORAL | Status: DC

## 2014-03-12 MED ORDER — FLUOXETINE HCL 10 MG PO TABS: 10.0000 mg | ORAL_TABLET | Freq: Every day | ORAL | Status: DC

## 2014-03-13 DIAGNOSIS — I82409 Acute embolism and thrombosis of unspecified deep veins of unspecified lower extremity: Secondary | ICD-10-CM | POA: Diagnosis not present

## 2014-03-13 DIAGNOSIS — I2510 Atherosclerotic heart disease of native coronary artery without angina pectoris: Secondary | ICD-10-CM | POA: Diagnosis not present

## 2014-03-13 DIAGNOSIS — G609 Hereditary and idiopathic neuropathy, unspecified: Secondary | ICD-10-CM | POA: Diagnosis not present

## 2014-03-13 DIAGNOSIS — IMO0001 Reserved for inherently not codable concepts without codable children: Secondary | ICD-10-CM | POA: Diagnosis not present

## 2014-03-13 DIAGNOSIS — I129 Hypertensive chronic kidney disease with stage 1 through stage 4 chronic kidney disease, or unspecified chronic kidney disease: Secondary | ICD-10-CM | POA: Diagnosis not present

## 2014-03-13 DIAGNOSIS — N189 Chronic kidney disease, unspecified: Secondary | ICD-10-CM | POA: Diagnosis not present

## 2014-03-16 ENCOUNTER — Telehealth: Payer: Self-pay | Admitting: *Deleted

## 2014-03-16 DIAGNOSIS — G609 Hereditary and idiopathic neuropathy, unspecified: Secondary | ICD-10-CM | POA: Diagnosis not present

## 2014-03-16 DIAGNOSIS — N189 Chronic kidney disease, unspecified: Secondary | ICD-10-CM | POA: Diagnosis not present

## 2014-03-16 DIAGNOSIS — IMO0001 Reserved for inherently not codable concepts without codable children: Secondary | ICD-10-CM | POA: Diagnosis not present

## 2014-03-16 DIAGNOSIS — Z7901 Long term (current) use of anticoagulants: Secondary | ICD-10-CM | POA: Diagnosis not present

## 2014-03-16 DIAGNOSIS — I129 Hypertensive chronic kidney disease with stage 1 through stage 4 chronic kidney disease, or unspecified chronic kidney disease: Secondary | ICD-10-CM | POA: Diagnosis not present

## 2014-03-16 DIAGNOSIS — I82409 Acute embolism and thrombosis of unspecified deep veins of unspecified lower extremity: Secondary | ICD-10-CM | POA: Diagnosis not present

## 2014-03-16 DIAGNOSIS — I2510 Atherosclerotic heart disease of native coronary artery without angina pectoris: Secondary | ICD-10-CM | POA: Diagnosis not present

## 2014-03-16 LAB — PROTIME-INR

## 2014-03-16 NOTE — Telephone Encounter (Signed)
Dr. Bubba Camp called and stated that she received a call from Leland from [facility] regarding Protime Results 03/16/2014  PT:  26.9 INR:  2.57  Current Dose of Coumadin is 2 mg once daily Per Dr Bubba Camp ---Patient to continue same dose of Coumadin and recheck in One Month.  Alyssa with [facility] Notified and she will notify patient's sister.

## 2014-03-17 ENCOUNTER — Telehealth: Payer: Self-pay | Admitting: *Deleted

## 2014-03-17 DIAGNOSIS — S5290XD Unspecified fracture of unspecified forearm, subsequent encounter for closed fracture with routine healing: Secondary | ICD-10-CM | POA: Diagnosis not present

## 2014-03-17 NOTE — Telephone Encounter (Signed)
Ok to fill out FL2 Form, I will review and sign it in am

## 2014-03-17 NOTE — Telephone Encounter (Signed)
They need an FL2 Form filled out, said you already know about this from their previous appointment. Would like to pick it up today. It needs to have all the issues checked and filled out so Medicaid will not any questions.Please Advise

## 2014-03-18 NOTE — Telephone Encounter (Signed)
Patient daughter Notified.

## 2014-03-20 DIAGNOSIS — I129 Hypertensive chronic kidney disease with stage 1 through stage 4 chronic kidney disease, or unspecified chronic kidney disease: Secondary | ICD-10-CM | POA: Diagnosis not present

## 2014-03-20 DIAGNOSIS — I82409 Acute embolism and thrombosis of unspecified deep veins of unspecified lower extremity: Secondary | ICD-10-CM | POA: Diagnosis not present

## 2014-03-20 DIAGNOSIS — IMO0001 Reserved for inherently not codable concepts without codable children: Secondary | ICD-10-CM | POA: Diagnosis not present

## 2014-03-20 DIAGNOSIS — I2510 Atherosclerotic heart disease of native coronary artery without angina pectoris: Secondary | ICD-10-CM | POA: Diagnosis not present

## 2014-03-20 DIAGNOSIS — G609 Hereditary and idiopathic neuropathy, unspecified: Secondary | ICD-10-CM | POA: Diagnosis not present

## 2014-03-20 DIAGNOSIS — N189 Chronic kidney disease, unspecified: Secondary | ICD-10-CM | POA: Diagnosis not present

## 2014-03-23 DIAGNOSIS — N189 Chronic kidney disease, unspecified: Secondary | ICD-10-CM | POA: Diagnosis not present

## 2014-03-23 DIAGNOSIS — I129 Hypertensive chronic kidney disease with stage 1 through stage 4 chronic kidney disease, or unspecified chronic kidney disease: Secondary | ICD-10-CM | POA: Diagnosis not present

## 2014-03-23 DIAGNOSIS — I82409 Acute embolism and thrombosis of unspecified deep veins of unspecified lower extremity: Secondary | ICD-10-CM | POA: Diagnosis not present

## 2014-03-23 DIAGNOSIS — IMO0001 Reserved for inherently not codable concepts without codable children: Secondary | ICD-10-CM | POA: Diagnosis not present

## 2014-03-23 DIAGNOSIS — I2510 Atherosclerotic heart disease of native coronary artery without angina pectoris: Secondary | ICD-10-CM | POA: Diagnosis not present

## 2014-03-23 DIAGNOSIS — G609 Hereditary and idiopathic neuropathy, unspecified: Secondary | ICD-10-CM | POA: Diagnosis not present

## 2014-03-25 ENCOUNTER — Encounter: Payer: Self-pay | Admitting: Internal Medicine

## 2014-04-02 ENCOUNTER — Encounter: Payer: Self-pay | Admitting: Internal Medicine

## 2014-04-07 ENCOUNTER — Encounter: Payer: Self-pay | Admitting: Internal Medicine

## 2014-04-07 ENCOUNTER — Other Ambulatory Visit (HOSPITAL_COMMUNITY): Payer: Self-pay | Admitting: Internal Medicine

## 2014-04-07 ENCOUNTER — Ambulatory Visit (HOSPITAL_COMMUNITY)
Admission: RE | Admit: 2014-04-07 | Discharge: 2014-04-07 | Disposition: A | Discharge status: Home / Self Care | Payer: Medicare Other | Source: Ambulatory Visit | Attending: Internal Medicine | Admitting: Internal Medicine

## 2014-04-07 ENCOUNTER — Ambulatory Visit (INDEPENDENT_AMBULATORY_CARE_PROVIDER_SITE_OTHER): Payer: Medicare Other | Admitting: Internal Medicine

## 2014-04-07 VITALS — BP 118/72 | HR 66 | Temp 98.6°F | Resp 12 | Wt 149.0 lb

## 2014-04-07 DIAGNOSIS — Z86718 Personal history of other venous thrombosis and embolism: Secondary | ICD-10-CM | POA: Diagnosis not present

## 2014-04-07 DIAGNOSIS — I82409 Acute embolism and thrombosis of unspecified deep veins of unspecified lower extremity: Secondary | ICD-10-CM | POA: Diagnosis not present

## 2014-04-07 DIAGNOSIS — E785 Hyperlipidemia, unspecified: Secondary | ICD-10-CM | POA: Diagnosis not present

## 2014-04-07 DIAGNOSIS — I6529 Occlusion and stenosis of unspecified carotid artery: Secondary | ICD-10-CM

## 2014-04-07 DIAGNOSIS — G629 Polyneuropathy, unspecified: Secondary | ICD-10-CM

## 2014-04-07 DIAGNOSIS — R600 Localized edema: Secondary | ICD-10-CM | POA: Insufficient documentation

## 2014-04-07 DIAGNOSIS — F3289 Other specified depressive episodes: Secondary | ICD-10-CM | POA: Diagnosis not present

## 2014-04-07 DIAGNOSIS — I10 Essential (primary) hypertension: Secondary | ICD-10-CM | POA: Diagnosis not present

## 2014-04-07 DIAGNOSIS — K219 Gastro-esophageal reflux disease without esophagitis: Secondary | ICD-10-CM

## 2014-04-07 DIAGNOSIS — M7989 Other specified soft tissue disorders: Secondary | ICD-10-CM

## 2014-04-07 DIAGNOSIS — G609 Hereditary and idiopathic neuropathy, unspecified: Secondary | ICD-10-CM | POA: Diagnosis not present

## 2014-04-07 DIAGNOSIS — R609 Edema, unspecified: Secondary | ICD-10-CM | POA: Diagnosis not present

## 2014-04-07 DIAGNOSIS — I6990 Unspecified sequelae of unspecified cerebrovascular disease: Secondary | ICD-10-CM | POA: Diagnosis not present

## 2014-04-07 DIAGNOSIS — N183 Chronic kidney disease, stage 3 unspecified: Secondary | ICD-10-CM | POA: Diagnosis not present

## 2014-04-07 DIAGNOSIS — F0150 Vascular dementia without behavioral disturbance: Secondary | ICD-10-CM

## 2014-04-07 DIAGNOSIS — F329 Major depressive disorder, single episode, unspecified: Secondary | ICD-10-CM | POA: Diagnosis not present

## 2014-04-07 DIAGNOSIS — E559 Vitamin D deficiency, unspecified: Secondary | ICD-10-CM

## 2014-04-07 DIAGNOSIS — F0153 Vascular dementia, unspecified severity, with mood disturbance: Secondary | ICD-10-CM

## 2014-04-07 DIAGNOSIS — F0151 Vascular dementia with behavioral disturbance: Secondary | ICD-10-CM | POA: Diagnosis not present

## 2014-04-07 DIAGNOSIS — Z7901 Long term (current) use of anticoagulants: Secondary | ICD-10-CM | POA: Insufficient documentation

## 2014-04-07 LAB — POCT INR: INR: 2.9

## 2014-04-07 MED ORDER — OMEPRAZOLE 20 MG PO CPDR: 20.0000 mg | DELAYED_RELEASE_CAPSULE | Freq: Every day | ORAL | Status: DC

## 2014-04-07 NOTE — Progress Notes (Signed)
Patient ID: Brittany Hughes, female   DOB: December 15, 1927, 80 y.o.   MRN: [id number]    Chief Complaint  Patient presents with  . Acute Visit    left leg swelling, inr check, med adjustment    Allergies  Allergen Reactions  . Codeine Other (See Comments)    Possibly crazy actions   HPI 80 y/o female patient is here for visit with her niece who will be her main health care provider. She is on coumadin and has not had her inr checked for few weeks. She has noticed new swelling in her left leg. She continues to live by herself and due to financial reasons it is getting difficult to place her in SNF. She is mostly compliant with her medications but mixes and misses some dosing.   Review of Systems  Constitutional: Negative for fever, chills, weight loss, diaphoresis.  HENT: Negative for congestion, hearing loss and sore throat.   Eyes: Negative for blurred vision, double vision and discharge.  Respiratory: Negative for cough, sputum production, shortness of breath and wheezing.   Cardiovascular: Negative for chest pain, palpitations, orthopnea  Gastrointestinal: Negative for heartburn, nausea, vomiting, abdominal pain. Has IBS Genitourinary: has increased frequency but denies dysuria Musculoskeletal: Negative for back pain.  Skin: Negative for itching and rash.  Neurological: Negative for dizziness, tingling, focal weakness and headaches.  Psychiatric/Behavioral: Negative for depression   Past Medical History  Diagnosis Date  . Atrophy of vulva   . Dysuria   . Hyperpotassemia   . Unspecified hereditary and idiopathic peripheral neuropathy   . Osteoporosis, unspecified   . Hypertension   . Vitamin deficiency   . Unspecified malignant neoplasm of skin, site unspecified   . Chronic kidney disease   . Dermatophytosis of nail   . Other diseases of larynx   . Other B-complex deficiencies   . Mild cognitive impairment, so stated   . Other voice and resonance disorders   . Depression   .  Hyperlipidemia   . Carotid stenosis   . Unspecified urinary incontinence   . Nephrolithiasis   . Macular degeneration     "left eye; growth behind that" (01/06/2014)  . GERD (gastroesophageal reflux disease)   . Reflux esophagitis   . DVT (deep venous thrombosis) 01/06/2014    RLE  . Seizures     "one, years ago" (01/06/2014)   Medication reviewed. See [facility]  Physical exam BP 118/72  Pulse 66  Temp(Src) 98.6 F (37 C) (Oral)  Resp 12  Wt 149 lb (67.586 kg)  SpO2 97%  Constitutional: She is oriented to person, place, and time. No distress.  HENT:  Head: Normocephalic and atraumatic.   Mouth/Throat: Oropharynx is clear and moist.   Eyes: Pupils are equal, round, and reactive to light.   Neck: Neck supple.   Cardiovascular: Normal rate, regular rhythm and normal heart sounds.    No murmur heard. Pulmonary/Chest: Effort normal and breath sounds normal. No respiratory distress. She has no wheezes.   Abdominal: Soft. Bowel sounds are normal. There is no tenderness. There is no rebound.  Musculoskeletal: able to move all 4 extremities, no assistive device today, left leg 2+ edema and normal right leg Skin: Skin is warm and dry.   Psychiatric: She has a normal mood   Labs- Lipid Panel     Component Value Date/Time   TRIG 179* 05/21/2013 0941   HDL 60 05/21/2013 0941   CHOLHDL 2.8 05/21/2013 0941   LDLCALC 69 05/21/2013 0941  Assessment/plan 1. DVT (deep venous thrombosis) inr 2.9 today, continue coumadin at current dose and check inr in 1 month - POC INR  2. Leg edema, left Rule out DVT vs popliteal cyst and baker cyst. No signs of cellulitis or thrombophlebitis - US Venous Img Lower Unilateral Left; Future  3. Dementia, vascular, with depression Stable, continue bp medications. Niece would like resources on SNF, data provided  4. Peripheral neuropathy Continue elavil  5. Vitamin D deficiency Continue ca-vit  D, stop weekly vit d supplement  6. Other and unspecified  hyperlipidemia Check lipid panel today and if ldl > 100, continue statin else stop it - Lipid Panel  7. GERD (gastroesophageal reflux disease) Continue prilosec, helping her  8. Essential hypertension, benign Continue norvasc with toprol xl for now

## 2014-04-07 NOTE — Progress Notes (Signed)
VASCULAR LAB PRELIMINARY  PRELIMINARY  PRELIMINARY  PRELIMINARY  Left lower extremity venous duplex completed.    Preliminary report:  Left:  No evidence of DVT, superficial thrombosis, or Baker's cyst.  Nani Ravens, RVT 04/07/2014, 5:32 PM

## 2014-04-08 ENCOUNTER — Ambulatory Visit: Payer: Medicare Other | Admitting: Internal Medicine

## 2014-04-08 ENCOUNTER — Other Ambulatory Visit: Payer: Self-pay

## 2014-04-08 LAB — LIPID PANEL
Chol/HDL Ratio: 2.9 ratio units (ref 0.0–4.4)
Cholesterol, Total: 167 mg/dL (ref 100–199)
HDL: 57 mg/dL (ref >39)
LDL CALC: 71 mg/dL (ref 0–99)
TRIGLYCERIDES: 196 mg/dL — AB (ref 0–149)
VLDL Cholesterol Cal: 39 mg/dL (ref 5–40)

## 2014-04-08 MED ORDER — SIMVASTATIN 10 MG PO TABS: ORAL_TABLET | ORAL | Status: DC

## 2014-04-15 ENCOUNTER — Ambulatory Visit: Payer: Medicare Other | Admitting: Internal Medicine

## 2014-05-04 ENCOUNTER — Encounter: Payer: Self-pay | Admitting: Pharmacotherapy

## 2014-05-04 ENCOUNTER — Ambulatory Visit (INDEPENDENT_AMBULATORY_CARE_PROVIDER_SITE_OTHER): Payer: Medicare Other | Admitting: Pharmacotherapy

## 2014-05-04 VITALS — BP 138/60 | HR 59 | Temp 98.3°F | Resp 16 | Ht 61.0 in | Wt 150.8 lb

## 2014-05-04 DIAGNOSIS — I6529 Occlusion and stenosis of unspecified carotid artery: Secondary | ICD-10-CM

## 2014-05-04 DIAGNOSIS — I82409 Acute embolism and thrombosis of unspecified deep veins of unspecified lower extremity: Secondary | ICD-10-CM | POA: Diagnosis not present

## 2014-05-04 DIAGNOSIS — I82402 Acute embolism and thrombosis of unspecified deep veins of left lower extremity: Secondary | ICD-10-CM

## 2014-05-04 DIAGNOSIS — Z7901 Long term (current) use of anticoagulants: Secondary | ICD-10-CM | POA: Diagnosis not present

## 2014-05-04 DIAGNOSIS — I82401 Acute embolism and thrombosis of unspecified deep veins of right lower extremity: Secondary | ICD-10-CM

## 2014-05-04 LAB — POCT INR: INR: 6

## 2014-05-04 MED ORDER — WARFARIN SODIUM 2 MG PO TABS: 1.0000 mg | ORAL_TABLET | Freq: Every day | ORAL | Status: DC

## 2014-05-04 NOTE — Progress Notes (Signed)
Subjective:     Patient ID: Brittany Hughes, female   DOB: Sep 11, 1928, 80 y.o.   MRN: [id number]  HPI Last INR was OK at 2.9 Her current Coumadin dose is 2mg  daily.  She used to only take 1mg  daily. Denies missed doses. Denies any unusual bleeding or bruising. Denies CP, SOB, falls Consistent with vitamin K intake.  Denies any cranberry, blueberry, etc.   Review of Systems  HENT: Negative for nosebleeds.   Respiratory: Negative for shortness of breath.   Cardiovascular: Negative for chest pain.  Gastrointestinal: Negative for blood in stool and anal bleeding.  Genitourinary: Negative for hematuria.  Hematological: Bruises/bleeds easily.       Objective:   Physical Exam  Constitutional: She is oriented to person, place, and time. She appears well-developed and well-nourished.  HENT:  Right Ear: External ear normal.  Left Ear: External ear normal.  Cardiovascular: Normal rate, regular rhythm and normal heart sounds.   Pulmonary/Chest: Effort normal and breath sounds normal.  Neurological: She is alert and oriented to person, place, and time.  Skin: Skin is warm and dry.  Psychiatric: She has a normal mood and affect. Her behavior is normal.    BP:  138/60  HR:  59  Wt:  150lb     Assessment:     INR 6.0     Plan:     1.  INR above goal 2-3 without bleeding. 2.  Hold Coumadin x 3 days.  Increase vitamin K intake. 3.  Then start Coumadin 1mg  daily 4.  RTC in 1 week.

## 2014-05-04 NOTE — Patient Instructions (Signed)
INR 6.0 (goal 2-3) No Coumadin for 3 days Then start Coumadin 1mg  (1/2 tablet) daily

## 2014-05-06 DEATH — deceased

## 2014-05-11 ENCOUNTER — Encounter: Payer: Self-pay | Admitting: Pharmacotherapy

## 2014-05-11 ENCOUNTER — Ambulatory Visit (INDEPENDENT_AMBULATORY_CARE_PROVIDER_SITE_OTHER): Payer: Medicare Other | Admitting: Pharmacotherapy

## 2014-05-11 VITALS — BP 136/70 | HR 65 | Wt 153.0 lb

## 2014-05-11 DIAGNOSIS — I82401 Acute embolism and thrombosis of unspecified deep veins of right lower extremity: Secondary | ICD-10-CM

## 2014-05-11 DIAGNOSIS — Z7901 Long term (current) use of anticoagulants: Secondary | ICD-10-CM

## 2014-05-11 DIAGNOSIS — I6529 Occlusion and stenosis of unspecified carotid artery: Secondary | ICD-10-CM | POA: Diagnosis not present

## 2014-05-11 DIAGNOSIS — I82409 Acute embolism and thrombosis of unspecified deep veins of unspecified lower extremity: Secondary | ICD-10-CM | POA: Diagnosis not present

## 2014-05-11 LAB — POCT INR: INR: 2

## 2014-05-11 NOTE — Progress Notes (Signed)
Subjective:     Patient ID: Brittany Hughes, female   DOB: 06-27-1928, 80 y.o.   MRN: [id number]  HPI Last INR was high at 6.0. Coumadin was held, then started at 1mg  QD. Denies missed doses. Denies unusual bleeding or bruising. Denies CP, SOB, falls Consistent with vitamin K intake.   Review of Systems  HENT: Negative for nosebleeds.   Respiratory: Negative for shortness of breath.   Cardiovascular: Positive for leg swelling. Negative for chest pain.  Gastrointestinal: Negative for blood in stool and anal bleeding.  Genitourinary: Negative for hematuria.  Hematological: Bruises/bleeds easily.       Objective:   Physical Exam  Constitutional: She is oriented to person, place, and time. She appears well-developed and well-nourished.  HENT:  Right Ear: External ear normal.  Left Ear: External ear normal.  Cardiovascular: Normal rate, regular rhythm and normal heart sounds.   Pulmonary/Chest: Effort normal and breath sounds normal.  Neurological: She is alert and oriented to person, place, and time.  Skin: Skin is warm and dry.  Psychiatric: She has a normal mood and affect. Her behavior is normal.    BP;  136/70,  HR:  65   Wt:  153lb     Assessment:     INR 2.0     Plan:     1.  INR at goal 2-3 2.  Continue Coumadin 1mg  QD 3.  RTC in 1 month

## 2014-05-11 NOTE — Patient Instructions (Signed)
INR 2.0 (goal 2-3) Continue taking Coumadin 1mg  (1/2 tablet) once daily

## 2014-05-21 ENCOUNTER — Other Ambulatory Visit: Payer: Self-pay | Admitting: Internal Medicine

## 2014-06-01 ENCOUNTER — Other Ambulatory Visit: Payer: Self-pay | Admitting: Internal Medicine

## 2014-06-15 ENCOUNTER — Ambulatory Visit: Payer: Medicare Other | Admitting: Pharmacotherapy

## 2014-06-22 ENCOUNTER — Ambulatory Visit: Payer: Medicare Other | Admitting: Pharmacotherapy

## 2014-06-24 ENCOUNTER — Other Ambulatory Visit: Payer: Self-pay | Admitting: Internal Medicine

## 2014-06-26 ENCOUNTER — Other Ambulatory Visit: Payer: Self-pay | Admitting: Internal Medicine

## 2014-06-26 DIAGNOSIS — I82401 Acute embolism and thrombosis of unspecified deep veins of right lower extremity: Secondary | ICD-10-CM

## 2014-06-26 MED ORDER — PENTAZOCINE-NALOXONE HCL 50-0.5 MG PO TABS: 1.0000 | ORAL_TABLET | Freq: Three times a day (TID) | ORAL | Status: DC | PRN

## 2014-06-28 ENCOUNTER — Other Ambulatory Visit: Payer: Self-pay | Admitting: Internal Medicine

## 2014-06-29 ENCOUNTER — Ambulatory Visit (INDEPENDENT_AMBULATORY_CARE_PROVIDER_SITE_OTHER): Payer: Medicare Other | Admitting: Pharmacotherapy

## 2014-06-29 ENCOUNTER — Encounter: Payer: Self-pay | Admitting: Pharmacotherapy

## 2014-06-29 VITALS — BP 142/68 | HR 68 | Temp 97.8°F | Wt 155.4 lb

## 2014-06-29 DIAGNOSIS — N183 Chronic kidney disease, stage 3 unspecified: Secondary | ICD-10-CM | POA: Diagnosis not present

## 2014-06-29 DIAGNOSIS — Z7901 Long term (current) use of anticoagulants: Secondary | ICD-10-CM | POA: Diagnosis not present

## 2014-06-29 DIAGNOSIS — I82409 Acute embolism and thrombosis of unspecified deep veins of unspecified lower extremity: Secondary | ICD-10-CM | POA: Diagnosis not present

## 2014-06-29 DIAGNOSIS — N039 Chronic nephritic syndrome with unspecified morphologic changes: Secondary | ICD-10-CM | POA: Diagnosis not present

## 2014-06-29 DIAGNOSIS — D631 Anemia in chronic kidney disease: Secondary | ICD-10-CM | POA: Diagnosis not present

## 2014-06-29 DIAGNOSIS — I6529 Occlusion and stenosis of unspecified carotid artery: Secondary | ICD-10-CM

## 2014-06-29 DIAGNOSIS — N2581 Secondary hyperparathyroidism of renal origin: Secondary | ICD-10-CM | POA: Diagnosis not present

## 2014-06-29 DIAGNOSIS — I82401 Acute embolism and thrombosis of unspecified deep veins of right lower extremity: Secondary | ICD-10-CM

## 2014-06-29 LAB — POCT INR: INR: 2.3

## 2014-06-29 NOTE — Progress Notes (Signed)
   Subjective:    Patient ID: Brittany Hughes, female    DOB: 09-Jun-1928, 80 y.o.   MRN: [id number]  HPI Her last INR was OK at 2.0.  Current Coumadin dose is 1mg  QD. Denies missed doses. Denies unusual bleeding or bruising  Denies CP, falls. Has chronic SOB - unchanged. Consistent with vitamin K intake.  She thinks she is at 6 months of anticoagulation.    Review of Systems  HENT: Negative for nosebleeds.   Respiratory: Positive for shortness of breath.   Cardiovascular: Negative for chest pain and leg swelling.  Gastrointestinal: Negative for blood in stool and anal bleeding.  Genitourinary: Negative for hematuria.  Hematological: Bruises/bleeds easily.       Objective:   Physical Exam  Vitals reviewed. Constitutional: She is oriented to person, place, and time. She appears well-developed and well-nourished.  HENT:  Right Ear: External ear normal.  Left Ear: External ear normal.  Cardiovascular: Normal rate, regular rhythm and normal heart sounds.   Pulmonary/Chest: Effort normal and breath sounds normal.  Neurological: She is alert and oriented to person, place, and time.  Skin: Skin is warm and dry.  Psychiatric: She has a normal mood and affect. Her behavior is normal.    BP:  142/68   HR:  68  Wt:  155lb  INR:  2.3        Assessment & Plan:  1.  INR at goal 2-3. 2.  Continue Coumadin 1mg  QD. 3.  RTC in 1 month.  Will follow up to see if she can stop warfarin.

## 2014-06-29 NOTE — Patient Instructions (Signed)
INR 2.3 Continue Coumadin 1mg  daily

## 2014-07-06 ENCOUNTER — Ambulatory Visit: Payer: Medicare Other | Admitting: Pharmacotherapy

## 2014-07-23 ENCOUNTER — Other Ambulatory Visit: Payer: Self-pay | Admitting: Nurse Practitioner

## 2014-07-23 ENCOUNTER — Other Ambulatory Visit: Payer: Self-pay | Admitting: *Deleted

## 2014-07-27 ENCOUNTER — Ambulatory Visit (INDEPENDENT_AMBULATORY_CARE_PROVIDER_SITE_OTHER): Payer: Medicare Other | Admitting: Pharmacotherapy

## 2014-07-27 ENCOUNTER — Encounter: Payer: Self-pay | Admitting: Pharmacotherapy

## 2014-07-27 VITALS — BP 136/62 | HR 66 | Temp 99.0°F | Resp 20 | Ht 61.0 in | Wt 155.2 lb

## 2014-07-27 DIAGNOSIS — I82401 Acute embolism and thrombosis of unspecified deep veins of right lower extremity: Secondary | ICD-10-CM

## 2014-07-27 DIAGNOSIS — Z7901 Long term (current) use of anticoagulants: Secondary | ICD-10-CM

## 2014-07-27 DIAGNOSIS — I6529 Occlusion and stenosis of unspecified carotid artery: Secondary | ICD-10-CM

## 2014-07-27 DIAGNOSIS — I82409 Acute embolism and thrombosis of unspecified deep veins of unspecified lower extremity: Secondary | ICD-10-CM

## 2014-07-27 LAB — POCT INR: INR: 1.9

## 2014-07-27 NOTE — Patient Instructions (Signed)
OK to stop warfarin

## 2014-07-27 NOTE — Progress Notes (Signed)
   Subjective:    Patient ID: Brittany Hughes, female    DOB: May 07, 1928, 80 y.o.   MRN: [id number]  HPI Last INR was OK at 2.4 Current Coumadin 1mg  QD. Denies missed doses. Denies unusual bleeding or bruising. Denies CP, SOB, falls Consistent with vitamin K.  Has been on warfarin 6 months without problems. Her venous Doppler in June indicated that DVT had resolved.    Review of Systems  HENT: Negative for nosebleeds.   Respiratory: Negative for shortness of breath.   Cardiovascular: Negative for chest pain and leg swelling.  Gastrointestinal: Negative for blood in stool and anal bleeding.  Genitourinary: Negative for hematuria.  Hematological: Bruises/bleeds easily.       Objective:   Physical Exam  Vitals reviewed. Constitutional: She is oriented to person, place, and time. She appears well-developed and well-nourished.  HENT:  Right Ear: External ear normal.  Left Ear: External ear normal.  Cardiovascular: Normal rate, regular rhythm and normal heart sounds.   Pulmonary/Chest: Effort normal and breath sounds normal.  Neurological: She is alert and oriented to person, place, and time.  Skin: Skin is warm and dry.  Psychiatric: She has a normal mood and affect. Her behavior is normal.    INR 1.9  BP:  136/62  HR:  66  Wt:  155lb        Assessment & Plan:  1.  Has been on anticoagulation x 6 months.  Discussed with Dr. Mariea Clonts. 2.  OK to stop warfarin at this time.

## 2014-08-12 ENCOUNTER — Ambulatory Visit: Payer: Medicare Other | Admitting: Internal Medicine

## 2014-08-19 ENCOUNTER — Other Ambulatory Visit: Payer: Self-pay | Admitting: Internal Medicine

## 2014-08-24 ENCOUNTER — Other Ambulatory Visit: Payer: Self-pay | Admitting: *Deleted

## 2014-08-24 DIAGNOSIS — N189 Chronic kidney disease, unspecified: Secondary | ICD-10-CM | POA: Diagnosis not present

## 2014-08-24 DIAGNOSIS — I129 Hypertensive chronic kidney disease with stage 1 through stage 4 chronic kidney disease, or unspecified chronic kidney disease: Secondary | ICD-10-CM | POA: Diagnosis not present

## 2014-08-24 DIAGNOSIS — N183 Chronic kidney disease, stage 3 (moderate): Secondary | ICD-10-CM | POA: Diagnosis not present

## 2014-08-24 DIAGNOSIS — R300 Dysuria: Secondary | ICD-10-CM | POA: Diagnosis not present

## 2014-08-24 DIAGNOSIS — N2581 Secondary hyperparathyroidism of renal origin: Secondary | ICD-10-CM | POA: Diagnosis not present

## 2014-08-24 DIAGNOSIS — D631 Anemia in chronic kidney disease: Secondary | ICD-10-CM | POA: Diagnosis not present

## 2014-08-24 MED ORDER — OMEPRAZOLE 20 MG PO CPDR: 20.0000 mg | DELAYED_RELEASE_CAPSULE | Freq: Every day | ORAL | Status: DC

## 2014-08-24 NOTE — Telephone Encounter (Signed)
[facility] 

## 2014-08-26 ENCOUNTER — Ambulatory Visit: Payer: Medicare Other | Admitting: Internal Medicine

## 2014-08-28 ENCOUNTER — Other Ambulatory Visit: Payer: Self-pay | Admitting: Internal Medicine

## 2014-08-31 ENCOUNTER — Other Ambulatory Visit: Payer: Self-pay | Admitting: Internal Medicine

## 2014-09-01 ENCOUNTER — Other Ambulatory Visit: Payer: Self-pay | Admitting: Internal Medicine

## 2014-09-01 ENCOUNTER — Other Ambulatory Visit: Payer: Self-pay | Admitting: Nurse Practitioner

## 2014-09-01 NOTE — Telephone Encounter (Signed)
DUPLICATE

## 2014-09-09 ENCOUNTER — Ambulatory Visit: Payer: Medicare Other | Admitting: Internal Medicine

## 2014-09-16 ENCOUNTER — Other Ambulatory Visit: Payer: Self-pay | Admitting: Internal Medicine

## 2014-09-28 ENCOUNTER — Other Ambulatory Visit: Payer: Self-pay | Admitting: Internal Medicine

## 2014-09-29 ENCOUNTER — Encounter: Payer: Self-pay | Admitting: Internal Medicine

## 2014-09-29 ENCOUNTER — Ambulatory Visit (INDEPENDENT_AMBULATORY_CARE_PROVIDER_SITE_OTHER): Payer: Medicare Other | Admitting: Internal Medicine

## 2014-09-29 ENCOUNTER — Ambulatory Visit (INDEPENDENT_AMBULATORY_CARE_PROVIDER_SITE_OTHER): Payer: Medicare Other | Admitting: *Deleted

## 2014-09-29 VITALS — BP 130/68 | HR 59 | Temp 97.7°F | Resp 18 | Ht 61.0 in | Wt 158.0 lb

## 2014-09-29 DIAGNOSIS — Z23 Encounter for immunization: Secondary | ICD-10-CM

## 2014-09-29 DIAGNOSIS — I6529 Occlusion and stenosis of unspecified carotid artery: Secondary | ICD-10-CM | POA: Diagnosis not present

## 2014-09-29 DIAGNOSIS — E039 Hypothyroidism, unspecified: Secondary | ICD-10-CM | POA: Diagnosis not present

## 2014-09-29 DIAGNOSIS — E785 Hyperlipidemia, unspecified: Secondary | ICD-10-CM | POA: Diagnosis not present

## 2014-09-29 DIAGNOSIS — I10 Essential (primary) hypertension: Secondary | ICD-10-CM | POA: Insufficient documentation

## 2014-09-29 DIAGNOSIS — G4700 Insomnia, unspecified: Secondary | ICD-10-CM

## 2014-09-29 DIAGNOSIS — F0153 Vascular dementia, unspecified severity, with mood disturbance: Secondary | ICD-10-CM

## 2014-09-29 DIAGNOSIS — K219 Gastro-esophageal reflux disease without esophagitis: Secondary | ICD-10-CM

## 2014-09-29 DIAGNOSIS — K589 Irritable bowel syndrome without diarrhea: Secondary | ICD-10-CM | POA: Diagnosis not present

## 2014-09-29 DIAGNOSIS — E0590 Thyrotoxicosis, unspecified without thyrotoxic crisis or storm: Secondary | ICD-10-CM | POA: Diagnosis not present

## 2014-09-29 DIAGNOSIS — G629 Polyneuropathy, unspecified: Secondary | ICD-10-CM | POA: Diagnosis not present

## 2014-09-29 DIAGNOSIS — F329 Major depressive disorder, single episode, unspecified: Secondary | ICD-10-CM

## 2014-09-29 DIAGNOSIS — F0151 Vascular dementia with behavioral disturbance: Secondary | ICD-10-CM

## 2014-09-29 DIAGNOSIS — F0150 Vascular dementia without behavioral disturbance: Secondary | ICD-10-CM

## 2014-09-29 DIAGNOSIS — G589 Mononeuropathy, unspecified: Secondary | ICD-10-CM | POA: Diagnosis not present

## 2014-09-29 MED ORDER — MELATONIN 3 MG PO TABS: 1.0000 | ORAL_TABLET | Freq: Every day | ORAL | Status: AC

## 2014-09-29 MED ORDER — SIMVASTATIN 10 MG PO TABS: ORAL_TABLET | ORAL | Status: DC

## 2014-09-29 MED ORDER — DICYCLOMINE HCL 10 MG PO CAPS: 10.0000 mg | ORAL_CAPSULE | Freq: Three times a day (TID) | ORAL | Status: DC

## 2014-09-29 NOTE — Progress Notes (Signed)
Patient ID: Brittany Hughes, female   DOB: 23-May-1928, 80 y.o.   MRN: [id number]    Chief Complaint  Patient presents with  . Medical Management of Chronic Issues   Allergies  Allergen Reactions  . Codeine Other (See Comments)    Possibly crazy actions   HPI 80 y/o female patient is here for routine visit.  Has noted black colored stool x 2 Has been having pain in right leg after walking and this is relieved after rest Her nerve pain is under control Has problem with sleep at night at times Would like 90 days supply on some of her meds Niece present this visit. Helps with her meds. Pt not driving Compliant with her meds Off coumadin now, completed 6 months of anitocagulation  Wt Readings from Last 3 Encounters:  09/29/14 158 lb (71.668 kg)  07/27/14 155 lb 3.2 oz (70.398 kg)  06/29/14 155 lb 6.4 oz (70.489 kg)   Review of Systems   Constitutional: Negative for fever, chills, weight loss, diaphoresis.   HENT: Negative for congestion, hearing loss and sore throat.    Eyes: Negative for blurred vision, double vision and discharge.   Respiratory: Negative for cough, sputum production, shortness of breath and wheezing.    Cardiovascular: Negative for chest pain, palpitations, orthopnea   Gastrointestinal: Negative for heartburn, nausea, vomiting, abdominal pain. Has IBS and frequent episodes of loose stool Genitourinary: negative for dysuria Musculoskeletal: Negative for back pain.   Skin: Negative for itching and rash.   Neurological: Negative for dizziness, focal weakness and headaches.   Psychiatric/Behavioral: Negative for depression. Has memory loss  Past Medical History  Diagnosis Date  . Atrophy of vulva   . Dysuria   . Hyperpotassemia   . Unspecified hereditary and idiopathic peripheral neuropathy   . Osteoporosis, unspecified   . Hypertension   . Vitamin deficiency   . Unspecified malignant neoplasm of skin, site unspecified   . Chronic kidney disease   .  Dermatophytosis of nail   . Other diseases of larynx   . Other B-complex deficiencies   . Mild cognitive impairment, so stated   . Other voice and resonance disorders   . Depression   . Hyperlipidemia   . Carotid stenosis   . Unspecified urinary incontinence   . Nephrolithiasis   . Macular degeneration     "left eye; growth behind that" (01/06/2014)  . GERD (gastroesophageal reflux disease)   . Reflux esophagitis   . DVT (deep venous thrombosis) 01/06/2014    RLE  . Seizures     "one, years ago" (01/06/2014)   Current Outpatient Prescriptions on File Prior to Visit  Medication Sig Dispense Refill  . amitriptyline (ELAVIL) 50 MG tablet TAKE 1 TABLET BY MOUTH AT BEDTIME 90 tablet 1  . amLODipine (NORVASC) 10 MG tablet TAKE 1 TABLET BY MOUTH EVERY DAY 90 tablet 1  . antiseptic oral rinse (BIOTENE) LIQD 1 application by Mouth Rinse route every 4 (four) hours as needed for dry mouth.    . calcium carbonate (OS-CAL) 600 MG TABS Take 600 mg by mouth 2 (two) times daily with a meal.     . dicyclomine (BENTYL) 10 MG capsule TAKE 1 CAPSULE (10 MG TOTAL) BY MOUTH 4 (FOUR) TIMES DAILY - BEFORE MEALS AND AT BEDTIME. 120 capsule 3  . FLUoxetine (PROZAC) 10 MG tablet TAKE 1 TABLET (10 MG TOTAL) BY MOUTH DAILY. 30 tablet 5  . metoprolol succinate (TOPROL-XL) 25 MG 24 hr tablet TAKE 1 TABLET BY  MOUTH EVERY DAY FOR BLOOD PRESSURE 90 tablet 1  . omeprazole (PRILOSEC) 20 MG capsule Take 1 capsule (20 mg total) by mouth daily. For Acid Reflux 30 capsule 3  . pentazocine-naloxone (TALWIN NX) 50-0.5 MG per tablet TAKE 1 TABLET EVERY 8 HOURS AS NEEDED 90 tablet 3  . Polyethyl Glycol-Propyl Glycol (SYSTANE) 0.4-0.3 % SOLN Apply 1 drop to eye daily as needed (for dry eyes).     No current facility-administered medications on file prior to visit.   Physical exam BP 130/68 mmHg  Pulse 59  Temp(Src) 97.7 F (36.5 C) (Oral)  Resp 18  Ht 5\' 1"  (1.549 m)  Wt 158 lb (71.668 kg)  BMI 29.87 kg/m2  SpO2  97%  Constitutional: She is oriented to person, place, and time. No distress.  HENT:  Head: Normocephalic and atraumatic.   Mouth/Throat: Oropharynx is clear and moist.   Eyes: Pupils are equal, round, and reactive to light.   Neck: Neck supple.   Cardiovascular: Normal rate, regular rhythm and normal heart sounds.    No murmur heard. Pulmonary/Chest: Effort normal and breath sounds normal. No respiratory distress. She has no wheezes.   Abdominal: Soft. Bowel sounds are normal. There is no tenderness. There is no rebound.  Musculoskeletal: able to move all 4 extremities, no assistive device today, left leg 2+ edema and normal right leg Rectal exam- 1 external hemorrhoids, normal stool color, guaiac stool negative for blood Skin: Skin is warm and dry.   Psychiatric: She has a normal mood   Labs:  Lab Results  Component Value Date   INR 1.9 07/27/2014   INR 2.3 06/29/2014   INR 2.0 05/11/2014   Lab Results  Component Value Date   WBC 10.8* 03/10/2014   HGB 11.4* 03/10/2014   HCT 33.9* 03/10/2014   MCV 90.2 03/10/2014   PLT 150 03/10/2014   CMP     Component Value Date/Time   NA 142 03/10/2014 1530   NA 142 11/12/2013 1201   K 4.0 03/10/2014 1530   CL 103 03/10/2014 1530   CO2 25 03/10/2014 1530   GLUCOSE 108* 03/10/2014 1530   GLUCOSE 68 11/12/2013 1201   BUN 20 03/10/2014 1530   BUN 15 11/12/2013 1201   CREATININE 1.12* 03/10/2014 1530   CALCIUM 9.5 03/10/2014 1530   PROT 6.4 05/21/2013 0941   AST 18 05/21/2013 0941   ALT 8 05/21/2013 0941   ALKPHOS 53 05/21/2013 0941   BILITOT 0.1 05/21/2013 0941   GFRNONAA 44* 03/10/2014 1530   GFRAA 51* 03/10/2014 1530   Assessment/plan  1. Neuropathy Ongoing, recheck thyroid panel. No falls reported. Symptomatic management. continue - TSH  2. Essential hypertension Continue norvasc with toprol xl for now - CMP - Lipid Panel - CBC with Differential  3. Gastroesophageal reflux disease, esophagitis presence not  specified Continue prilosec for now  4. Dementia, vascular, with depression Stable, continue bp medications. Check mmse next visit. Niece provides assistance with her meds and driving around - TSH  5. Hyperlipidemia with target LDL less than 100 Continue statin. - TSH  6. IBS (irritable bowel syndrome) To take OTC imodium prn for loose stool. Continue bentyl. Stool negative for blood  7. Insomnia Continue elavil and add melatonin 3 mg qhs prn for now  Received influenza vaccine today

## 2014-09-30 LAB — COMPREHENSIVE METABOLIC PANEL
A/G RATIO: 1.9 (ref 1.1–2.5)
ALT: 14 IU/L (ref 0–32)
AST: 16 IU/L (ref 0–40)
Albumin: 4.2 g/dL (ref 3.5–4.7)
Alkaline Phosphatase: 71 IU/L (ref 39–117)
BUN / CREAT RATIO: 10 — AB (ref 11–26)
BUN: 13 mg/dL (ref 8–27)
CO2: 23 mmol/L (ref 18–29)
CREATININE: 1.34 mg/dL — AB (ref 0.57–1.00)
Calcium: 9.3 mg/dL (ref 8.7–10.3)
Chloride: 101 mmol/L (ref 97–108)
GFR calc Af Amer: 42 mL/min/{1.73_m2} — ABNORMAL LOW (ref >59)
GFR calc non Af Amer: 36 mL/min/{1.73_m2} — ABNORMAL LOW (ref >59)
Globulin, Total: 2.2 g/dL (ref 1.5–4.5)
Glucose: 97 mg/dL (ref 65–99)
Potassium: 4.6 mmol/L (ref 3.5–5.2)
SODIUM: 141 mmol/L (ref 134–144)
Total Bilirubin: 0.2 mg/dL (ref 0.0–1.2)
Total Protein: 6.4 g/dL (ref 6.0–8.5)

## 2014-09-30 LAB — TSH: TSH: 7.62 u[IU]/mL — AB (ref 0.450–4.500)

## 2014-09-30 LAB — CBC WITH DIFFERENTIAL/PLATELET
Basophils Absolute: 0 10*3/uL (ref 0.0–0.2)
Basos: 0 %
Eos: 2 %
Eosinophils Absolute: 0.1 10*3/uL (ref 0.0–0.4)
HCT: 34.9 % (ref 34.0–46.6)
HEMOGLOBIN: 11.4 g/dL (ref 11.1–15.9)
IMMATURE GRANULOCYTES: 0 %
Immature Grans (Abs): 0 10*3/uL (ref 0.0–0.1)
Lymphocytes Absolute: 2.3 10*3/uL (ref 0.7–3.1)
Lymphs: 31 %
MCH: 29.5 pg (ref 26.6–33.0)
MCHC: 32.7 g/dL (ref 31.5–35.7)
MCV: 90 fL (ref 79–97)
MONOCYTES: 8 %
Monocytes Absolute: 0.6 10*3/uL (ref 0.1–0.9)
Neutrophils Absolute: 4.4 10*3/uL (ref 1.4–7.0)
Neutrophils Relative %: 59 %
RBC: 3.86 x10E6/uL (ref 3.77–5.28)
RDW: 15.1 % (ref 12.3–15.4)
WBC: 7.4 10*3/uL (ref 3.4–10.8)

## 2014-09-30 LAB — LIPID PANEL
CHOL/HDL RATIO: 3.7 ratio (ref 0.0–4.4)
Cholesterol, Total: 187 mg/dL (ref 100–199)
HDL: 51 mg/dL (ref >39)
LDL Calculated: 80 mg/dL (ref 0–99)
Triglycerides: 278 mg/dL — ABNORMAL HIGH (ref 0–149)
VLDL Cholesterol Cal: 56 mg/dL — ABNORMAL HIGH (ref 5–40)

## 2014-10-05 ENCOUNTER — Telehealth: Payer: Self-pay | Admitting: *Deleted

## 2014-10-05 ENCOUNTER — Encounter: Payer: Self-pay | Admitting: *Deleted

## 2014-10-05 ENCOUNTER — Other Ambulatory Visit: Payer: Self-pay | Admitting: Internal Medicine

## 2014-10-05 DIAGNOSIS — N184 Chronic kidney disease, stage 4 (severe): Secondary | ICD-10-CM

## 2014-10-05 DIAGNOSIS — E039 Hypothyroidism, unspecified: Secondary | ICD-10-CM

## 2014-10-05 MED ORDER — SIMVASTATIN 20 MG PO TABS: 20.0000 mg | ORAL_TABLET | Freq: Every day | ORAL | Status: DC

## 2014-10-05 NOTE — Telephone Encounter (Signed)
Discussed result's with patient, patient verbalized understanding, copy of report mailed to patient. Future order's placed for add on labs.

## 2014-10-05 NOTE — Telephone Encounter (Signed)
-----   Message from Blanchie Serve, MD sent at 09/30/2014  5:02 PM EST ----- Worsened cholesterol level. Let's increase your simvastatin to 20 mg daily. Your thyroid level is suggestive of underactive thyroid. Add t3 and t4 to her lab to help assess further. Kidney function has worsened. No NSAIDs. Encourage hydration. Provide renal referral for ckd stage 4 if has not seen one

## 2014-10-07 ENCOUNTER — Other Ambulatory Visit: Payer: Self-pay | Admitting: *Deleted

## 2014-10-07 ENCOUNTER — Telehealth: Payer: Self-pay

## 2014-10-07 LAB — T4: T4 TOTAL: 5.7 ug/dL (ref 4.5–12.0)

## 2014-10-07 LAB — T3, FREE: T3 FREE: 3.2 pg/mL (ref 2.0–4.4)

## 2014-10-07 LAB — SPECIMEN STATUS REPORT

## 2014-10-07 MED ORDER — DICYCLOMINE HCL 10 MG PO CAPS: 10.0000 mg | ORAL_CAPSULE | Freq: Three times a day (TID) | ORAL | Status: DC

## 2014-10-07 MED ORDER — LEVOTHYROXINE SODIUM 25 MCG PO TABS: 25.0000 ug | ORAL_TABLET | Freq: Every day | ORAL | Status: DC

## 2014-10-07 NOTE — Telephone Encounter (Signed)
Terri, Caregiver called and stated that patient called her and did not understand what was told to her regarding her labs. I spoke with Terri and gave her all details regarding labs and changes in medications. She also requested a 90 day supply of Bentyl to be called into pharmacy. Faxed. And lab appointment made.

## 2014-10-07 NOTE — Telephone Encounter (Signed)
Discussed final results of thyroid testing, sent in rx for thyroid medication. Patient verbalized understanding of results

## 2014-10-09 ENCOUNTER — Ambulatory Visit (INDEPENDENT_AMBULATORY_CARE_PROVIDER_SITE_OTHER): Payer: Medicare Other | Admitting: Ophthalmology

## 2014-10-09 DIAGNOSIS — H35033 Hypertensive retinopathy, bilateral: Secondary | ICD-10-CM | POA: Diagnosis not present

## 2014-10-09 DIAGNOSIS — H35373 Puckering of macula, bilateral: Secondary | ICD-10-CM

## 2014-10-09 DIAGNOSIS — H3531 Nonexudative age-related macular degeneration: Secondary | ICD-10-CM

## 2014-10-09 DIAGNOSIS — I10 Essential (primary) hypertension: Secondary | ICD-10-CM

## 2014-10-09 DIAGNOSIS — H43813 Vitreous degeneration, bilateral: Secondary | ICD-10-CM

## 2014-10-16 ENCOUNTER — Other Ambulatory Visit: Payer: Self-pay | Admitting: Internal Medicine

## 2014-10-21 DIAGNOSIS — N183 Chronic kidney disease, stage 3 (moderate): Secondary | ICD-10-CM | POA: Diagnosis not present

## 2014-11-03 ENCOUNTER — Telehealth: Payer: Self-pay | Admitting: *Deleted

## 2014-11-03 NOTE — Telephone Encounter (Signed)
Patient called and stated that at Christmas she wasn't able to urinate for 2 days and when she finally urinated it felt like someone stabbed her. I offered her an appointment to be evaluated and she stated that she wasn't coming in. Stated that she would just watch it and see if it gets better. Told her she shouldn't wait but she wouldn't make an appointment. Dr. Bubba Camp made aware.

## 2014-11-09 ENCOUNTER — Ambulatory Visit (INDEPENDENT_AMBULATORY_CARE_PROVIDER_SITE_OTHER): Payer: Medicare Other | Admitting: Nurse Practitioner

## 2014-11-09 ENCOUNTER — Encounter: Payer: Self-pay | Admitting: Nurse Practitioner

## 2014-11-09 VITALS — BP 118/68 | HR 61 | Temp 98.7°F | Wt 149.0 lb

## 2014-11-09 DIAGNOSIS — R319 Hematuria, unspecified: Secondary | ICD-10-CM

## 2014-11-09 DIAGNOSIS — R300 Dysuria: Secondary | ICD-10-CM

## 2014-11-09 LAB — POCT URINALYSIS DIPSTICK
BILIRUBIN UA: NEGATIVE
GLUCOSE UA: NEGATIVE
KETONES UA: NEGATIVE
Nitrite, UA: NEGATIVE
PROTEIN UA: 300
SPEC GRAV UA: 1.01
UROBILINOGEN UA: NEGATIVE
pH, UA: 6

## 2014-11-09 NOTE — Progress Notes (Signed)
Patient ID: Brittany Hughes, female   DOB: 1928-07-15, 81 y.o.   MRN: [id number]    PCP: Brittany Serve, MD  Allergies  Allergen Reactions  . Codeine Other (See Comments)    Possibly crazy actions    Chief Complaint  Patient presents with  . burning    with urination for 2 days. Had leakage first, then couldn't go.  Here with  niece Brittany Hughes      HPI: Patient is a 81 y.o. female seen in the office today for evaluation of burning with urination for 2 days. Feels like she has to go and then unable to. Was having frequency 2 weeks ago, urine was negative. Does not drink a lot of fluid. No fevers. No confusion Hx of diarrhea, no constipation Review of Systems:  Review of Systems  Constitutional: Negative for activity change, appetite change and unexpected weight change.  Respiratory: Negative for shortness of breath.   Cardiovascular: Negative for chest pain and leg swelling.  Gastrointestinal: Negative for abdominal pain and constipation.  Genitourinary: Positive for dysuria, frequency and difficulty urinating. Negative for hematuria.  Musculoskeletal: Negative for arthralgias.  Skin: Negative for color change and wound.  Hematological: Bruises/bleeds easily.  Psychiatric/Behavioral: Negative for behavioral problems, confusion and agitation.    Past Medical History  Diagnosis Date  . Atrophy of vulva   . Dysuria   . Hyperpotassemia   . Unspecified hereditary and idiopathic peripheral neuropathy   . Osteoporosis, unspecified   . Hypertension   . Vitamin deficiency   . Unspecified malignant neoplasm of skin, site unspecified   . Chronic kidney disease   . Dermatophytosis of nail   . Other diseases of larynx   . Other B-complex deficiencies   . Mild cognitive impairment, so stated   . Other voice and resonance disorders   . Depression   . Hyperlipidemia   . Carotid stenosis   . Unspecified urinary incontinence   . Nephrolithiasis   . Macular degeneration     "left eye;  growth behind that" (01/06/2014)  . GERD (gastroesophageal reflux disease)   . Reflux esophagitis   . DVT (deep venous thrombosis) 01/06/2014    RLE  . Seizures     "one, years ago" (01/06/2014)   Past Surgical History  Procedure Laterality Date  . Stomach surgery  ~ 1960    "had ulcers for years; left scar tissue; did OR" (01/06/2014)  . Excisional hemorrhoidectomy  1970's?  . Carotid endarterectomy Right 2006  . Vaginal hysterectomy  ~ 1980  . Dilation and curettage of uterus    . Cataract extraction w/ intraocular lens  implant, bilateral Bilateral 1990's   Social History:   reports that she quit smoking about 3 years ago. Her smoking use included Cigarettes. She has a 30 pack-year smoking history. She has never used smokeless tobacco. She reports that she does not drink alcohol or use illicit drugs.  Family History  Problem Relation Age of Onset  . Heart disease Mother 46  . Kidney disease Father   . Diabetes Sister   . Heart disease Brother 66  . Heart attack Son 63  . Cancer Son     Throat  . Stroke Brother   . Cancer Brother   . Cancer Sister     Kidney    Medications: Patient's Medications  New Prescriptions   No medications on file  Previous Medications   AMITRIPTYLINE (ELAVIL) 50 MG TABLET    TAKE 1 TABLET BY MOUTH AT BEDTIME  AMLODIPINE (NORVASC) 10 MG TABLET    TAKE 1 TABLET BY MOUTH EVERY DAY   ANTISEPTIC ORAL RINSE (BIOTENE) LIQD    1 application by Mouth Rinse route every 4 (four) hours as needed for dry mouth.   CALCIUM CARBONATE (OS-CAL) 600 MG TABS    Take 600 mg by mouth 2 (two) times daily with a meal.    DICYCLOMINE (BENTYL) 10 MG CAPSULE    Take 1 capsule (10 mg total) by mouth 4 (four) times daily -  before meals and at bedtime. For IBS   FLUOXETINE (PROZAC) 10 MG TABLET    TAKE 1 TABLET (10 MG TOTAL) BY MOUTH DAILY.   LEVOTHYROXINE (SYNTHROID, LEVOTHROID) 25 MCG TABLET    Take 1 tablet (25 mcg total) by mouth daily before breakfast.   MELATONIN 3 MG  TABS    Take 1 tablet (3 mg total) by mouth daily.   METOPROLOL SUCCINATE (TOPROL-XL) 25 MG 24 HR TABLET    TAKE 1 TABLET BY MOUTH EVERY DAY FOR BLOOD PRESSURE   OMEPRAZOLE (PRILOSEC) 20 MG CAPSULE    Take 1 capsule (20 mg total) by mouth daily. For Acid Reflux   PENTAZOCINE-NALOXONE (TALWIN NX) 50-0.5 MG PER TABLET    TAKE 1 TABLET EVERY 8 HOURS AS NEEDED   POLYETHYL GLYCOL-PROPYL GLYCOL (SYSTANE) 0.4-0.3 % SOLN    Apply 1 drop to eye daily as needed (for dry eyes).   SIMVASTATIN (ZOCOR) 20 MG TABLET    TAKE 1 TABLET (20 MG TOTAL) BY MOUTH DAILY.  Modified Medications   No medications on file  Discontinued Medications   No medications on file     Physical Exam:  Filed Vitals:   11/09/14 1505  BP: 118/68  Pulse: 61  Temp: 98.7 F (37.1 C)  TempSrc: Oral  Weight: 149 lb (67.586 kg)  SpO2: 95%    Physical Exam  Constitutional: She is oriented to person, place, and time. She appears well-developed and well-nourished. No distress.  HENT:  Head: Normocephalic and atraumatic.  Mouth/Throat: Oropharynx is clear and moist. No oropharyngeal exudate.  Eyes: Conjunctivae are normal. Pupils are equal, round, and reactive to light.  Neck: Normal range of motion. Neck supple.  Cardiovascular: Normal rate, regular rhythm and normal heart sounds.   Pulmonary/Chest: Effort normal and breath sounds normal.  Abdominal: Soft. Bowel sounds are normal.  Musculoskeletal: She exhibits no edema or tenderness.  Neurological: She is alert and oriented to person, place, and time.  Skin: Skin is warm and dry. She is not diaphoretic.  Psychiatric: She has a normal mood and affect.    Labs reviewed: Basic Metabolic Panel:  Recent Labs  01/09/14 0440 03/10/14 1530 09/29/14 1059  NA 142 142 141  K 3.3* 4.0 4.6  CL 103 103 101  CO2 25 25 23   GLUCOSE 96 108* 97  BUN 18 20 13   CREATININE 1.23* 1.12* 1.34*  CALCIUM 9.0 9.5 9.3  TSH  --   --  7.620*   Liver Function Tests:  Recent Labs   09/29/14 1059  AST 16  ALT 14  ALKPHOS 71  BILITOT 0.2  PROT 6.4   No results for input(s): LIPASE, AMYLASE in the last 8760 hours. No results for input(s): AMMONIA in the last 8760 hours. CBC:  Recent Labs  01/08/14 0550 01/09/14 0440 03/10/14 1530 09/29/14 1059  WBC 6.0 6.1 10.8* 7.4  NEUTROABS  --   --  8.3* 4.4  HGB 10.3* 11.6* 11.4* 11.4  HCT 31.2* 34.6* 33.9*  34.9  MCV 92.3 91.1 90.2 90  PLT 177 184 150  --    Lipid Panel:  Recent Labs  04/07/14 1515 09/29/14 1059  HDL 57 51  LDLCALC 71 80  TRIG 196* 278*  CHOLHDL 2.9 3.7   TSH:  Recent Labs  09/29/14 1059  TSH 7.620*   A1C: No results found for: HGBA1C   Assessment/Plan 1. Dysuria -urine was done 2 weeks ago and was negative -no changes in mentation, CVA tenderness or fever -will wait for culture before calling in antibiotic -pt to increase water intake and limit sugars - Urinalysis, dipstick only; Future - POC Urinalysis Dipstick -follow up/return instructions given, pt and niece understand

## 2014-11-09 NOTE — Patient Instructions (Signed)
Increase water intake, at least 6 glasses a day Will call in antibiotic based on urine culture when this returns   Urinary Tract Infection Urinary tract infections (UTIs) can develop anywhere along your urinary tract. Your urinary tract is your body's drainage system for removing wastes and extra water. Your urinary tract includes two kidneys, two ureters, a bladder, and a urethra. Your kidneys are a pair of bean-shaped organs. Each kidney is about the size of your fist. They are located below your ribs, one on each side of your spine. CAUSES Infections are caused by microbes, which are microscopic organisms, including fungi, viruses, and bacteria. These organisms are so small that they can only be seen through a microscope. Bacteria are the microbes that most commonly cause UTIs. SYMPTOMS  Symptoms of UTIs may vary by age and gender of the patient and by the location of the infection. Symptoms in young women typically include a frequent and intense urge to urinate and a painful, burning feeling in the bladder or urethra during urination. Older women and men are more likely to be tired, shaky, and weak and have muscle aches and abdominal pain. A fever may mean the infection is in your kidneys. Other symptoms of a kidney infection include pain in your back or sides below the ribs, nausea, and vomiting. DIAGNOSIS To diagnose a UTI, your caregiver will ask you about your symptoms. Your caregiver also will ask to provide a urine sample. The urine sample will be tested for bacteria and white blood cells. White blood cells are made by your body to help fight infection. TREATMENT  Typically, UTIs can be treated with medication. Because most UTIs are caused by a bacterial infection, they usually can be treated with the use of antibiotics. The choice of antibiotic and length of treatment depend on your symptoms and the type of bacteria causing your infection. HOME CARE INSTRUCTIONS  If you were prescribed  antibiotics, take them exactly as your caregiver instructs you. Finish the medication even if you feel better after you have only taken some of the medication.  Drink enough water and fluids to keep your urine clear or pale yellow.  Avoid caffeine, tea, and carbonated beverages. They tend to irritate your bladder.  Empty your bladder often. Avoid holding urine for long periods of time.  Empty your bladder before and after sexual intercourse.  After a bowel movement, women should cleanse from front to back. Use each tissue only once. SEEK MEDICAL CARE IF:   You have back pain.  You develop a fever.  Your symptoms do not begin to resolve within 3 days. SEEK IMMEDIATE MEDICAL CARE IF:   You have severe back pain or lower abdominal pain.  You develop chills.  You have nausea or vomiting.  You have continued burning or discomfort with urination. MAKE SURE YOU:   Understand these instructions.  Will watch your condition.  Will get help right away if you are not doing well or get worse. Document Released: 08/02/2005 Document Revised: 04/23/2012 Document Reviewed: 12/01/2011 [facility] Patient Information 2015 [place], [place]. This information is not intended to replace advice given to you by your health care provider. Make sure you discuss any questions you have with your health care provider.

## 2014-11-11 LAB — URINE CULTURE

## 2014-11-12 ENCOUNTER — Telehealth: Payer: Self-pay

## 2014-11-12 MED ORDER — CIPROFLOXACIN HCL 250 MG PO TABS: 250.0000 mg | ORAL_TABLET | Freq: Two times a day (BID) | ORAL | Status: AC

## 2014-11-12 NOTE — Telephone Encounter (Signed)
-----   Message from Lauree Chandler, NP sent at 11/11/2014  4:10 PM EST ----- Urine positive for e coli, Please send Cipro 250 mg by mouth twice daily for 3 days,# 6 to pharmacy of choice  Cont to increase water intake

## 2014-11-13 NOTE — Telephone Encounter (Signed)
Discussed with patient yesterday, RX sent. Patient indicated she is increasing water intake

## 2014-12-02 ENCOUNTER — Other Ambulatory Visit: Payer: Self-pay

## 2014-12-08 ENCOUNTER — Other Ambulatory Visit: Payer: Medicare Other

## 2014-12-08 ENCOUNTER — Other Ambulatory Visit: Payer: Self-pay | Admitting: *Deleted

## 2014-12-08 DIAGNOSIS — E038 Other specified hypothyroidism: Secondary | ICD-10-CM

## 2014-12-09 LAB — T3: T3, Total: 100 ng/dL (ref 71–180)

## 2014-12-09 LAB — T4, FREE: Free T4: 0.77 ng/dL — ABNORMAL LOW (ref 0.82–1.77)

## 2014-12-09 LAB — TSH: TSH: 3.14 u[IU]/mL (ref 0.450–4.500)

## 2014-12-22 ENCOUNTER — Other Ambulatory Visit: Payer: Self-pay | Admitting: Internal Medicine

## 2014-12-23 ENCOUNTER — Other Ambulatory Visit: Payer: Self-pay | Admitting: *Deleted

## 2014-12-23 ENCOUNTER — Other Ambulatory Visit: Payer: Self-pay | Admitting: Internal Medicine

## 2014-12-23 ENCOUNTER — Telehealth: Payer: Self-pay

## 2014-12-23 DIAGNOSIS — N2581 Secondary hyperparathyroidism of renal origin: Secondary | ICD-10-CM | POA: Diagnosis not present

## 2014-12-23 DIAGNOSIS — N183 Chronic kidney disease, stage 3 (moderate): Secondary | ICD-10-CM | POA: Diagnosis not present

## 2014-12-23 MED ORDER — AMITRIPTYLINE HCL 50 MG PO TABS: 50.0000 mg | ORAL_TABLET | Freq: Every day | ORAL | Status: DC

## 2014-12-23 MED ORDER — LEVOTHYROXINE SODIUM 25 MCG PO TABS: 25.0000 ug | ORAL_TABLET | Freq: Every day | ORAL | Status: DC

## 2014-12-23 MED ORDER — SIMVASTATIN 20 MG PO TABS: ORAL_TABLET | ORAL | Status: DC

## 2014-12-23 MED ORDER — OMEPRAZOLE 20 MG PO CPDR: 20.0000 mg | DELAYED_RELEASE_CAPSULE | Freq: Every day | ORAL | Status: DC

## 2014-12-23 NOTE — Telephone Encounter (Signed)
Brittany Hughes called and left message on triage voice mail requesting refills for Thyroid medication.  I returned Brittany Hughes's call, patient needs 90 day supply on 4 medications Levothyroxine Simvastatin Omeprazole Elavil  All rx's sent to [facility] for 90 day supplies

## 2014-12-24 ENCOUNTER — Other Ambulatory Visit: Payer: Self-pay | Admitting: Internal Medicine

## 2014-12-29 ENCOUNTER — Encounter: Payer: Self-pay | Admitting: Internal Medicine

## 2014-12-30 ENCOUNTER — Other Ambulatory Visit: Payer: Self-pay | Admitting: *Deleted

## 2014-12-30 ENCOUNTER — Other Ambulatory Visit: Payer: Self-pay | Admitting: Internal Medicine

## 2014-12-30 DIAGNOSIS — I129 Hypertensive chronic kidney disease with stage 1 through stage 4 chronic kidney disease, or unspecified chronic kidney disease: Secondary | ICD-10-CM | POA: Diagnosis not present

## 2014-12-30 DIAGNOSIS — N2581 Secondary hyperparathyroidism of renal origin: Secondary | ICD-10-CM | POA: Diagnosis not present

## 2014-12-30 DIAGNOSIS — N183 Chronic kidney disease, stage 3 (moderate): Secondary | ICD-10-CM | POA: Diagnosis not present

## 2014-12-30 DIAGNOSIS — D631 Anemia in chronic kidney disease: Secondary | ICD-10-CM | POA: Diagnosis not present

## 2015-02-26 ENCOUNTER — Other Ambulatory Visit: Payer: Self-pay

## 2015-02-26 MED ORDER — PENTAZOCINE-NALOXONE HCL 50-0.5 MG PO TABS: 1.0000 | ORAL_TABLET | Freq: Three times a day (TID) | ORAL | Status: DC | PRN

## 2015-03-11 DIAGNOSIS — C44729 Squamous cell carcinoma of skin of left lower limb, including hip: Secondary | ICD-10-CM | POA: Diagnosis not present

## 2015-03-31 ENCOUNTER — Other Ambulatory Visit: Payer: Self-pay | Admitting: Internal Medicine

## 2015-04-06 ENCOUNTER — Encounter: Payer: Medicare Other | Admitting: Internal Medicine

## 2015-04-13 ENCOUNTER — Encounter: Payer: Medicare Other | Admitting: Internal Medicine

## 2015-04-15 ENCOUNTER — Ambulatory Visit
Admission: RE | Admit: 2015-04-15 | Discharge: 2015-04-15 | Disposition: A | Discharge status: Home / Self Care | Payer: Medicare Other | Source: Ambulatory Visit | Attending: Nurse Practitioner | Admitting: Nurse Practitioner

## 2015-04-15 ENCOUNTER — Ambulatory Visit (INDEPENDENT_AMBULATORY_CARE_PROVIDER_SITE_OTHER): Payer: Medicare Other | Admitting: Nurse Practitioner

## 2015-04-15 ENCOUNTER — Encounter: Payer: Self-pay | Admitting: Nurse Practitioner

## 2015-04-15 VITALS — BP 138/78 | HR 68 | Temp 97.9°F | Resp 20 | Ht 61.0 in | Wt 159.0 lb

## 2015-04-15 DIAGNOSIS — R609 Edema, unspecified: Secondary | ICD-10-CM | POA: Diagnosis not present

## 2015-04-15 DIAGNOSIS — G609 Hereditary and idiopathic neuropathy, unspecified: Secondary | ICD-10-CM

## 2015-04-15 DIAGNOSIS — K5900 Constipation, unspecified: Secondary | ICD-10-CM

## 2015-04-15 DIAGNOSIS — K2100 Gastro-esophageal reflux disease with esophagitis, without bleeding: Secondary | ICD-10-CM

## 2015-04-15 DIAGNOSIS — E785 Hyperlipidemia, unspecified: Secondary | ICD-10-CM | POA: Diagnosis not present

## 2015-04-15 DIAGNOSIS — E039 Hypothyroidism, unspecified: Secondary | ICD-10-CM

## 2015-04-15 DIAGNOSIS — G3184 Mild cognitive impairment, so stated: Secondary | ICD-10-CM

## 2015-04-15 DIAGNOSIS — I10 Essential (primary) hypertension: Secondary | ICD-10-CM

## 2015-04-15 NOTE — Patient Instructions (Addendum)
Go to [facility] for xray of abdomen due to constipation Will schedule doppler of your leg to rule out DVT  Bring your niece with you to your next appt- 1 month  Bring ALL your medications with out that you take   will get xray and may have further recommendations but for now For constipation recommend colace 100 mg daily Increase fluid intake

## 2015-04-15 NOTE — Progress Notes (Signed)
Patient ID: Brittany Hughes, female   DOB: 02-09-1928, 81 y.o.   MRN: [id number]    PCP: Lauree Chandler, NP  Allergies  Allergen Reactions  . Codeine Other (See Comments)    Possibly crazy actions    Chief Complaint  Patient presents with  . Annual Exam    left leg swollen since January,      HPI: Patient is a 81 y.o. female seen in the office today for extended visit.  Pt with a pmh of GERD, anxiety and depression. DVT, memory loss, constipation, urinary incontinence   Pt reports she fell and broke her arm and had blood clot in January  Left leg has been swollen since January. Pain in both legs.   Reports diarrhea with constipation. Takes medication but unsure what it is- takes imodium when she has diarrhea.  Had constipation this week- had to take 3 suppository and digitally remove impaction herself at home.   Has not been getting colonoscopy because she does not want to go through all of that again.   Pt reports she does not drive any more because she got lost and went the wrong way, decided it was smart for her not to drive after that.   Hx of anxiety and depression but does not take prozac routinely.   mammogram in 2013, does not want any more   Advanced Directive information Does patient have an advance directive?: Yes Review of Systems:  Review of Systems  Constitutional: Negative for activity change, appetite change, fatigue and unexpected weight change.  HENT: Negative for congestion and hearing loss.   Eyes: Negative.   Respiratory: Negative for cough and shortness of breath.   Cardiovascular: Positive for leg swelling (left leg swelling). Negative for chest pain and palpitations.  Gastrointestinal: Positive for constipation. Negative for abdominal pain and diarrhea.       Pt reports constipation mixed with diarrhea Notes blood in stool only after she has strained to have BM (HX of hemorrhoids)  Genitourinary: Negative for dysuria and difficulty urinating.       Urinary incontinence   Musculoskeletal: Positive for arthralgias. Negative for myalgias.       Pain in legs  Skin: Negative for color change and wound.       Left foot with scab, pt reports this is being followed by dermatology  Dry skin  Neurological: Negative for dizziness and weakness.  Psychiatric/Behavioral: Positive for confusion (notes memory loss). Negative for agitation.    Past Medical History  Diagnosis Date  . Atrophy of vulva   . Dysuria   . Hyperpotassemia   . Unspecified hereditary and idiopathic peripheral neuropathy   . Osteoporosis, unspecified   . Hypertension   . Vitamin deficiency   . Chronic kidney disease   . Dermatophytosis of nail   . Other diseases of larynx   . Other B-complex deficiencies   . Mild cognitive impairment, so stated   . Other voice and resonance disorders   . Depression   . Hyperlipidemia   . Carotid stenosis   . Unspecified urinary incontinence   . Nephrolithiasis   . Macular degeneration     "left eye; growth behind that" (01/06/2014)  . GERD (gastroesophageal reflux disease)   . Reflux esophagitis   . DVT (deep venous thrombosis) 01/06/2014    RLE  . Seizures     "one, years ago" (01/06/2014)  . Unspecified malignant neoplasm of skin, site unspecified     left foot   Past Surgical  History  Procedure Laterality Date  . Stomach surgery  ~ 1960    "had ulcers for years; left scar tissue; did OR" (01/06/2014)  . Excisional hemorrhoidectomy  1970's?  . Carotid endarterectomy Right 2006  . Vaginal hysterectomy  ~ 1980  . Dilation and curettage of uterus    . Cataract extraction w/ intraocular lens  implant, bilateral Bilateral 1990's   Social History:   reports that she quit smoking about 3 years ago. Her smoking use included Cigarettes. She has a 30 pack-year smoking history. She has never used smokeless tobacco. She reports that she does not drink alcohol or use illicit drugs.  Family History  Problem Relation Age of Onset  .  Heart disease Mother 39  . Kidney disease Father   . Diabetes Sister   . Heart disease Brother 76  . Heart attack Son 28  . Cancer Son     Throat  . Stroke Brother   . Cancer Brother   . Cancer Sister     Kidney    Medications: Patient's Medications  New Prescriptions   No medications on file  Previous Medications   AMITRIPTYLINE (ELAVIL) 50 MG TABLET    Take 1 tablet (50 mg total) by mouth at bedtime.   AMLODIPINE (NORVASC) 10 MG TABLET    TAKE 1 TABLET BY MOUTH EVERY DAY   ANTISEPTIC ORAL RINSE (BIOTENE) LIQD    1 application by Mouth Rinse route every 4 (four) hours as needed for dry mouth.   CALCIUM CARBONATE (OS-CAL) 600 MG TABS    Take 600 mg by mouth 2 (two) times daily with a meal.    DICYCLOMINE (BENTYL) 10 MG CAPSULE    Take 1 capsule (10 mg total) by mouth 4 (four) times daily -  before meals and at bedtime. For IBS   LEVOTHYROXINE (SYNTHROID, LEVOTHROID) 25 MCG TABLET    Take 1 tablet (25 mcg total) by mouth daily before breakfast.   MELATONIN 3 MG TABS    Take 1 tablet (3 mg total) by mouth daily.   METOPROLOL SUCCINATE (TOPROL-XL) 25 MG 24 HR TABLET    TAKE 1 TABLET BY MOUTH EVERY DAY FOR BLOOD PRESSURE   OMEPRAZOLE (PRILOSEC) 20 MG CAPSULE    Take 1 capsule (20 mg total) by mouth daily. For Acid Reflux   PENTAZOCINE-NALOXONE (TALWIN NX) 50-0.5 MG PER TABLET    Take 1 tablet by mouth every 8 (eight) hours as needed.   POLYETHYL GLYCOL-PROPYL GLYCOL (SYSTANE) 0.4-0.3 % SOLN    Apply 1 drop to eye daily as needed (for dry eyes).   SIMVASTATIN (ZOCOR) 20 MG TABLET    TAKE 1 TABLET (20 MG TOTAL) BY MOUTH DAILY.  Modified Medications   No medications on file  Discontinued Medications   AMLODIPINE (NORVASC) 10 MG TABLET    TAKE 1 TABLET BY MOUTH EVERY DAY   FLUOXETINE (PROZAC) 10 MG TABLET    TAKE 1 TABLET (10 MG TOTAL) BY MOUTH DAILY.   METOPROLOL SUCCINATE (TOPROL-XL) 25 MG 24 HR TABLET    TAKE 1 TABLET BY MOUTH EVERY DAY FOR BLOOD PRESSURE   PENTAZOCINE-NALOXONE (TALWIN  NX) 50-0.5 MG PER TABLET    TAKE 1 TABLET BY MOUTH EVERY 8 HOURS AS NEEDED     Physical Exam:  Filed Vitals:   04/15/15 1111  BP: 138/78  Pulse: 68  Temp: 97.9 F (36.6 C)  TempSrc: Oral  Resp: 20  Height: '5\' 1"'$  (1.549 m)  Weight: 159 lb (72.122 kg)  SpO2: 95%    Physical Exam  Constitutional: She is oriented to person, place, and time. She appears well-developed and well-nourished. No distress.  HENT:  Head: Normocephalic and atraumatic.  Mouth/Throat: Oropharynx is clear and moist. No oropharyngeal exudate.  Eyes: Conjunctivae are normal. Pupils are equal, round, and reactive to light.  Neck: Normal range of motion. Neck supple.  Cardiovascular: Normal rate, regular rhythm and normal heart sounds.   Pulmonary/Chest: Effort normal and breath sounds normal.  Abdominal: Soft. Bowel sounds are normal.  Rounded abdomen   Genitourinary: Rectal exam shows external hemorrhoid.  No impaction noted on rectal exam however blood and mucous noted with stool  Musculoskeletal: She exhibits no edema or tenderness.  Neurological: She is alert and oriented to person, place, and time.  Skin: Skin is warm and dry. She is not diaphoretic.  Dry flaky skin throughout  Small dize sized scab to top of left foot, pt reports this is from area dermatology removed   Psychiatric: She has a normal mood and affect.    Labs reviewed: Basic Metabolic Panel:  Recent Labs  09/29/14 1059 12/08/14 1108  NA 141  --   K 4.6  --   CL 101  --   CO2 23  --   GLUCOSE 97  --   BUN 13  --   CREATININE 1.34*  --   CALCIUM 9.3  --   TSH 7.620* 3.140   Liver Function Tests:  Recent Labs  09/29/14 1059  AST 16  ALT 14  ALKPHOS 71  BILITOT 0.2  PROT 6.4   No results for input(s): LIPASE, AMYLASE in the last 8760 hours. No results for input(s): AMMONIA in the last 8760 hours. CBC:  Recent Labs  09/29/14 1059  WBC 7.4  NEUTROABS 4.4  HGB 11.4  HCT 34.9  MCV 90   Lipid Panel:  Recent  Labs  09/29/14 1059  CHOL 187  HDL 51  LDLCALC 80  TRIG 278*  CHOLHDL 3.7   TSH:  Recent Labs  09/29/14 1059 12/08/14 1108  TSH 7.620* 3.140   A1C: No results found for: HGBA1C   Assessment/Plan 1. Essential hypertension -conts metoprolol and norvasc  - Comprehensive metabolic panel - CBC with Differential  2. Reflux esophagitis -chronic GERD, conts omeprazole   3. Mild cognitive impairment -pt scoring 28/30 on MMSE, failed clock however very poor historian, references everything to January of this year (swelling since January, hospitalized since January, constipated since January, DVT in January which was actual dx March 2015)   4. Hereditary and idiopathic peripheral neuropathy -reports chronic neuropathy, not on medications, after reviewing hx gabapentin tired in the past which caused dizziness -conts on talwin nx   5. Edema -left leg swelling, mild tenderness noted on exam. Hx of right leg DVT, will get Lower Extremity Venous Ultrasound to evaluate   6. Hypothyroidism, unspecified hypothyroidism type -taking synthroid 25 mcg daily will follow up lab - TSH  7. Hyperlipemia -reports she is taking zocor 20 mg daily - Lipid panel  8. Constipation, unspecified constipation type -pt reports symptoms of constipation but then reports diarrhea and taking imodium which could be making constipation worse.  -not to take imodium -may use colace 100 mg daily - DG Abd 1 View  9. Insomnia -cont amitriptyline 50 mg daily    Follow up in 1 month on constipation, leg swelling and labs

## 2015-04-15 NOTE — Progress Notes (Signed)
Did not pass the clock test.

## 2015-04-16 LAB — CBC WITH DIFFERENTIAL/PLATELET
BASOS: 0 %
Basophils Absolute: 0 10*3/uL (ref 0.0–0.2)
EOS (ABSOLUTE): 0.1 10*3/uL (ref 0.0–0.4)
Eos: 1 %
HEMOGLOBIN: 10.8 g/dL — AB (ref 11.1–15.9)
Hematocrit: 33.5 % — ABNORMAL LOW (ref 34.0–46.6)
IMMATURE GRANULOCYTES: 0 %
Immature Grans (Abs): 0 10*3/uL (ref 0.0–0.1)
Lymphocytes Absolute: 2 10*3/uL (ref 0.7–3.1)
Lymphs: 24 %
MCH: 28.2 pg (ref 26.6–33.0)
MCHC: 32.2 g/dL (ref 31.5–35.7)
MCV: 88 fL (ref 79–97)
Monocytes Absolute: 0.5 10*3/uL (ref 0.1–0.9)
Monocytes: 6 %
NEUTROS ABS: 5.9 10*3/uL (ref 1.4–7.0)
Neutrophils: 69 %
PLATELETS: 173 10*3/uL (ref 150–379)
RBC: 3.83 x10E6/uL (ref 3.77–5.28)
RDW: 15.7 % — ABNORMAL HIGH (ref 12.3–15.4)
WBC: 8.6 10*3/uL (ref 3.4–10.8)

## 2015-04-16 LAB — COMPREHENSIVE METABOLIC PANEL
ALK PHOS: 71 IU/L (ref 39–117)
ALT: 13 IU/L (ref 0–32)
AST: 18 IU/L (ref 0–40)
Albumin/Globulin Ratio: 1.8 (ref 1.1–2.5)
Albumin: 4.3 g/dL (ref 3.5–4.7)
BUN/Creatinine Ratio: 13 (ref 11–26)
BUN: 15 mg/dL (ref 8–27)
Bilirubin Total: 0.2 mg/dL (ref 0.0–1.2)
CO2: 23 mmol/L (ref 18–29)
CREATININE: 1.13 mg/dL — AB (ref 0.57–1.00)
Calcium: 8.9 mg/dL (ref 8.7–10.3)
Chloride: 102 mmol/L (ref 97–108)
GFR calc non Af Amer: 45 mL/min/{1.73_m2} — ABNORMAL LOW (ref >59)
GFR, EST AFRICAN AMERICAN: 52 mL/min/{1.73_m2} — AB (ref >59)
GLUCOSE: 100 mg/dL — AB (ref 65–99)
Globulin, Total: 2.4 g/dL (ref 1.5–4.5)
Potassium: 4.6 mmol/L (ref 3.5–5.2)
Sodium: 142 mmol/L (ref 134–144)
Total Protein: 6.7 g/dL (ref 6.0–8.5)

## 2015-04-16 LAB — LIPID PANEL
CHOL/HDL RATIO: 3.1 ratio (ref 0.0–4.4)
Cholesterol, Total: 168 mg/dL (ref 100–199)
HDL: 55 mg/dL (ref >39)
LDL Calculated: 70 mg/dL (ref 0–99)
Triglycerides: 217 mg/dL — ABNORMAL HIGH (ref 0–149)
VLDL CHOLESTEROL CAL: 43 mg/dL — AB (ref 5–40)

## 2015-04-16 LAB — TSH: TSH: 4.28 u[IU]/mL (ref 0.450–4.500)

## 2015-04-22 DIAGNOSIS — L570 Actinic keratosis: Secondary | ICD-10-CM | POA: Diagnosis not present

## 2015-04-22 DIAGNOSIS — Z08 Encounter for follow-up examination after completed treatment for malignant neoplasm: Secondary | ICD-10-CM | POA: Diagnosis not present

## 2015-04-22 DIAGNOSIS — Z85828 Personal history of other malignant neoplasm of skin: Secondary | ICD-10-CM | POA: Diagnosis not present

## 2015-04-28 ENCOUNTER — Encounter: Payer: Self-pay | Admitting: Nurse Practitioner

## 2015-04-28 ENCOUNTER — Other Ambulatory Visit: Payer: Self-pay | Admitting: Nurse Practitioner

## 2015-04-30 ENCOUNTER — Encounter: Payer: Self-pay | Admitting: Internal Medicine

## 2015-05-13 ENCOUNTER — Ambulatory Visit: Payer: Medicare Other | Admitting: Nurse Practitioner

## 2015-05-18 ENCOUNTER — Encounter: Payer: Self-pay | Admitting: Nurse Practitioner

## 2015-05-18 ENCOUNTER — Ambulatory Visit (INDEPENDENT_AMBULATORY_CARE_PROVIDER_SITE_OTHER): Payer: Medicare Other | Admitting: Nurse Practitioner

## 2015-05-18 VITALS — BP 142/60 | HR 74 | Temp 98.4°F | Resp 20 | Ht 61.0 in | Wt 157.8 lb

## 2015-05-18 DIAGNOSIS — E039 Hypothyroidism, unspecified: Secondary | ICD-10-CM | POA: Diagnosis not present

## 2015-05-18 DIAGNOSIS — K219 Gastro-esophageal reflux disease without esophagitis: Secondary | ICD-10-CM

## 2015-05-18 DIAGNOSIS — N183 Chronic kidney disease, stage 3 unspecified: Secondary | ICD-10-CM

## 2015-05-18 DIAGNOSIS — R609 Edema, unspecified: Secondary | ICD-10-CM | POA: Diagnosis not present

## 2015-05-18 DIAGNOSIS — I10 Essential (primary) hypertension: Secondary | ICD-10-CM | POA: Diagnosis not present

## 2015-05-18 DIAGNOSIS — K589 Irritable bowel syndrome without diarrhea: Secondary | ICD-10-CM | POA: Diagnosis not present

## 2015-05-18 NOTE — Progress Notes (Signed)
Patient ID: Brittany Hughes, female   DOB: Apr 05, 1928, 81 y.o.   MRN: [id number]    PCP: Sharon Seller, NP  Allergies  Allergen Reactions  . Codeine Other (See Comments)    Possibly crazy actions    Chief Complaint  Patient presents with  . Medical Management of Chronic Issues    follow up constipation, GERD, HTN and more     HPI: Patient is a 81 y.o. female seen in the office today for routine follow up. Pt with a pmh of constipation, hyperlipidemia, hypothyroid, edema, neuropathy, memory loss, htn.   Stomach and bowels are still causing problems.  Constipation and then having diarrhea - taking bentyl Has followed up with GI over the years but not recently- has not seen one in town in over 8 years.   Left Leg is still swollen and painful, unchanged since last visit -- did not go get doppler   No increase in shortness of breath or congestion Niece does medication  No worsening acid reflux  Pt with hx of CKD- reports she has seen a kidney doctor and everything is fine. Reports frequency of urination but no changes  Review of Systems:  Review of Systems  Constitutional: Negative for activity change, appetite change, fatigue and unexpected weight change.  HENT: Negative for congestion and hearing loss.   Eyes: Negative.   Respiratory: Negative for cough and shortness of breath.   Cardiovascular: Positive for leg swelling (left leg swelling). Negative for chest pain and palpitations.  Gastrointestinal: Positive for constipation. Negative for abdominal pain and diarrhea.       Pt reports constipation mixed with diarrhea HX of hemorrhoids- dark stools at times  Genitourinary: Negative for dysuria and difficulty urinating.       Urinary incontinence   Musculoskeletal: Positive for arthralgias. Negative for myalgias.       Pain in legs  Skin: Negative for color change and wound.  Neurological: Negative for dizziness and weakness.  Psychiatric/Behavioral: Positive for  confusion (notes memory loss). Negative for agitation.    Past Medical History  Diagnosis Date  . Atrophy of vulva   . Dysuria   . Hyperpotassemia   . Unspecified hereditary and idiopathic peripheral neuropathy   . Osteoporosis, unspecified   . Hypertension   . Vitamin deficiency   . Chronic kidney disease   . Dermatophytosis of nail   . Other diseases of larynx   . Other B-complex deficiencies   . Mild cognitive impairment, so stated   . Other voice and resonance disorders   . Depression   . Hyperlipidemia   . Carotid stenosis   . Unspecified urinary incontinence   . Nephrolithiasis   . Macular degeneration     "left eye; growth behind that" (01/06/2014)  . GERD (gastroesophageal reflux disease)   . Reflux esophagitis   . DVT (deep venous thrombosis) 01/06/2014    RLE  . Seizures     "one, years ago" (01/06/2014)  . Unspecified malignant neoplasm of skin, site unspecified     left foot   Past Surgical History  Procedure Laterality Date  . Stomach surgery  ~ 1960    "had ulcers for years; left scar tissue; did OR" (01/06/2014)  . Excisional hemorrhoidectomy  1970's?  . Carotid endarterectomy Right 2006  . Vaginal hysterectomy  ~ 1980  . Dilation and curettage of uterus    . Cataract extraction w/ intraocular lens  implant, bilateral Bilateral 1990's   Social History:   reports that  she quit smoking about 3 years ago. Her smoking use included Cigarettes. She has a 30 pack-year smoking history. She has never used smokeless tobacco. She reports that she does not drink alcohol or use illicit drugs.  Family History  Problem Relation Age of Onset  . Heart disease Mother 6  . Kidney disease Father   . Diabetes Sister   . Heart disease Brother 41  . Heart attack Son 42  . Cancer Son     Throat  . Stroke Brother   . Cancer Brother   . Cancer Sister     Kidney    Medications: Patient's Medications  New Prescriptions   No medications on file  Previous Medications    AMITRIPTYLINE (ELAVIL) 50 MG TABLET    Take 1 tablet (50 mg total) by mouth at bedtime.   AMLODIPINE (NORVASC) 10 MG TABLET    TAKE 1 TABLET BY MOUTH EVERY DAY   ANTISEPTIC ORAL RINSE (BIOTENE) LIQD    1 application by Mouth Rinse route every 4 (four) hours as needed for dry mouth.   CALCIUM CARBONATE (OS-CAL) 600 MG TABS    Take 600 mg by mouth 2 (two) times daily with a meal.    DICYCLOMINE (BENTYL) 10 MG CAPSULE    Take 1 capsule (10 mg total) by mouth 4 (four) times daily -  before meals and at bedtime. For IBS   LEVOTHYROXINE (SYNTHROID, LEVOTHROID) 25 MCG TABLET    Take 1 tablet (25 mcg total) by mouth daily before breakfast.   MELATONIN 3 MG TABS    Take 1 tablet (3 mg total) by mouth daily.   METOPROLOL SUCCINATE (TOPROL-XL) 25 MG 24 HR TABLET    TAKE 1 TABLET BY MOUTH EVERY DAY FOR BLOOD PRESSURE   OMEPRAZOLE (PRILOSEC) 20 MG CAPSULE    Take 1 capsule (20 mg total) by mouth daily. For Acid Reflux   PENTAZOCINE-NALOXONE (TALWIN NX) 50-0.5 MG PER TABLET    TAKE 1 TABLET BY MOUTH EVERY 8 HOURS AS NEEDED   POLYETHYL GLYCOL-PROPYL GLYCOL (SYSTANE) 0.4-0.3 % SOLN    Apply 1 drop to eye daily as needed (for dry eyes).   SIMVASTATIN (ZOCOR) 20 MG TABLET    TAKE 1 TABLET (20 MG TOTAL) BY MOUTH DAILY.  Modified Medications   No medications on file  Discontinued Medications   No medications on file     Physical Exam:  Filed Vitals:   05/18/15 1539  BP: 142/60  Pulse: 74  Temp: 98.4 F (36.9 C)  TempSrc: Oral  Resp: 20  Height: 5\' 1"  (1.549 m)  Weight: 157 lb 12.8 oz (71.578 kg)  SpO2: 97%    Physical Exam  Constitutional: She is oriented to person, place, and time. She appears well-developed and well-nourished. No distress.  HENT:  Head: Normocephalic and atraumatic.  Mouth/Throat: Oropharynx is clear and moist. No oropharyngeal exudate.  Eyes: Conjunctivae are normal. Pupils are equal, round, and reactive to light.  Neck: Normal range of motion. Neck supple.  Cardiovascular:  Normal rate, regular rhythm and normal heart sounds.   Pulmonary/Chest: Effort normal and breath sounds normal.  Abdominal: Soft. Bowel sounds are normal.  Rounded abdomen   Musculoskeletal: She exhibits edema (left leg with worsening edema, no edema on right). She exhibits no tenderness.  Neurological: She is alert and oriented to person, place, and time.  Skin: Skin is warm and dry. She is not diaphoretic.  Psychiatric: She has a normal mood and affect.    Labs reviewed:  Basic Metabolic Panel:  Recent Labs  16/10/96 1059 12/08/14 1108 04/15/15 1238  NA 141  --  142  K 4.6  --  4.6  CL 101  --  102  CO2 23  --  23  GLUCOSE 97  --  100*  BUN 13  --  15  CREATININE 1.34*  --  1.13*  CALCIUM 9.3  --  8.9  TSH 7.620* 3.140 4.280   Liver Function Tests:  Recent Labs  09/29/14 1059 04/15/15 1238  AST 16 18  ALT 14 13  ALKPHOS 71 71  BILITOT 0.2 0.2  PROT 6.4 6.7   No results for input(s): LIPASE, AMYLASE in the last 8760 hours. No results for input(s): AMMONIA in the last 8760 hours. CBC:  Recent Labs  09/29/14 1059 04/15/15 1238  WBC 7.4 8.6  NEUTROABS 4.4 5.9  HGB 11.4  --   HCT 34.9 33.5*  MCV 90  --    Lipid Panel:  Recent Labs  09/29/14 1059 04/15/15 1238  CHOL 187 168  HDL 51 55  LDLCALC 80 70  TRIG 278* 217*  CHOLHDL 3.7 3.1   TSH:  Recent Labs  09/29/14 1059 12/08/14 1108 04/15/15 1238  TSH 7.620* 3.140 4.280   A1C: No results found for: HGBA1C   Assessment/Plan 1. IBS (irritable bowel syndrome) -reports she is no longer taking imodium, bowels had improved for a while now back to diarrhea and constipation, previously was following with GI due to IBS but has not been seen in over 8 years.  -to cont bentyl at this time -to use benefiber daily -increase fluid intake - Ambulatory referral to Gastroenterology  2. Gastroesophageal reflux disease, esophagitis presence not specified GERD stable, cont on prilosec  3. Essential  hypertension Blood pressure stable, will cont current regimen   4. Chronic kidney disease, stage III (moderate) BUN/Cr stable on recent labs, to avoid NSAIDs and keep hydrated   5. Edema -unchanged, order [place] at prior visit but did not go get [place], reorder at this time due to hx of DVT - VAS [place] LOWER EXTREMITY VENOUS (DVT); Future  6. Hypothyroidism, unspecified hypothyroidism type TSH stable, cont current synthroid    Follow up in 3 months  Rodneshia Greenhouse K. Biagio Borg  [facility] [phone] 8 am - 5 pm) [phone] (after hours)

## 2015-05-18 NOTE — Patient Instructions (Addendum)
To take benefiber daily to help bulk up stools  Increase fluid intake.    Will get doppler of left leg to rule out DVT  Follow up in 3 months

## 2015-05-19 ENCOUNTER — Ambulatory Visit (HOSPITAL_COMMUNITY)
Admission: RE | Admit: 2015-05-19 | Discharge: 2015-05-19 | Disposition: A | Discharge status: Home / Self Care | Payer: Medicare Other | Source: Ambulatory Visit | Attending: Vascular Surgery | Admitting: Vascular Surgery

## 2015-05-19 DIAGNOSIS — R609 Edema, unspecified: Secondary | ICD-10-CM | POA: Diagnosis not present

## 2015-05-21 ENCOUNTER — Telehealth: Payer: Self-pay | Admitting: *Deleted

## 2015-05-21 NOTE — Telephone Encounter (Signed)
Received Venous Doppler Results of Lower Extremity due to Edema and history of DVT and the Impression came back stating NO evidence of deep or superficial vein thrombosis noted in the left lower extremity.  Patient notified and agreed.

## 2015-05-28 ENCOUNTER — Other Ambulatory Visit: Payer: Self-pay | Admitting: Internal Medicine

## 2015-06-02 ENCOUNTER — Encounter: Payer: Self-pay | Admitting: Gastroenterology

## 2015-06-03 ENCOUNTER — Ambulatory Visit: Payer: Medicare Other | Admitting: Nurse Practitioner

## 2015-06-16 ENCOUNTER — Other Ambulatory Visit: Payer: Self-pay | Admitting: Internal Medicine

## 2015-06-20 ENCOUNTER — Other Ambulatory Visit: Payer: Self-pay | Admitting: Internal Medicine

## 2015-06-22 ENCOUNTER — Other Ambulatory Visit: Payer: Self-pay | Admitting: Internal Medicine

## 2015-06-23 ENCOUNTER — Other Ambulatory Visit: Payer: Self-pay | Admitting: Internal Medicine

## 2015-06-28 ENCOUNTER — Other Ambulatory Visit: Payer: Self-pay | Admitting: Internal Medicine

## 2015-06-30 ENCOUNTER — Other Ambulatory Visit: Payer: Self-pay | Admitting: Internal Medicine

## 2015-07-22 DIAGNOSIS — Z08 Encounter for follow-up examination after completed treatment for malignant neoplasm: Secondary | ICD-10-CM | POA: Diagnosis not present

## 2015-07-22 DIAGNOSIS — L570 Actinic keratosis: Secondary | ICD-10-CM | POA: Diagnosis not present

## 2015-07-22 DIAGNOSIS — Z85828 Personal history of other malignant neoplasm of skin: Secondary | ICD-10-CM | POA: Diagnosis not present

## 2015-07-22 DIAGNOSIS — C44629 Squamous cell carcinoma of skin of left upper limb, including shoulder: Secondary | ICD-10-CM | POA: Diagnosis not present

## 2015-08-05 ENCOUNTER — Encounter: Payer: Self-pay | Admitting: Gastroenterology

## 2015-08-11 ENCOUNTER — Ambulatory Visit: Payer: Medicare Other | Admitting: Gastroenterology

## 2015-08-19 ENCOUNTER — Ambulatory Visit: Payer: Medicare Other | Admitting: Nurse Practitioner

## 2015-08-19 ENCOUNTER — Other Ambulatory Visit: Payer: Self-pay | Admitting: Nurse Practitioner

## 2015-08-28 ENCOUNTER — Other Ambulatory Visit: Payer: Self-pay | Admitting: Nurse Practitioner

## 2015-09-02 ENCOUNTER — Ambulatory Visit: Payer: Medicare Other | Admitting: Nurse Practitioner

## 2015-09-19 ENCOUNTER — Other Ambulatory Visit: Payer: Self-pay | Admitting: Internal Medicine

## 2015-09-20 DIAGNOSIS — Z85828 Personal history of other malignant neoplasm of skin: Secondary | ICD-10-CM | POA: Diagnosis not present

## 2015-09-20 DIAGNOSIS — L570 Actinic keratosis: Secondary | ICD-10-CM | POA: Diagnosis not present

## 2015-09-24 ENCOUNTER — Encounter: Payer: Self-pay | Admitting: Gastroenterology

## 2015-09-24 ENCOUNTER — Ambulatory Visit (INDEPENDENT_AMBULATORY_CARE_PROVIDER_SITE_OTHER): Payer: Medicare Other | Admitting: Gastroenterology

## 2015-09-24 VITALS — BP 122/70 | HR 72 | Ht 61.0 in | Wt 161.6 lb

## 2015-09-24 DIAGNOSIS — K5909 Other constipation: Secondary | ICD-10-CM

## 2015-09-24 NOTE — Progress Notes (Signed)
HPI: This is a  very pleasant 81 year old woman    who was referred to me by Lauree Chandler, NP  to evaluate  alternating bowel habits .    Chief complaint is constipation, diarrhea  Has had many many years of GI issues.  Lately has battled constipation alternating with diarrhea.  Can be very iuncomfortable. Will have to wear depends at times.    Sometimes 15 BMs at night. Loose stools.  Sometimes will have no bm for 2-3 days.    Uses suppositories nearly daily. Also uses imodium periodically.  Takes bentyl qid.  Takes miralax.   Review of systems: Pertinent positive and negative review of systems were noted in the above HPI section. Complete review of systems was performed and was otherwise normal.   Past Medical History  Diagnosis Date  . Atrophy of vulva   . Dysuria   . Hyperpotassemia   . Unspecified hereditary and idiopathic peripheral neuropathy   . Osteoporosis, unspecified   . Hypertension   . Vitamin deficiency   . Chronic kidney disease   . Dermatophytosis of nail   . Other diseases of larynx   . Other B-complex deficiencies   . Mild cognitive impairment, so stated   . Other voice and resonance disorders   . Depression   . Hyperlipidemia   . Carotid stenosis   . Unspecified urinary incontinence   . Nephrolithiasis   . Macular degeneration     "left eye; growth behind that" (01/06/2014)  . GERD (gastroesophageal reflux disease)   . Reflux esophagitis   . DVT (deep venous thrombosis) ([place]) 01/06/2014    RLE  . Seizures ([place])     "one, years ago" (01/06/2014)  . Unspecified malignant neoplasm of skin, site unspecified     left foot    Past Surgical History  Procedure Laterality Date  . Stomach surgery  ~ 1960    "had ulcers for years; left scar tissue; did OR" (01/06/2014)  . Excisional hemorrhoidectomy  1970's?  . Carotid endarterectomy Right 2006  . Vaginal hysterectomy  ~ 1980  . Dilation and curettage of uterus    . Cataract extraction w/ intraocular  lens  implant, bilateral Bilateral 1990's    Current Outpatient Prescriptions  Medication Sig Dispense Refill  . amitriptyline (ELAVIL) 50 MG tablet TAKE 1 TABLET BY MOUTH AT BEDTIME 90 tablet 1  . amLODipine (NORVASC) 10 MG tablet TAKE 1 TABLET BY MOUTH EVERY DAY 90 tablet 1  . antiseptic oral rinse (BIOTENE) LIQD 1 application by Mouth Rinse route every 4 (four) hours as needed for dry mouth.    . calcium carbonate (OS-CAL) 600 MG TABS Take 600 mg by mouth 2 (two) times daily with a meal.     . dicyclomine (BENTYL) 10 MG capsule TAKE 1 CAPSULE (10 MG TOTAL) BY MOUTH 4 (FOUR) TIMES DAILY - BEFORE MEALS AND AT BEDTIME. FOR IBS 360 capsule 2  . levothyroxine (SYNTHROID, LEVOTHROID) 25 MCG tablet TAKE 1 TABLET (25 MCG TOTAL) BY MOUTH DAILY BEFORE BREAKFAST. 90 tablet 1  . Melatonin 3 MG TABS Take 1 tablet (3 mg total) by mouth daily. 30 tablet 0  . metoprolol succinate (TOPROL-XL) 25 MG 24 hr tablet TAKE 1 TABLET BY MOUTH EVERY DAY FOR BLOOD PRESSURE 90 tablet 1  . omeprazole (PRILOSEC) 20 MG capsule TAKE 1 CAPSULE (20 MG TOTAL) BY MOUTH DAILY. FOR ACID REFLUX 90 capsule 1  . pentazocine-naloxone (TALWIN NX) 50-0.5 MG tablet **FILL ON 8/23** TAKE 1 TABLET BY  MOUTH EVERY 8 HOURS AS NEEDED 90 tablet 0  . Polyethyl Glycol-Propyl Glycol (SYSTANE) 0.4-0.3 % SOLN Apply 1 drop to eye daily as needed (for dry eyes).    . simvastatin (ZOCOR) 20 MG tablet TAKE 1 TABLET (20 MG TOTAL) BY MOUTH DAILY. 90 tablet 1   No current facility-administered medications for this visit.    Allergies as of 09/24/2015 - Review Complete 09/24/2015  Allergen Reaction Noted  . Codeine Other (See Comments) 03/10/2014    Family History  Problem Relation Age of Onset  . Heart disease Mother 65  . Kidney disease Father   . Diabetes Sister   . Heart disease Brother 28  . Heart attack Son 7  . Cancer Son     Throat  . Stroke Brother   . Cancer Brother   . Cancer Sister     Kidney    Social History   Social  History  . Marital Status: Widowed    Spouse Name: N/A  . Number of Children: N/A  . Years of Education: N/A   Occupational History  . Not on file.   Social History Main Topics  . Smoking status: Former Smoker -- 0.50 packs/day for 60 years    Types: Cigarettes    Quit date: 10/31/2011  . Smokeless tobacco: Never Used  . Alcohol Use: No  . Drug Use: No  . Sexual Activity: No   Other Topics Concern  . Not on file   Social History Narrative     Physical Exam: BP 122/70 mmHg  Pulse 72  Ht '5\' 1"'$  (1.549 m)  Wt 161 lb 9.6 oz (73.301 kg)  BMI 30.55 kg/m2  SpO2 98% Constitutional: generally well-appearing Psychiatric: alert and oriented x3 Eyes: extraocular movements intact Mouth: oral pharynx moist, no lesions Neck: supple no lymphadenopathy Cardiovascular: heart regular rate and rhythm Lungs: clear to auscultation bilaterally Abdomen: soft, nontender, nondistended, no obvious ascites, no peritoneal signs, normal bowel sounds Extremities: no lower extremity edema bilaterally Skin: no lesions on visible extremities   Assessment and plan: 82 y.o. female with  alternating constipation diarrhea  She takes MiraLAX, she takes fiber supplements she takes Imodium, she takes Bentyl, she takes suppositories. All of these she takes on an as-needed basis and I think she is really having a therapeutic misadventure with all of these Addison's. I recommended she stop all of them beginning today. Her only bowel regimen is going to be Citrucel Orange powder fiber supplement gradually working up to a full heaping spoonful once daily. She will call to report on her response in 2-3 weeks. She will return to see me in 3 months and sooner if needed. She is 81 years old and I don't see urgent need to proceed with colonoscopy if conservative measures such as these are helpful however find not able to get her bowels under better control within 3-4 months and I likely would recommend.   Owens Loffler, MD [facility] 09/24/2015, 9:37 AM  Cc: Lauree Chandler, NP

## 2015-09-24 NOTE — Patient Instructions (Addendum)
Stop benefiber, stop miralax, stop all suppositories, stop imodium, stop bentyl. Please start taking citrucel (orange flavored) powder fiber supplement.  This may cause some bloating at first but that usually goes away. Begin with a small spoonful and work your way up to a large, heaping spoonful daily over a week. Call to report on your bowels in 2-3 weeks. Please return to see Dr. Ardis Hughs in 3 months.

## 2015-09-26 ENCOUNTER — Other Ambulatory Visit: Payer: Self-pay | Admitting: Nurse Practitioner

## 2015-09-27 ENCOUNTER — Telehealth: Payer: Self-pay

## 2015-09-27 NOTE — Telephone Encounter (Signed)
Message on triage, called back she has notice she is having some anxiety over small things and wonder about putting her on Alprazolam. She thinks she was on it before. I don't see that on her medication list or discontinue list. She has appt with Janett Billow on 10/05/15 she could talk to her then about the Alprazolam. That was ok.

## 2015-10-05 ENCOUNTER — Encounter: Payer: Self-pay | Admitting: Nurse Practitioner

## 2015-10-05 ENCOUNTER — Ambulatory Visit (INDEPENDENT_AMBULATORY_CARE_PROVIDER_SITE_OTHER): Payer: Medicare Other | Admitting: Nurse Practitioner

## 2015-10-05 VITALS — BP 124/78 | HR 60 | Temp 98.4°F | Resp 12 | Ht 61.0 in | Wt 160.0 lb

## 2015-10-05 DIAGNOSIS — R600 Localized edema: Secondary | ICD-10-CM | POA: Diagnosis not present

## 2015-10-05 DIAGNOSIS — G3184 Mild cognitive impairment, so stated: Secondary | ICD-10-CM

## 2015-10-05 DIAGNOSIS — K219 Gastro-esophageal reflux disease without esophagitis: Secondary | ICD-10-CM | POA: Diagnosis not present

## 2015-10-05 DIAGNOSIS — Z23 Encounter for immunization: Secondary | ICD-10-CM | POA: Diagnosis not present

## 2015-10-05 DIAGNOSIS — I10 Essential (primary) hypertension: Secondary | ICD-10-CM

## 2015-10-05 DIAGNOSIS — F411 Generalized anxiety disorder: Secondary | ICD-10-CM | POA: Diagnosis not present

## 2015-10-05 DIAGNOSIS — N183 Chronic kidney disease, stage 3 unspecified: Secondary | ICD-10-CM

## 2015-10-05 MED ORDER — ALPRAZOLAM 0.25 MG PO TABS: 0.2500 mg | ORAL_TABLET | Freq: Every day | ORAL | Status: DC | PRN

## 2015-10-05 NOTE — Progress Notes (Signed)
Patient ID: Brittany Hughes, female   DOB: 1928/04/29, 81 y.o.   MRN: [id number]    PCP: Lauree Chandler, NP  Advanced Directive information Does patient have an advance directive?: Yes, Type of Advance Directive: Living will, Does patient want to make changes to advanced directive?: No - Patient declined  Allergies  Allergen Reactions  . Codeine Other (See Comments)    Possibly crazy actions    Chief Complaint  Patient presents with  . Medical Management of Chronic Issues    3 month follow-up, here with Neice (Terri). Discuss restarting alprazolam  . Immunizations    Flu Vaccine today     HPI: Patient is a 81 y.o. female seen in the office today for routine follow up.  Pt with a hx of IBS, GERD, HTN, CKD, hypothyroid Pt here with niece.  Followed with GI and recommended Citrucel and diarrhea has stopped and bowels moving daily  Acid reflux unchanged, conts on prilosec   Good appetite. Eating a lot of bananas   Pt has been on lorazepam and alprazolam in the past. Lorazepam did not do well in the past.   Gets agitated when she can not remember something and then will get increased anxiety.  Was on prozac that was prescribed in the nursing facility but did not take it and does not take routinely.  Does not feel like anxiety is overwhelming, needing medication maybe a few times a month.  Reports depression at times due to living alone but feels like she copes effectively with the depression.      Review of Systems:  Review of Systems  Constitutional: Negative for activity change, appetite change, fatigue and unexpected weight change.  HENT: Negative for congestion and hearing loss.   Eyes: Negative.   Respiratory: Negative for cough and shortness of breath.   Cardiovascular: Negative for chest pain, palpitations and leg swelling.  Gastrointestinal: Negative for abdominal pain, diarrhea and constipation.  Genitourinary: Negative for dysuria and difficulty urinating.   Urinary incontinence   Musculoskeletal: Positive for arthralgias. Negative for myalgias.       Pain in legs  Skin: Negative for color change and wound.  Neurological: Negative for dizziness and weakness.  Psychiatric/Behavioral: Positive for confusion (notes memory loss). Negative for agitation. The patient is nervous/anxious (at times).     Past Medical History  Diagnosis Date  . Atrophy of vulva   . Dysuria   . Hyperpotassemia   . Unspecified hereditary and idiopathic peripheral neuropathy   . Osteoporosis, unspecified   . Hypertension   . Vitamin deficiency   . Chronic kidney disease   . Dermatophytosis of nail   . Other diseases of larynx   . Other B-complex deficiencies   . Mild cognitive impairment, so stated   . Other voice and resonance disorders   . Depression   . Hyperlipidemia   . Carotid stenosis   . Unspecified urinary incontinence   . Nephrolithiasis   . Macular degeneration     "left eye; growth behind that" (01/06/2014)  . GERD (gastroesophageal reflux disease)   . Reflux esophagitis   . DVT (deep venous thrombosis) ([place]) 01/06/2014    RLE  . Seizures ([place])     "one, years ago" (01/06/2014)  . Unspecified malignant neoplasm of skin, site unspecified     left foot   Past Surgical History  Procedure Laterality Date  . Stomach surgery  ~ 1960    "had ulcers for years; left scar tissue; did OR" (01/06/2014)  .  Excisional hemorrhoidectomy  1970's?  . Carotid endarterectomy Right 2006  . Vaginal hysterectomy  ~ 1980  . Dilation and curettage of uterus    . Cataract extraction w/ intraocular lens  implant, bilateral Bilateral 1990's   Social History:   reports that she quit smoking about 3 years ago. Her smoking use included Cigarettes. She has a 30 pack-year smoking history. She has never used smokeless tobacco. She reports that she does not drink alcohol or use illicit drugs.  Family History  Problem Relation Age of Onset  . Heart disease Mother 6  . Kidney  disease Father   . Diabetes Sister   . Heart disease Brother 72  . Heart attack Son 55  . Cancer Son     Throat  . Stroke Brother   . Cancer Brother   . Cancer Sister     Kidney    Medications: Patient's Medications  New Prescriptions   No medications on file  Previous Medications   AMITRIPTYLINE (ELAVIL) 50 MG TABLET    TAKE 1 TABLET BY MOUTH AT BEDTIME   AMLODIPINE (NORVASC) 10 MG TABLET    TAKE 1 TABLET BY MOUTH EVERY DAY   ANTISEPTIC ORAL RINSE (BIOTENE) LIQD    1 application by Mouth Rinse route every 4 (four) hours as needed for dry mouth.   CALCIUM CARBONATE (OS-CAL) 600 MG TABS    Take 600 mg by mouth 2 (two) times daily with a meal.    LEVOTHYROXINE (SYNTHROID, LEVOTHROID) 25 MCG TABLET    TAKE 1 TABLET (25 MCG TOTAL) BY MOUTH DAILY BEFORE BREAKFAST.   MELATONIN 3 MG TABS    Take 1 tablet (3 mg total) by mouth daily.   METOPROLOL SUCCINATE (TOPROL-XL) 25 MG 24 HR TABLET    TAKE 1 TABLET BY MOUTH EVERY DAY FOR BLOOD PRESSURE   OMEPRAZOLE (PRILOSEC) 20 MG CAPSULE    TAKE 1 CAPSULE (20 MG TOTAL) BY MOUTH DAILY. FOR ACID REFLUX   PENTAZOCINE-NALOXONE (TALWIN NX) 50-0.5 MG TABLET    TAKE 1 TABLET BY MOUTH EVERY 8 HOURS AS NEEDED   POLYETHYL GLYCOL-PROPYL GLYCOL (SYSTANE) 0.4-0.3 % SOLN    Apply 1 drop to eye daily as needed (for dry eyes).   SIMVASTATIN (ZOCOR) 20 MG TABLET    TAKE 1 TABLET (20 MG TOTAL) BY MOUTH DAILY.  Modified Medications   No medications on file  Discontinued Medications   No medications on file     Physical Exam:  Filed Vitals:   10/05/15 1359  BP: 124/78  Pulse: 60  Temp: 98.4 F (36.9 C)  TempSrc: Oral  Resp: 12  Height: '5\' 1"'$  (1.549 m)  Weight: 160 lb (72.576 kg)  SpO2: 94%   Body mass index is 30.25 kg/(m^2).  Physical Exam  Constitutional: She is oriented to person, place, and time. She appears well-developed and well-nourished. No distress.  HENT:  Head: Normocephalic and atraumatic.  Neck: Normal range of motion. Neck supple.    Cardiovascular: Normal rate, regular rhythm and normal heart sounds.   Pulmonary/Chest: Effort normal and breath sounds normal.  Abdominal: Soft. Bowel sounds are normal.  Musculoskeletal: She exhibits edema (left leg with 2+ and no edema to right). She exhibits no tenderness.  Neurological: She is alert and oriented to person, place, and time.  Skin: Skin is warm and dry. She is not diaphoretic.  Psychiatric: She has a normal mood and affect.    Labs reviewed: Basic Metabolic Panel:  Recent Labs  12/08/14 1108 04/15/15 1238  NA  --  142  K  --  4.6  CL  --  102  CO2  --  23  GLUCOSE  --  100*  BUN  --  15  CREATININE  --  1.13*  CALCIUM  --  8.9  TSH 3.140 4.280   Liver Function Tests:  Recent Labs  04/15/15 1238  AST 18  ALT 13  ALKPHOS 71  BILITOT 0.2  PROT 6.7  ALBUMIN 4.3   No results for input(s): LIPASE, AMYLASE in the last 8760 hours. No results for input(s): AMMONIA in the last 8760 hours. CBC:  Recent Labs  04/15/15 1238  WBC 8.6  NEUTROABS 5.9  HCT 33.5*   Lipid Panel:  Recent Labs  04/15/15 1238  CHOL 168  HDL 55  LDLCALC 70  TRIG 217*  CHOLHDL 3.1   TSH:  Recent Labs  12/08/14 1108 04/15/15 1238  TSH 3.140 4.280   A1C: No results found for: HGBA1C   Assessment/Plan 1. Encounter for immunization Influenza vaccine given today  2. Essential hypertension -blood pressure controlled, conts on norvasc and Toprol daily   3. Gastroesophageal reflux disease, esophagitis presence not specified Stable, conts on omprazole 20 mg dialy   4. Mild cognitive impairment Stable, lives alone with nieces help.   5. Chronic kidney disease, stage III (moderate) -avoid NSAIDs and dehydration  - CBC with Differential - Basic metabolic panel  6. Leg edema, left Chronic, encouraged elevation, compression hose and low sodium diet.   7. Anxiety state -not an issue everyday but will sometimes get overwhelmed and anxious.  - will give  ALPRAZolam (XANAX) 0.25 MG tablet; Take 1 tablet (0.25 mg total) by mouth daily as needed for anxiety.  Dispense: 20 tablet; Refill: 0 to take as needed for anxiety, if pt requiring medication more than weekly to consider daily medication due to risk and side effects of xanax  Follow up in 6 months for routine follow up   [place]. Harle Battiest  [facility] [phone] 8 am - 5 pm) [phone] (after hours)

## 2015-10-06 LAB — CBC WITH DIFFERENTIAL/PLATELET
Basophils Absolute: 0 10*3/uL (ref 0.0–0.2)
Basos: 0 %
EOS (ABSOLUTE): 0.1 10*3/uL (ref 0.0–0.4)
EOS: 2 %
HEMATOCRIT: 31.6 % — AB (ref 34.0–46.6)
Hemoglobin: 10.1 g/dL — ABNORMAL LOW (ref 11.1–15.9)
Immature Grans (Abs): 0 10*3/uL (ref 0.0–0.1)
Immature Granulocytes: 1 %
LYMPHS ABS: 2.1 10*3/uL (ref 0.7–3.1)
Lymphs: 26 %
MCH: 26.6 pg (ref 26.6–33.0)
MCHC: 32 g/dL (ref 31.5–35.7)
MCV: 83 fL (ref 79–97)
MONOS ABS: 0.5 10*3/uL (ref 0.1–0.9)
Monocytes: 6 %
Neutrophils Absolute: 5.3 10*3/uL (ref 1.4–7.0)
Neutrophils: 65 %
Platelets: 167 10*3/uL (ref 150–379)
RBC: 3.8 x10E6/uL (ref 3.77–5.28)
RDW: 16 % — AB (ref 12.3–15.4)
WBC: 7.9 10*3/uL (ref 3.4–10.8)

## 2015-10-06 LAB — BASIC METABOLIC PANEL
BUN / CREAT RATIO: 15 (ref 11–26)
BUN: 20 mg/dL (ref 8–27)
CO2: 26 mmol/L (ref 18–29)
Calcium: 9.2 mg/dL (ref 8.7–10.3)
Chloride: 103 mmol/L (ref 97–106)
Creatinine, Ser: 1.31 mg/dL — ABNORMAL HIGH (ref 0.57–1.00)
GFR, EST AFRICAN AMERICAN: 43 mL/min/{1.73_m2} — AB (ref >59)
GFR, EST NON AFRICAN AMERICAN: 37 mL/min/{1.73_m2} — AB (ref >59)
Glucose: 96 mg/dL (ref 65–99)
POTASSIUM: 4.6 mmol/L (ref 3.5–5.2)
SODIUM: 142 mmol/L (ref 136–144)

## 2015-10-13 ENCOUNTER — Ambulatory Visit (INDEPENDENT_AMBULATORY_CARE_PROVIDER_SITE_OTHER): Payer: Medicare Other | Admitting: Ophthalmology

## 2015-10-13 DIAGNOSIS — H43813 Vitreous degeneration, bilateral: Secondary | ICD-10-CM | POA: Diagnosis not present

## 2015-10-13 DIAGNOSIS — H353132 Nonexudative age-related macular degeneration, bilateral, intermediate dry stage: Secondary | ICD-10-CM | POA: Diagnosis not present

## 2015-10-13 DIAGNOSIS — H35033 Hypertensive retinopathy, bilateral: Secondary | ICD-10-CM | POA: Diagnosis not present

## 2015-10-13 DIAGNOSIS — I10 Essential (primary) hypertension: Secondary | ICD-10-CM | POA: Diagnosis not present

## 2015-10-13 DIAGNOSIS — H35372 Puckering of macula, left eye: Secondary | ICD-10-CM | POA: Diagnosis not present

## 2015-10-25 ENCOUNTER — Other Ambulatory Visit: Payer: Self-pay | Admitting: Internal Medicine

## 2015-10-25 ENCOUNTER — Other Ambulatory Visit: Payer: Self-pay | Admitting: *Deleted

## 2015-10-25 MED ORDER — PENTAZOCINE-NALOXONE HCL 50-0.5 MG PO TABS: ORAL_TABLET | ORAL | Status: DC

## 2015-10-25 NOTE — Telephone Encounter (Signed)
[facility] [address] 

## 2015-11-22 ENCOUNTER — Other Ambulatory Visit: Payer: Self-pay | Admitting: *Deleted

## 2015-11-22 ENCOUNTER — Other Ambulatory Visit: Payer: Self-pay | Admitting: Internal Medicine

## 2015-11-22 MED ORDER — PENTAZOCINE-NALOXONE HCL 50-0.5 MG PO TABS: 1.0000 | ORAL_TABLET | Freq: Three times a day (TID) | ORAL | Status: DC | PRN

## 2015-11-24 ENCOUNTER — Other Ambulatory Visit: Payer: Self-pay | Admitting: Internal Medicine

## 2015-12-08 ENCOUNTER — Telehealth: Payer: Self-pay | Admitting: *Deleted

## 2015-12-08 NOTE — Telephone Encounter (Signed)
Patient called and stated that her insurance [employer] will no longer cover patient's  Pentaz/Nalox '50mg'$  and she needs it to be changed to something else, patient not aware of alternative. Please Advise.

## 2015-12-13 NOTE — Telephone Encounter (Signed)
Will discuss at OV

## 2015-12-14 ENCOUNTER — Encounter: Payer: Self-pay | Admitting: Nurse Practitioner

## 2015-12-14 ENCOUNTER — Ambulatory Visit (INDEPENDENT_AMBULATORY_CARE_PROVIDER_SITE_OTHER): Payer: Medicare Other | Admitting: Nurse Practitioner

## 2015-12-14 VITALS — BP 112/62 | HR 78 | Temp 98.0°F | Resp 20 | Ht 61.0 in | Wt 163.2 lb

## 2015-12-14 DIAGNOSIS — K589 Irritable bowel syndrome without diarrhea: Secondary | ICD-10-CM | POA: Diagnosis not present

## 2015-12-14 DIAGNOSIS — F5105 Insomnia due to other mental disorder: Secondary | ICD-10-CM

## 2015-12-14 DIAGNOSIS — G609 Hereditary and idiopathic neuropathy, unspecified: Secondary | ICD-10-CM

## 2015-12-14 DIAGNOSIS — F419 Anxiety disorder, unspecified: Secondary | ICD-10-CM

## 2015-12-14 MED ORDER — TRAMADOL HCL 50 MG PO TABS: 50.0000 mg | ORAL_TABLET | Freq: Four times a day (QID) | ORAL | Status: DC | PRN

## 2015-12-14 MED ORDER — PREGABALIN 50 MG PO CAPS: 50.0000 mg | ORAL_CAPSULE | Freq: Three times a day (TID) | ORAL | Status: DC

## 2015-12-14 NOTE — Patient Instructions (Addendum)
To use miralax 17 gms daily for 3 days (or may use daily until good bowel movement) then use every other days for constipation   STOP pentazocine-naloxone and START lyrica 50 mg by mouth three times daily   Follow up with Dr Nyoka Cowden in 4 weeks for constipation and pain.

## 2015-12-14 NOTE — Progress Notes (Signed)
Patient ID: Brittany Hughes, female   DOB: [birth year], 82 y.o.   MRN: [id number]    PCP: Lauree Chandler, NP  Advanced Directive information Does patient have an advance directive?: Yes, Type of Advance Directive: Living will, Does patient want to make changes to advanced directive?: No - Patient declined  Allergies  Allergen Reactions  . Codeine Other (See Comments)    Possibly crazy actions    Chief Complaint  Patient presents with  . Medical Management of Chronic Issues    Has medication concerns  . OTHER    Friend in room with patient     HPI: Patient is a 82 y.o. female seen in the office today due to constipation. Pt was having diarrhea and constipation.  Taking fiber twice daily mixed with water, diarrhea has improve but now having constipation.  Not taking anything else for bowels having to disimpact herself. No abdominal pain. Passing gas.  Insurance has changed and talwin is not covered. Pt is taking medication due to pain in legs and arms. Needs another medication. Reports allergy codeine but unsure or reaction, possible sick to her stomach. Pt reports neuropathy to legs. Unable to describe the pain, just reports it hurts. Requiring medication most days.   Reports she has been taking amitriptyline for many years due to nerves and to help her sleep at night. Needing this to help her sleep.     Review of Systems:  Review of Systems  Constitutional: Negative for activity change, appetite change, fatigue and unexpected weight change.  HENT: Negative for congestion and hearing loss.   Eyes: Negative.   Respiratory: Negative for cough and shortness of breath.   Cardiovascular: Negative for chest pain, palpitations and leg swelling.  Gastrointestinal: Positive for constipation and rectal pain (due to constipation). Negative for abdominal pain and diarrhea.  Genitourinary: Negative for dysuria and difficulty urinating.       Urinary incontinence   Musculoskeletal:  Positive for myalgias and arthralgias.       Pain in legs  Skin: Negative for color change and wound.  Neurological: Negative for dizziness and weakness.  Psychiatric/Behavioral: Positive for confusion (notes memory loss). Negative for agitation. The patient is nervous/anxious (at times).     Past Medical History  Diagnosis Date  . Atrophy of vulva   . Dysuria   . Hyperpotassemia   . Unspecified hereditary and idiopathic peripheral neuropathy   . Osteoporosis, unspecified   . Hypertension   . Vitamin deficiency   . Chronic kidney disease   . Dermatophytosis of nail   . Other diseases of larynx   . Other B-complex deficiencies   . Mild cognitive impairment, so stated   . Other voice and resonance disorders   . Depression   . Hyperlipidemia   . Carotid stenosis   . Unspecified urinary incontinence   . Nephrolithiasis   . Macular degeneration     "left eye; growth behind that" (01/06/2014)  . GERD (gastroesophageal reflux disease)   . Reflux esophagitis   . DVT (deep venous thrombosis) ([place]) 01/06/2014    RLE  . Seizures ([place])     "one, years ago" (01/06/2014)  . Unspecified malignant neoplasm of skin, site unspecified     left foot   Past Surgical History  Procedure Laterality Date  . Stomach surgery  ~ 1960    "had ulcers for years; left scar tissue; did OR" (01/06/2014)  . Excisional hemorrhoidectomy  1970's?  . Carotid endarterectomy Right 2006  . Vaginal  hysterectomy  ~ 1980  . Dilation and curettage of uterus    . Cataract extraction w/ intraocular lens  implant, bilateral Bilateral 1990's   Social History:   reports that she quit smoking about 4 years ago. Her smoking use included Cigarettes. She has a 30 pack-year smoking history. She has never used smokeless tobacco. She reports that she does not drink alcohol or use illicit drugs.  Family History  Problem Relation Age of Onset  . Heart disease Mother 48  . Kidney disease Father   . Diabetes Sister   . Heart  disease Brother 69  . Heart attack Son 74  . Cancer Son     Throat  . Stroke Brother   . Cancer Brother   . Cancer Sister     Kidney    Medications: Patient's Medications  New Prescriptions   No medications on file  Previous Medications   ALPRAZOLAM (XANAX) 0.25 MG TABLET    Take 1 tablet (0.25 mg total) by mouth daily as needed for anxiety.   AMITRIPTYLINE (ELAVIL) 50 MG TABLET    TAKE 1 TABLET BY MOUTH AT BEDTIME   AMLODIPINE (NORVASC) 10 MG TABLET    TAKE 1 TABLET BY MOUTH EVERY DAY   ANTISEPTIC ORAL RINSE (BIOTENE) LIQD    1 application by Mouth Rinse route every 4 (four) hours as needed for dry mouth.   CALCIUM CARBONATE (OS-CAL) 600 MG TABS    Take 600 mg by mouth 2 (two) times daily with a meal.    LEVOTHYROXINE (SYNTHROID, LEVOTHROID) 25 MCG TABLET    TAKE 1 TABLET (25 MCG TOTAL) BY MOUTH DAILY BEFORE BREAKFAST.   MELATONIN 3 MG TABS    Take 1 tablet (3 mg total) by mouth daily.   METOPROLOL SUCCINATE (TOPROL-XL) 25 MG 24 HR TABLET    TAKE 1 TABLET BY MOUTH EVERY DAY FOR BLOOD PRESSURE   OMEPRAZOLE (PRILOSEC) 20 MG CAPSULE    TAKE 1 CAPSULE (20 MG TOTAL) BY MOUTH DAILY. FOR ACID REFLUX   PENTAZOCINE-NALOXONE (TALWIN NX) 50-0.5 MG TABLET    Take 1 tablet by mouth every 8 (eight) hours as needed.   POLYETHYL GLYCOL-PROPYL GLYCOL (SYSTANE) 0.4-0.3 % SOLN    Apply 1 drop to eye daily as needed (for dry eyes).   SIMVASTATIN (ZOCOR) 20 MG TABLET    TAKE 1 TABLET (20 MG TOTAL) BY MOUTH DAILY.  Modified Medications   No medications on file  Discontinued Medications   No medications on file     Physical Exam:  Filed Vitals:   12/14/15 1434  BP: 112/62  Pulse: 78  Temp: 98 F (36.7 C)  TempSrc: Oral  Resp: 20  Height: '5\' 1"'$  (1.549 m)  Weight: 163 lb 3.2 oz (74.027 kg)  SpO2: 97%   Body mass index is 30.85 kg/(m^2).  Physical Exam  Constitutional: She is oriented to person, place, and time. She appears well-developed and well-nourished. No distress.  HENT:  Head:  Normocephalic and atraumatic.  Mouth/Throat: Oropharynx is clear and moist. No oropharyngeal exudate.  Eyes: Conjunctivae are normal. Pupils are equal, round, and reactive to light.  Neck: Normal range of motion. Neck supple.  Cardiovascular: Normal rate, regular rhythm and normal heart sounds.   Pulmonary/Chest: Effort normal and breath sounds normal.  Abdominal: Soft. Bowel sounds are normal. She exhibits no distension. There is no tenderness. There is no rebound and no guarding.  Musculoskeletal: She exhibits edema (left leg with worsening edema, no edema on right). She exhibits  no tenderness.  Neurological: She is alert and oriented to person, place, and time.  Skin: Skin is warm and dry. She is not diaphoretic.  Psychiatric: Her mood appears anxious. Her speech is rapid and/or pressured.    Labs reviewed: Basic Metabolic Panel:  Recent Labs  04/15/15 1238 10/05/15 1504  NA 142 142  K 4.6 4.6  CL 102 103  CO2 23 26  GLUCOSE 100* 96  BUN 15 20  CREATININE 1.13* 1.31*  CALCIUM 8.9 9.2  TSH 4.280  --    Liver Function Tests:  Recent Labs  04/15/15 1238  AST 18  ALT 13  ALKPHOS 71  BILITOT 0.2  PROT 6.7  ALBUMIN 4.3   No results for input(s): LIPASE, AMYLASE in the last 8760 hours. No results for input(s): AMMONIA in the last 8760 hours. CBC:  Recent Labs  04/15/15 1238 10/05/15 1504  WBC 8.6 7.9  NEUTROABS 5.9 5.3  HCT 33.5* 31.6*  MCV 88 83  PLT 173 167   Lipid Panel:  Recent Labs  04/15/15 1238  CHOL 168  HDL 55  LDLCALC 70  TRIG 217*  CHOLHDL 3.1   TSH:  Recent Labs  04/15/15 1238  TSH 4.280   A1C: No results found for: HGBA1C   Assessment/Plan 1. IBS (irritable bowel syndrome) Diarrhea improved and now with constipation -to take miralax 17 gm daily for 3 days or until adequate BM then reduce to every other day  2. Hereditary and idiopathic peripheral neuropathy -pt to stop talwin nx and will start lyrica 50 mg by mouth three  times daily for pain, to notify it unable to tolerate medication due to side effects or pain not being controlled  3. Insomnia secondary to anxiety Taking amitriptyline due to sleep and "nerves" reports she was placed on this after her husband died and has titrated down to current dose. Does not wish to change dosing or stop medication at this time.   Follow up in 1 month with Dr Nyoka Cowden on pain and constipation.   [place]. Harle Battiest  [facility] [phone] 8 am - 5 pm) [phone] (after hours)

## 2015-12-15 ENCOUNTER — Telehealth: Payer: Self-pay | Admitting: *Deleted

## 2015-12-15 MED ORDER — GABAPENTIN 100 MG PO CAPS: ORAL_CAPSULE | ORAL | Status: DC

## 2015-12-15 NOTE — Telephone Encounter (Signed)
Okay to dc lyrica and start gabapentin 100 mg every 8 hours for pain

## 2015-12-15 NOTE — Telephone Encounter (Signed)
[facility]. Updated medication list.

## 2015-12-15 NOTE — Telephone Encounter (Signed)
Brittany Hughes with [facility] called and stated that patient's Lyrica '50mg'$  cost too much $202.55 and needs an alternative. Pharmacist suggests Gabapentin. Please Advise with new medication and directions.

## 2015-12-16 ENCOUNTER — Telehealth: Payer: Self-pay | Admitting: Gastroenterology

## 2015-12-17 NOTE — Telephone Encounter (Signed)
Pt has not had a bowel movement in 1 week, she is taking miralax three times daily since Tuesday.  The niece is unsure if she is passing gas.  She is having abd pain and cramping.  Has some rectal bleeding with hemorrhoids. No appts with Dr Ardis Hughs is available until April and first available with extender is 01/05/16.  Please advise

## 2015-12-20 ENCOUNTER — Other Ambulatory Visit: Payer: Self-pay | Admitting: Nurse Practitioner

## 2015-12-20 ENCOUNTER — Emergency Department (HOSPITAL_COMMUNITY)
Admission: EM | Admit: 2015-12-20 | Discharge: 2015-12-21 | Disposition: A | Discharge status: Home / Self Care | Payer: Medicare Other | Attending: Emergency Medicine | Admitting: Emergency Medicine

## 2015-12-20 ENCOUNTER — Emergency Department (HOSPITAL_COMMUNITY): Payer: Medicare Other

## 2015-12-20 ENCOUNTER — Encounter (HOSPITAL_COMMUNITY): Payer: Self-pay | Admitting: Emergency Medicine

## 2015-12-20 DIAGNOSIS — Z8709 Personal history of other diseases of the respiratory system: Secondary | ICD-10-CM | POA: Insufficient documentation

## 2015-12-20 DIAGNOSIS — K5900 Constipation, unspecified: Secondary | ICD-10-CM | POA: Diagnosis not present

## 2015-12-20 DIAGNOSIS — F329 Major depressive disorder, single episode, unspecified: Secondary | ICD-10-CM | POA: Insufficient documentation

## 2015-12-20 DIAGNOSIS — E785 Hyperlipidemia, unspecified: Secondary | ICD-10-CM | POA: Diagnosis not present

## 2015-12-20 DIAGNOSIS — K644 Residual hemorrhoidal skin tags: Secondary | ICD-10-CM | POA: Insufficient documentation

## 2015-12-20 DIAGNOSIS — Z87891 Personal history of nicotine dependence: Secondary | ICD-10-CM | POA: Diagnosis not present

## 2015-12-20 DIAGNOSIS — M810 Age-related osteoporosis without current pathological fracture: Secondary | ICD-10-CM | POA: Diagnosis not present

## 2015-12-20 DIAGNOSIS — Z8742 Personal history of other diseases of the female genital tract: Secondary | ICD-10-CM | POA: Insufficient documentation

## 2015-12-20 DIAGNOSIS — R1084 Generalized abdominal pain: Secondary | ICD-10-CM | POA: Diagnosis not present

## 2015-12-20 DIAGNOSIS — Z86718 Personal history of other venous thrombosis and embolism: Secondary | ICD-10-CM | POA: Diagnosis not present

## 2015-12-20 DIAGNOSIS — R140 Abdominal distension (gaseous): Secondary | ICD-10-CM | POA: Diagnosis not present

## 2015-12-20 DIAGNOSIS — Z85828 Personal history of other malignant neoplasm of skin: Secondary | ICD-10-CM | POA: Insufficient documentation

## 2015-12-20 DIAGNOSIS — G40909 Epilepsy, unspecified, not intractable, without status epilepticus: Secondary | ICD-10-CM | POA: Insufficient documentation

## 2015-12-20 DIAGNOSIS — Z87442 Personal history of urinary calculi: Secondary | ICD-10-CM | POA: Insufficient documentation

## 2015-12-20 DIAGNOSIS — Z79899 Other long term (current) drug therapy: Secondary | ICD-10-CM | POA: Diagnosis not present

## 2015-12-20 DIAGNOSIS — I129 Hypertensive chronic kidney disease with stage 1 through stage 4 chronic kidney disease, or unspecified chronic kidney disease: Secondary | ICD-10-CM | POA: Diagnosis not present

## 2015-12-20 DIAGNOSIS — K5641 Fecal impaction: Secondary | ICD-10-CM

## 2015-12-20 DIAGNOSIS — K589 Irritable bowel syndrome without diarrhea: Secondary | ICD-10-CM | POA: Diagnosis not present

## 2015-12-20 DIAGNOSIS — Z8619 Personal history of other infectious and parasitic diseases: Secondary | ICD-10-CM | POA: Diagnosis not present

## 2015-12-20 DIAGNOSIS — I10 Essential (primary) hypertension: Secondary | ICD-10-CM | POA: Diagnosis not present

## 2015-12-20 DIAGNOSIS — N189 Chronic kidney disease, unspecified: Secondary | ICD-10-CM | POA: Insufficient documentation

## 2015-12-20 DIAGNOSIS — K219 Gastro-esophageal reflux disease without esophagitis: Secondary | ICD-10-CM | POA: Insufficient documentation

## 2015-12-20 DIAGNOSIS — K5909 Other constipation: Secondary | ICD-10-CM | POA: Diagnosis not present

## 2015-12-20 LAB — CBC WITH DIFFERENTIAL/PLATELET
BASOS ABS: 0 10*3/uL (ref 0.0–0.1)
Basophils Relative: 0 %
EOS ABS: 0.1 10*3/uL (ref 0.0–0.7)
EOS PCT: 1 %
HCT: 32.9 % — ABNORMAL LOW (ref 36.0–46.0)
Hemoglobin: 10.2 g/dL — ABNORMAL LOW (ref 12.0–15.0)
Lymphocytes Relative: 22 %
Lymphs Abs: 2.6 10*3/uL (ref 0.7–4.0)
MCH: 26 pg (ref 26.0–34.0)
MCHC: 31 g/dL (ref 30.0–36.0)
MCV: 83.9 fL (ref 78.0–100.0)
Monocytes Absolute: 1 10*3/uL (ref 0.1–1.0)
Monocytes Relative: 8 %
Neutro Abs: 8.1 10*3/uL — ABNORMAL HIGH (ref 1.7–7.7)
Neutrophils Relative %: 69 %
PLATELETS: 196 10*3/uL (ref 150–400)
RBC: 3.92 MIL/uL (ref 3.87–5.11)
RDW: 15.6 % — ABNORMAL HIGH (ref 11.5–15.5)
WBC: 11.8 10*3/uL — AB (ref 4.0–10.5)

## 2015-12-20 LAB — COMPREHENSIVE METABOLIC PANEL
ALT: 13 U/L — AB (ref 14–54)
AST: 22 U/L (ref 15–41)
Albumin: 3.9 g/dL (ref 3.5–5.0)
Alkaline Phosphatase: 70 U/L (ref 38–126)
Anion gap: 12 (ref 5–15)
BUN: 16 mg/dL (ref 6–20)
CHLORIDE: 102 mmol/L (ref 101–111)
CO2: 24 mmol/L (ref 22–32)
CREATININE: 1.66 mg/dL — AB (ref 0.44–1.00)
Calcium: 9.3 mg/dL (ref 8.9–10.3)
GFR calc non Af Amer: 27 mL/min — ABNORMAL LOW (ref >60)
GFR, EST AFRICAN AMERICAN: 31 mL/min — AB (ref >60)
Glucose, Bld: 138 mg/dL — ABNORMAL HIGH (ref 65–99)
POTASSIUM: 3.9 mmol/L (ref 3.5–5.1)
SODIUM: 138 mmol/L (ref 135–145)
Total Bilirubin: 0.4 mg/dL (ref 0.3–1.2)
Total Protein: 7 g/dL (ref 6.5–8.1)

## 2015-12-20 LAB — LIPASE, BLOOD: Lipase: 18 U/L (ref 11–51)

## 2015-12-20 LAB — POC OCCULT BLOOD, ED: FECAL OCCULT BLD: POSITIVE — AB

## 2015-12-20 MED ORDER — BARIUM SULFATE 2.1 % PO SUSP
ORAL | Status: AC
  Administered 2015-12-20: 2 mL
  Filled 2015-12-20: qty 2

## 2015-12-20 MED ORDER — MINERAL OIL RE ENEM
1.0000 | ENEMA | Freq: Once | RECTAL | Status: AC
  Administered 2015-12-20: 1 via RECTAL
  Filled 2015-12-20: qty 1

## 2015-12-20 MED ORDER — DOCUSATE SODIUM 100 MG PO CAPS: 100.0000 mg | ORAL_CAPSULE | Freq: Two times a day (BID) | ORAL | Status: DC

## 2015-12-20 MED ORDER — LACTULOSE 10 GM/15ML PO SOLN
30.0000 g | Freq: Once | ORAL | Status: AC
  Administered 2015-12-21: 30 g via ORAL
  Filled 2015-12-20: qty 45

## 2015-12-20 MED ORDER — FENTANYL CITRATE (PF) 100 MCG/2ML IJ SOLN
25.0000 ug | Freq: Once | INTRAMUSCULAR | Status: AC
  Administered 2015-12-20: 25 ug via INTRAVENOUS
  Filled 2015-12-20: qty 2

## 2015-12-20 MED ORDER — FLEET ENEMA 7-19 GM/118ML RE ENEM
1.0000 | ENEMA | Freq: Once | RECTAL | Status: AC
  Administered 2015-12-20: 1 via RECTAL
  Filled 2015-12-20: qty 1

## 2015-12-20 MED ORDER — MAGNESIUM CITRATE PO SOLN
1.0000 | Freq: Once | ORAL | Status: AC
  Administered 2015-12-21: 1 via ORAL
  Filled 2015-12-20: qty 296

## 2015-12-20 MED ORDER — LACTULOSE 20 GM/30ML PO SOLN: 30.0000 mL | Freq: Two times a day (BID) | ORAL | Status: DC

## 2015-12-20 NOTE — Discharge Instructions (Signed)
Constipation, Adult Take the constipation medication as prescribed. Follow up with your doctor. Return to the ED if you develop worsening pain, vomiting, or any other concerns. Constipation is when a person has fewer than three bowel movements a week, has difficulty having a bowel movement, or has stools that are dry, hard, or larger than normal. As people grow older, constipation is more common. A low-fiber diet, not taking in enough fluids, and taking certain medicines may make constipation worse.  CAUSES   Certain medicines, such as antidepressants, pain medicine, iron supplements, antacids, and water pills.   Certain diseases, such as diabetes, irritable bowel syndrome (IBS), thyroid disease, or depression.   Not drinking enough water.   Not eating enough fiber-rich foods.   Stress or travel.   Lack of physical activity or exercise.   Ignoring the urge to have a bowel movement.   Using laxatives too much.  SIGNS AND SYMPTOMS   Having fewer than three bowel movements a week.   Straining to have a bowel movement.   Having stools that are hard, dry, or larger than normal.   Feeling full or bloated.   Pain in the lower abdomen.   Not feeling relief after having a bowel movement.  DIAGNOSIS  Your health care provider will take a medical history and perform a physical exam. Further testing may be done for severe constipation. Some tests may include:  A barium enema X-ray to examine your rectum, colon, and, sometimes, your small intestine.   A sigmoidoscopy to examine your lower colon.   A colonoscopy to examine your entire colon. TREATMENT  Treatment will depend on the severity of your constipation and what is causing it. Some dietary treatments include drinking more fluids and eating more fiber-rich foods. Lifestyle treatments may include regular exercise. If these diet and lifestyle recommendations do not help, your health care provider may recommend taking  over-the-counter laxative medicines to help you have bowel movements. Prescription medicines may be prescribed if over-the-counter medicines do not work.  HOME CARE INSTRUCTIONS   Eat foods that have a lot of fiber, such as fruits, vegetables, whole grains, and beans.  Limit foods high in fat and processed sugars, such as french fries, hamburgers, cookies, candies, and soda.   A fiber supplement may be added to your diet if you cannot get enough fiber from foods.   Drink enough fluids to keep your urine clear or pale yellow.   Exercise regularly or as directed by your health care provider.   Go to the restroom when you have the urge to go. Do not hold it.   Only take over-the-counter or prescription medicines as directed by your health care provider. Do not take other medicines for constipation without talking to your health care provider first.  [place] IF:   You have bright red blood in your stool.   Your constipation lasts for more than 4 days or gets worse.   You have abdominal or rectal pain.   You have thin, pencil-like stools.   You have unexplained weight loss. MAKE SURE YOU:   Understand these instructions.  Will watch your condition.  Will get help right away if you are not doing well or get worse.   This information is not intended to replace advice given to you by your health care provider. Make sure you discuss any questions you have with your health care provider.   Document Released: 07/21/2004 Document Revised: 11/13/2014 Document Reviewed: 08/04/2013 Elsevier Interactive Patient  Education ©2016 [employer] ° °

## 2015-12-20 NOTE — ED Notes (Signed)
Pt on bedside commode attempting BM

## 2015-12-20 NOTE — Telephone Encounter (Signed)
I think next available with extender sounds reasonable

## 2015-12-20 NOTE — Telephone Encounter (Signed)
Left message on machine to call back  

## 2015-12-20 NOTE — ED Notes (Signed)
Pt was sent from urgent care for a fecal impaction and constipation. Pt states last normal BM was 2 weeks ago. Pt c/o in rectum.

## 2015-12-20 NOTE — ED Provider Notes (Signed)
CSN: [id number]     Arrival date & time 12/20/15  1447 History   First MD Initiated Contact with Patient 12/20/15 1800     Chief Complaint  Patient presents with  . Fecal Impaction     (Consider location/radiation/quality/duration/timing/severity/associated sxs/prior Treatment) HPI Comments: Patient sent from urgent care with one week history of constipation. States history of IBS has been taking miralax 3 times daily for the past 6 days. States no bowel movement for more than 1 week. She is still passing gas. She has nausea but no vomiting. No fever. No chest pain or shortness of breath. Endorses some abdominal cramping. Still wants to eat. She was seen in urgent care and had an x-ray that reportedly showed a large amount of stool. She states she has a history of hemorrhoids and sometimes has to disimpact herself.  The history is provided by the patient.    Past Medical History  Diagnosis Date  . Atrophy of vulva   . Dysuria   . Hyperpotassemia   . Unspecified hereditary and idiopathic peripheral neuropathy   . Osteoporosis, unspecified   . Hypertension   . Vitamin deficiency   . Chronic kidney disease   . Dermatophytosis of nail   . Other diseases of larynx   . Other B-complex deficiencies   . Mild cognitive impairment, so stated   . Other voice and resonance disorders   . Depression   . Hyperlipidemia   . Carotid stenosis   . Unspecified urinary incontinence   . Nephrolithiasis   . Macular degeneration     "left eye; growth behind that" (01/06/2014)  . GERD (gastroesophageal reflux disease)   . Reflux esophagitis   . DVT (deep venous thrombosis) ([facility]) 01/06/2014    RLE  . Seizures ([place])     "one, years ago" (01/06/2014)  . Unspecified malignant neoplasm of skin, site unspecified     left foot   Past Surgical History  Procedure Laterality Date  . Stomach surgery  ~ 1960    "had ulcers for years; left scar tissue; did OR" (01/06/2014)  . Excisional hemorrhoidectomy  1970's?   . Carotid endarterectomy Right 2006  . Vaginal hysterectomy  ~ 1980  . Dilation and curettage of uterus    . Cataract extraction w/ intraocular lens  implant, bilateral Bilateral 1990's   Family History  Problem Relation Age of Onset  . Heart disease Mother 76  . Kidney disease Father   . Diabetes Sister   . Heart disease Brother 75  . Heart attack Son 49  . Cancer Son     Throat  . Stroke Brother   . Cancer Brother   . Cancer Sister     Kidney   Social History  Substance Use Topics  . Smoking status: Former Smoker -- 0.50 packs/day for 60 years    Types: Cigarettes    Quit date: 10/31/2011  . Smokeless tobacco: Never Used  . Alcohol Use: No   OB History    No data available     Review of Systems  Constitutional: Negative for fever, activity change and appetite change.  HENT: Negative for congestion and rhinorrhea.   Respiratory: Negative for cough, chest tightness and shortness of breath.   Cardiovascular: Negative for chest pain.  Gastrointestinal: Positive for nausea, abdominal pain, constipation, abdominal distention and rectal pain. Negative for vomiting and diarrhea.  Genitourinary: Negative for dysuria, hematuria, vaginal bleeding and vaginal discharge.  Musculoskeletal: Negative for myalgias and arthralgias.  Skin: Negative for rash.  Neurological: Negative for dizziness, weakness and headaches.    A complete 10 system review of systems was obtained and all systems are negative except as noted in the HPI and PMH.    Allergies  Codeine  Home Medications   Prior to Admission medications   Medication Sig Start Date End Date Taking? Authorizing Provider  ALPRAZolam Duanne Moron) 0.25 MG tablet TAKE 1 TABLET EVERY DAY AS NEEDED FOR ANXIETY 12/20/15   Tiffany L Reed, DO  amitriptyline (ELAVIL) 50 MG tablet TAKE 1 TABLET BY MOUTH AT BEDTIME 06/23/15   Estill Dooms, MD  amLODipine (NORVASC) 10 MG tablet TAKE 1 TABLET BY MOUTH EVERY DAY 03/31/15   Estill Dooms, MD   antiseptic oral rinse (BIOTENE) LIQD 1 application by Mouth Rinse route every 4 (four) hours as needed for dry mouth.    Historical Provider, MD  calcium carbonate (OS-CAL) 600 MG TABS Take 600 mg by mouth 2 (two) times daily with a meal.     Historical Provider, MD  docusate sodium (COLACE) 100 MG capsule Take 1 capsule (100 mg total) by mouth every 12 (twelve) hours. 12/20/15   Ezequiel Essex, MD  gabapentin (NEURONTIN) 100 MG capsule Take one capsule by mouth every 8 hours for pain 12/15/15   Lauree Chandler, NP  Lactulose 20 GM/30ML SOLN Take 30 mLs (20 g total) by mouth 2 (two) times daily. As needed for constipation 12/20/15   Ezequiel Essex, MD  levothyroxine (SYNTHROID, LEVOTHROID) 25 MCG tablet TAKE 1 TABLET (25 MCG TOTAL) BY MOUTH DAILY BEFORE BREAKFAST. 06/23/15   Estill Dooms, MD  Melatonin 3 MG TABS Take 1 tablet (3 mg total) by mouth daily. 09/29/14   Blanchie Serve, MD  metoprolol succinate (TOPROL-XL) 25 MG 24 hr tablet TAKE 1 TABLET BY MOUTH EVERY DAY FOR BLOOD PRESSURE 03/31/15   Estill Dooms, MD  omeprazole (PRILOSEC) 20 MG capsule TAKE 1 CAPSULE (20 MG TOTAL) BY MOUTH DAILY. FOR ACID REFLUX 06/23/15   Estill Dooms, MD  Polyethyl Glycol-Propyl Glycol (SYSTANE) 0.4-0.3 % SOLN Apply 1 drop to eye daily as needed (for dry eyes).    Historical Provider, MD  simvastatin (ZOCOR) 20 MG tablet TAKE 1 TABLET (20 MG TOTAL) BY MOUTH DAILY. 06/23/15   Estill Dooms, MD   BP 144/75 mmHg  Pulse 71  Temp(Src) 97.9 F (36.6 C) (Oral)  Resp 16  SpO2 100% Physical Exam  Constitutional: She is oriented to person, place, and time. She appears well-developed and well-nourished. No distress.  HENT:  Head: Normocephalic and atraumatic.  Mouth/Throat: Oropharynx is clear and moist. No oropharyngeal exudate.  Eyes: Conjunctivae and EOM are normal. Pupils are equal, round, and reactive to light.  Neck: Normal range of motion. Neck supple.  No meningismus.  Cardiovascular: Normal rate, regular  rhythm, normal heart sounds and intact distal pulses.   No murmur heard. Pulmonary/Chest: Effort normal and breath sounds normal. No respiratory distress.  Abdominal: Soft. She exhibits distension. There is tenderness. There is no rebound and no guarding.  Diffuse tender and distended.   Genitourinary:  Chaperone present. External hemorrhoid, no bleeding.  Soft stool in rectal vault, some evacuated with finger. More stool felt proximally past finger.  Musculoskeletal: Normal range of motion. She exhibits no edema or tenderness.  Neurological: She is alert and oriented to person, place, and time. No cranial nerve deficit. She exhibits normal muscle tone. Coordination normal.  No ataxia on finger to nose bilaterally. No pronator drift. 5/5 strength throughout.  CN 2-12 intact.Equal grip strength. Sensation intact.   Skin: Skin is warm.  Psychiatric: She has a normal mood and affect. Her behavior is normal.  Nursing note and vitals reviewed.   ED Course  Fecal disimpaction Date/Time: 12/20/2015 6:18 PM Performed by: Ezequiel Essex Authorized by: Ezequiel Essex Consent: Verbal consent obtained. Risks and benefits: risks, benefits and alternatives were discussed Consent given by: patient Patient understanding: patient states understanding of the procedure being performed Patient consent: the patient's understanding of the procedure matches consent given Patient identity confirmed: provided demographic data and verbally with patient Local anesthesia used: no Patient sedated: no Patient tolerance: Patient tolerated the procedure well with no immediate complications   (including critical care time) Labs Review Labs Reviewed  CBC WITH DIFFERENTIAL/PLATELET - Abnormal; Notable for the following:    WBC 11.8 (*)    Hemoglobin 10.2 (*)    HCT 32.9 (*)    RDW 15.6 (*)    Neutro Abs 8.1 (*)    All other components within normal limits  COMPREHENSIVE METABOLIC PANEL - Abnormal; Notable for  the following:    Glucose, Bld 138 (*)    Creatinine, Ser 1.66 (*)    ALT 13 (*)    GFR calc non Af Amer 27 (*)    GFR calc Af Amer 31 (*)    All other components within normal limits  POC OCCULT BLOOD, ED - Abnormal; Notable for the following:    Fecal Occult Bld POSITIVE (*)    All other components within normal limits  LIPASE, BLOOD  URINALYSIS, ROUTINE W REFLEX MICROSCOPIC (NOT AT [facility])    Imaging Review Ct Abdomen Pelvis Wo Contrast  12/20/2015  CLINICAL DATA:  Chronic constipation. Increasing abdominal pain and constipation for 2 weeks. EXAM: CT ABDOMEN AND PELVIS WITHOUT CONTRAST TECHNIQUE: Multidetector CT imaging of the abdomen and pelvis was performed following the standard protocol without IV contrast. COMPARISON:  None. FINDINGS: The lung bases are clear.  Postoperative changes at the EG junction. Evaluation of solid organs and vascular structures is limited without IV contrast material. The unenhanced appearance of the liver, spleen, gallbladder, adrenal glands, inferior vena cava, and retroperitoneal lymph nodes is unremarkable. There is prominent diffuse fatty infiltration of the pancreas. Small accessory spleen. Calcification of the abdominal aorta without aneurysm. 8 mm stone in the lower pole right kidney. No hydronephrosis or hydroureter. Sub cm hyperintense lesion in the left kidney probably representing a hemorrhagic cyst. Stomach, small bowel, and colon are not abnormally distended. Colon is diffusely filled with stool and gas. Contrast material flows through to the cecum without evidence of bowel obstruction. No bowel wall thickening is appreciated. No free air or free fluid in the abdomen. Abdominal wall musculature appears intact. Pelvis: Appendix is normal. Bladder wall is not thickened. Stool-filled rectosigmoid colon without evidence of diverticulitis. No free or loculated pelvic fluid collections. No pelvic mass or lymphadenopathy. Uterus appears surgically absent.  Degenerative changes in the lumbar spine. Mild lumbar scoliosis. Degenerative changes in the hips. IMPRESSION: No acute process demonstrated in the abdomen or pelvis. Diffusely stool-filled colon. No small or large bowel obstruction or inflammation suggested. Diffuse fatty infiltration of the pancreas. Nonobstructing stone in the right kidney. Electronically Signed   By: Lucienne Capers M.D.   On: 12/20/2015 22:37   I have personally reviewed and evaluated these images and lab results as part of my medical decision-making.   EKG Interpretation None      MDM   Final diagnoses:  Constipation,  unspecified constipation type  Fecal impaction ([facility])   Constipation with abdominal pain and fecal impaction. Some evacuated manually as above. Abdomen soft without peritoneal signs. We'll give enema.  Small BM after enema.  States "i have 25 pounds of stool in me". Abdomen soft. No obstruction on CT. Cr slightly worse, hemoglobin stable  Soap sud enema and mag citrate given in the ED.  Some more stool was manually disimpacted.  No further stool can be reached.   Abdomen soft, no vomiting.  Will give lactulose for home and continue enemas. followup with PCP and GI this week. Return precautions discussed. Will need recheck of creatinine by PCP.   Ezequiel Essex, MD 12/21/15 [phone]

## 2015-12-21 DIAGNOSIS — K5900 Constipation, unspecified: Secondary | ICD-10-CM | POA: Diagnosis not present

## 2015-12-21 NOTE — ED Notes (Signed)
75 mcg of fentanyl wasted with Kendrick Ranch RN

## 2015-12-21 NOTE — Telephone Encounter (Signed)
Pt was seen in the ED and was impacted, she is now doing well and will call back if she develops any further symptoms.  Follow up appt offered but she declined to set up until she talks with her niece.

## 2015-12-30 ENCOUNTER — Other Ambulatory Visit: Payer: Self-pay | Admitting: Internal Medicine

## 2015-12-31 ENCOUNTER — Other Ambulatory Visit: Payer: Self-pay | Admitting: Internal Medicine

## 2015-12-31 ENCOUNTER — Telehealth: Payer: Self-pay | Admitting: *Deleted

## 2015-12-31 NOTE — Telephone Encounter (Signed)
Patient notified and will keep appointment.

## 2015-12-31 NOTE — Telephone Encounter (Signed)
Medication was stopped because it was not on her formulary and changed to gabapentin. Pt is to follow up with Dr Nyoka Cowden March 8th

## 2015-12-31 NOTE — Telephone Encounter (Signed)
Patient called and requested a refill on Pentazocine Naloxone 50/.5 One every 8 hours as needed for pain. Medication is not in patient's current medication list. Is this ok to refill? Please Advise.

## 2016-01-12 ENCOUNTER — Ambulatory Visit (INDEPENDENT_AMBULATORY_CARE_PROVIDER_SITE_OTHER): Payer: Medicare Other | Admitting: Internal Medicine

## 2016-01-12 ENCOUNTER — Encounter: Payer: Self-pay | Admitting: Internal Medicine

## 2016-01-12 VITALS — BP 148/60 | HR 47 | Temp 98.0°F | Resp 20 | Ht 61.0 in | Wt 150.6 lb

## 2016-01-12 DIAGNOSIS — I10 Essential (primary) hypertension: Secondary | ICD-10-CM | POA: Diagnosis not present

## 2016-01-12 DIAGNOSIS — R110 Nausea: Secondary | ICD-10-CM | POA: Diagnosis not present

## 2016-01-12 DIAGNOSIS — G894 Chronic pain syndrome: Secondary | ICD-10-CM | POA: Diagnosis not present

## 2016-01-12 MED ORDER — PENTAZOCINE-NALOXONE HCL 50-0.5 MG PO TABS: ORAL_TABLET | ORAL | Status: DC

## 2016-01-12 MED ORDER — PROMETHAZINE HCL 25 MG PO TABS: ORAL_TABLET | ORAL | Status: DC

## 2016-01-12 NOTE — Progress Notes (Signed)
Patient ID: Brittany Hughes, female   DOB: 21-Nov-1927, 82 y.o.   MRN: [id number]    Facility  [facility]    Place of Service:   OFFICE    Allergies  Allergen Reactions  . Codeine Other (See Comments)    Possibly crazy actions  . Colace [Docusate Calcium] Nausea Only    Chief Complaint  Patient presents with  . Medical Management of Chronic Issues    4 week f/u IBS, impacted bowel,     HPI: Saw Dr. Ardis Hughs Having issues with constipation. Saw [facility]. Used Miralax and referred to GI. Was put on Colace and lactulose. Colace makes her nauseous. Stools are doing better.   Historically, has used pentazocine naloxone for pain in her legs and neck . Also had pain in the stomach. [facility] said it is not on their formulary. She is asking for something else or an appeal. She is allergic to codiene. She has failed on gabapentin. Tramadol was too expensive ($200)  Was told she had a blockage in the neck. Had carotid endarterectomy about 2007.  Continues with recurrent nausea.  Medications: Patient's Medications  New Prescriptions   No medications on file  Previous Medications   ALPRAZOLAM (XANAX) 0.25 MG TABLET    TAKE 1 TABLET EVERY DAY AS NEEDED FOR ANXIETY   AMITRIPTYLINE (ELAVIL) 50 MG TABLET    TAKE 1 TABLET BY MOUTH AT BEDTIME   AMLODIPINE (NORVASC) 10 MG TABLET    TAKE 1 TABLET BY MOUTH EVERY DAY   ANTISEPTIC ORAL RINSE (BIOTENE) LIQD    1 application by Mouth Rinse route every 4 (four) hours as needed for dry mouth.   CALCIUM CARBONATE (OS-CAL) 600 MG TABS    Take 600 mg by mouth 2 (two) times daily with a meal.    GABAPENTIN (NEURONTIN) 100 MG CAPSULE    Take one capsule by mouth every 8 hours for pain   LACTULOSE 20 GM/30ML SOLN    Take 30 mLs (20 g total) by mouth 2 (two) times daily. As needed for constipation   LEVOTHYROXINE (SYNTHROID, LEVOTHROID) 25 MCG TABLET    TAKE 1 TABLET (25 MCG TOTAL) BY MOUTH DAILY BEFORE BREAKFAST.   MELATONIN 3 MG TABS    Take 1 tablet (3 mg total) by  mouth daily.   METOPROLOL SUCCINATE (TOPROL-XL) 25 MG 24 HR TABLET    TAKE 1 TABLET BY MOUTH EVERY DAY FOR BLOOD PRESSURE   OMEPRAZOLE (PRILOSEC) 20 MG CAPSULE    TAKE 1 CAPSULE (20 MG TOTAL) BY MOUTH DAILY. FOR ACID REFLUX   POLYETHYL GLYCOL-PROPYL GLYCOL (SYSTANE) 0.4-0.3 % SOLN    Apply 1 drop to eye daily as needed (for dry eyes).   SIMVASTATIN (ZOCOR) 20 MG TABLET    TAKE 1 TABLET (20 MG TOTAL) BY MOUTH DAILY.  Modified Medications   No medications on file  Discontinued Medications   DOCUSATE SODIUM (COLACE) 100 MG CAPSULE    Take 1 capsule (100 mg total) by mouth every 12 (twelve) hours.    Review of Systems  Constitutional: Negative for activity change, appetite change, fatigue and unexpected weight change.  HENT: Negative for congestion and hearing loss.   Eyes: Negative.   Respiratory: Negative for cough and shortness of breath.   Cardiovascular: Negative for chest pain, palpitations and leg swelling.  Gastrointestinal: Positive for constipation and rectal pain (due to constipation). Negative for abdominal pain and diarrhea.  Genitourinary: Negative for dysuria and difficulty urinating.       Urinary  incontinence   Musculoskeletal: Positive for myalgias and arthralgias.       Pain in legs  Skin: Negative for color change and wound.  Neurological: Negative for dizziness and weakness.  Psychiatric/Behavioral: Positive for confusion (notes memory loss). Negative for agitation. The patient is nervous/anxious (at times).     Filed Vitals:   01/12/16 1354  BP: 148/60  Pulse: 47  Temp: 98 F (36.7 C)  TempSrc: Oral  Resp: 20  Height: 5' 1"  (1.549 m)  Weight: 150 lb 9.6 oz (68.312 kg)  SpO2: 98%   Body mass index is 28.47 kg/(m^2). Filed Weights   01/12/16 1354  Weight: 150 lb 9.6 oz (68.312 kg)     Physical Exam  Constitutional: She is oriented to person, place, and time. She appears well-developed and well-nourished. No distress.  HENT:  Head: Normocephalic and  atraumatic.  Mouth/Throat: Oropharynx is clear and moist. No oropharyngeal exudate.  Eyes: Conjunctivae are normal. Pupils are equal, round, and reactive to light.  Neck: Normal range of motion. Neck supple.  Cardiovascular: Normal rate, regular rhythm and normal heart sounds.   Pulmonary/Chest: Effort normal and breath sounds normal.  Abdominal: Soft. Bowel sounds are normal. She exhibits no distension. There is no tenderness. There is no rebound and no guarding.  Musculoskeletal: She exhibits edema (left leg with worsening edema, no edema on right). She exhibits no tenderness.  Neurological: She is alert and oriented to person, place, and time.  Skin: Skin is warm and dry. She is not diaphoretic.  Psychiatric: Her mood appears anxious. Her speech is rapid and/or pressured.    Labs reviewed: Lab Summary Latest Ref Rng 12/20/2015 10/05/2015 04/15/2015  Hemoglobin 12.0 - 15.0 g/dL 10.2(L) 10.1(L) 10.8(L)  Hematocrit 36.0 - 46.0 % 32.9(L) 31.6(L) 33.5(L)  White count 4.0 - 10.5 K/uL 11.8(H) 7.9 8.6  Platelet count 150 - 400 K/uL 196 167 173  Sodium 135 - 145 mmol/L 138 142 142  Potassium 3.5 - 5.1 mmol/L 3.9 4.6 4.6  Calcium 8.9 - 10.3 mg/dL 9.3 9.2 8.9  Phosphorus - (None) (None) (None)  Creatinine 0.44 - 1.00 mg/dL 1.66(H) 1.31(H) 1.13(H)  AST 15 - 41 U/L 22 (None) 18  Alk Phos 38 - 126 U/L 70 (None) 71  Bilirubin 0.3 - 1.2 mg/dL 0.4 (None) 0.2  Glucose 65 - 99 mg/dL 138(H) 96 100(H)  Cholesterol - (None) (None) (None)  HDL cholesterol >39 mg/dL (None) (None) 55  Triglycerides 0 - 149 mg/dL (None) (None) 217(H)  LDL Direct - (None) (None) (None)  LDL Calc 0 - 99 mg/dL (None) (None) 70  Total protein 6.5 - 8.1 g/dL 7.0 (None) (None)  Albumin 3.5 - 5.0 g/dL 3.9 (None) 4.3   Lab Results  Component Value Date   TSH 4.280 04/15/2015   TSH 3.140 12/08/2014   TSH 7.620* 09/29/2014   T3TOTAL 100 12/08/2014   T4TOTAL 5.7 09/29/2014   Lab Results  Component Value Date   BUN 16  12/20/2015   BUN 20 10/05/2015   BUN 15 04/15/2015   No results found for: HGBA1C  Assessment/Plan 1. Chronic pain syndrome Resume pentazocine/naloxone. This has controlled pains well in the past and she has failed on both gabapentin and tramadol.  2. Essential hypertension Controlled  3. Nausea without vomiting Phenergan 25 mg every 6 hours as needed for nausea

## 2016-01-13 ENCOUNTER — Encounter: Payer: Self-pay | Admitting: Internal Medicine

## 2016-01-14 ENCOUNTER — Telehealth: Payer: Self-pay

## 2016-01-14 NOTE — Telephone Encounter (Signed)
Left message informing [facility] on [address] that rx approved.

## 2016-01-14 NOTE — Telephone Encounter (Signed)
Letter received from [place] indicating approved coverage for Pentazocine-Naloxone Hcl  Effective 11/05/15- 11/05/16  Copy of letter placed on ledge for Dr.Green to sign then to scanning

## 2016-01-18 NOTE — Telephone Encounter (Signed)
Patient aware rx for [place] covered

## 2016-01-25 ENCOUNTER — Other Ambulatory Visit: Payer: Self-pay | Admitting: Internal Medicine

## 2016-02-14 ENCOUNTER — Other Ambulatory Visit: Payer: Self-pay | Admitting: Internal Medicine

## 2016-02-15 ENCOUNTER — Other Ambulatory Visit: Payer: Self-pay | Admitting: Internal Medicine

## 2016-02-23 DIAGNOSIS — N183 Chronic kidney disease, stage 3 (moderate): Secondary | ICD-10-CM | POA: Diagnosis not present

## 2016-02-23 DIAGNOSIS — D631 Anemia in chronic kidney disease: Secondary | ICD-10-CM | POA: Diagnosis not present

## 2016-02-23 DIAGNOSIS — N189 Chronic kidney disease, unspecified: Secondary | ICD-10-CM | POA: Diagnosis not present

## 2016-02-23 DIAGNOSIS — I129 Hypertensive chronic kidney disease with stage 1 through stage 4 chronic kidney disease, or unspecified chronic kidney disease: Secondary | ICD-10-CM | POA: Diagnosis not present

## 2016-02-23 DIAGNOSIS — N2581 Secondary hyperparathyroidism of renal origin: Secondary | ICD-10-CM | POA: Diagnosis not present

## 2016-03-07 ENCOUNTER — Other Ambulatory Visit: Payer: Self-pay | Admitting: Internal Medicine

## 2016-03-14 ENCOUNTER — Other Ambulatory Visit: Payer: Self-pay | Admitting: Internal Medicine

## 2016-04-10 ENCOUNTER — Other Ambulatory Visit: Payer: Self-pay | Admitting: Internal Medicine

## 2016-04-10 MED ORDER — PENTAZOCINE-NALOXONE HCL 50-0.5 MG PO TABS: ORAL_TABLET | ORAL | Status: DC

## 2016-04-10 NOTE — Telephone Encounter (Signed)
Pre print required due to wrong signing provider listed.

## 2016-04-10 NOTE — Addendum Note (Signed)
Addended by: Denyse Amass on: 04/10/2016 03:50 PM   Modules accepted: Orders

## 2016-04-18 ENCOUNTER — Ambulatory Visit: Payer: Medicare Other | Admitting: Nurse Practitioner

## 2016-04-18 ENCOUNTER — Encounter: Payer: Self-pay | Admitting: Internal Medicine

## 2016-04-18 ENCOUNTER — Ambulatory Visit (INDEPENDENT_AMBULATORY_CARE_PROVIDER_SITE_OTHER): Payer: Medicare Other | Admitting: Internal Medicine

## 2016-04-18 VITALS — BP 110/64 | HR 68 | Temp 97.7°F | Ht 61.0 in | Wt 154.0 lb

## 2016-04-18 DIAGNOSIS — R296 Repeated falls: Secondary | ICD-10-CM

## 2016-04-18 DIAGNOSIS — F3341 Major depressive disorder, recurrent, in partial remission: Secondary | ICD-10-CM

## 2016-04-18 DIAGNOSIS — F0151 Vascular dementia with behavioral disturbance: Secondary | ICD-10-CM

## 2016-04-18 DIAGNOSIS — G894 Chronic pain syndrome: Secondary | ICD-10-CM

## 2016-04-18 DIAGNOSIS — D649 Anemia, unspecified: Secondary | ICD-10-CM | POA: Diagnosis not present

## 2016-04-18 DIAGNOSIS — M7122 Synovial cyst of popliteal space [Baker], left knee: Secondary | ICD-10-CM

## 2016-04-18 DIAGNOSIS — N183 Chronic kidney disease, stage 3 unspecified: Secondary | ICD-10-CM

## 2016-04-18 DIAGNOSIS — R739 Hyperglycemia, unspecified: Secondary | ICD-10-CM

## 2016-04-18 DIAGNOSIS — M7120 Synovial cyst of popliteal space [Baker], unspecified knee: Secondary | ICD-10-CM | POA: Insufficient documentation

## 2016-04-18 DIAGNOSIS — R110 Nausea: Secondary | ICD-10-CM

## 2016-04-18 DIAGNOSIS — K5909 Other constipation: Secondary | ICD-10-CM | POA: Insufficient documentation

## 2016-04-18 DIAGNOSIS — I10 Essential (primary) hypertension: Secondary | ICD-10-CM | POA: Diagnosis not present

## 2016-04-18 DIAGNOSIS — F0150 Vascular dementia without behavioral disturbance: Secondary | ICD-10-CM

## 2016-04-18 DIAGNOSIS — F0153 Vascular dementia, unspecified severity, with mood disturbance: Secondary | ICD-10-CM

## 2016-04-18 DIAGNOSIS — F329 Major depressive disorder, single episode, unspecified: Secondary | ICD-10-CM

## 2016-04-18 DIAGNOSIS — E039 Hypothyroidism, unspecified: Secondary | ICD-10-CM | POA: Insufficient documentation

## 2016-04-18 DIAGNOSIS — R609 Edema, unspecified: Secondary | ICD-10-CM | POA: Insufficient documentation

## 2016-04-18 HISTORY — DX: Synovial cyst of popliteal space (Baker), unspecified knee: M71.20

## 2016-04-18 HISTORY — DX: Hyperglycemia, unspecified: R73.9

## 2016-04-18 MED ORDER — AMITRIPTYLINE HCL 25 MG PO TABS: ORAL_TABLET | ORAL | Status: DC

## 2016-04-18 NOTE — Patient Instructions (Signed)
Stop amlodipine. Reduce amitriptyline to 25 mg nightly.

## 2016-04-18 NOTE — Progress Notes (Signed)
Patient ID: Brittany Hughes, female   DOB: 12-15-1927, 82 y.o.   MRN: [id number]    Facility  [facility]    Place of Service:   OFFICE    Allergies  Allergen Reactions  . Codeine Other (See Comments)    Possibly crazy actions  . Colace [Docusate Calcium] Nausea Only    Chief Complaint  Patient presents with  . Medical Management of Chronic Issues    6 month medication management blood pressure, Peripheral Neuropathy, insomina. Here with niece Coralyn Mark  . Hand Problem    right hand tingling off and on   . Fall    3 times things year, last one Saturday 04/15/16    HPI:  Patient was last seen by me 01/12/2016 for her chronic pain syndrome. We resumed her pentazocine/naloxone. She had failed to respond to gabapentin and tramadol in the past.  Blood pressures continue to be normal to slightly low.  Patient has chronic nausea which is been treated with Phenergan. It seems to have improved recently.  Fell 04/15/16. Sleeps on the couch. Got up and fell against the table and chair. Fell in the bathroom in the past. Multiple bruises on the left arm and left leg.   Paresthesias of the right arm at night.  Chronic pain syndrome - controlled with pentazocine  Dementia, vascular, with depression - patient needs follow-up MMSE  Hyperglycemia -  patient was unaware that her glucose was elevated in February 2017    Frequent falls - difficult to know what this is actually related to, but it seems prudent to try to get her blood pressure into a normal range but not too low and to reduce her amitriptyline at hospital.   Chronic kidney disease, stage III (moderate) - needs follow-up lab   Anemia, unspecified anemia type - patient unaware that she had low hemoglobin in December 2016 and again in February 2017. She has not seen any blood in the stool or urine .  Hypothyroidism, unspecified hypothyroidism type - follow-up lab as needed   Edema, unspecified type - chronic swelling in the left leg more  than the right but present bilaterally. Reportedly had a Baker's cyst in the past. Also has a history of DVT.   Baker's cyst, left - I'm not able to feel this yesterday.    Recurrent major depressive disorder, in partial remission ([place]) - is using amitriptyline for more than 30 years for sleep, anxiety, and depression. Initial prescription was with a psychiatrist, Dr. Guinevere Scarlet.    Medications: Patient's Medications  New Prescriptions   No medications on file  Previous Medications   ALPRAZOLAM (XANAX) 0.25 MG TABLET    TAKE 1 TABLET EVERY DAY AS NEEDED FOR ANXIETY   AMITRIPTYLINE (ELAVIL) 50 MG TABLET    TAKE 1 TABLET BY MOUTH AT BEDTIME   AMLODIPINE (NORVASC) 10 MG TABLET    TAKE 1 TABLET BY MOUTH EVERY DAY   ANTISEPTIC ORAL RINSE (BIOTENE) LIQD    1 application by Mouth Rinse route every 4 (four) hours as needed for dry mouth.   CALCIUM CARBONATE (OS-CAL) 600 MG TABS    Take 600 mg by mouth 2 (two) times daily with a meal.    LACTULOSE 20 GM/30ML SOLN    Take 30 mLs (20 g total) by mouth 2 (two) times daily. As needed for constipation   LEVOTHYROXINE (SYNTHROID, LEVOTHROID) 25 MCG TABLET    TAKE 1 TABLET (25 MCG TOTAL) BY MOUTH DAILY BEFORE BREAKFAST.   MELATONIN 3 MG  TABS    Take 1 tablet (3 mg total) by mouth daily.   METOPROLOL SUCCINATE (TOPROL-XL) 25 MG 24 HR TABLET    TAKE 1 TABLET BY MOUTH EVERY DAY FOR BLOOD PRESSURE   OMEPRAZOLE (PRILOSEC) 20 MG CAPSULE    TAKE 1 CAPSULE (20 MG TOTAL) BY MOUTH DAILY. FOR ACID REFLUX   PENTAZOCINE-NALOXONE (TALWIN NX) 50-0.5 MG TABLET    TAKE 1 TABLET BY MOUTH EVERY 8 HOURS IF NEEDED FOR PAIN   POLYETHYL GLYCOL-PROPYL GLYCOL (SYSTANE) 0.4-0.3 % SOLN    Apply 1 drop to eye daily as needed (for dry eyes).   PROMETHAZINE (PHENERGAN) 25 MG TABLET    One every 6 hours if needed for nausea.   SIMVASTATIN (ZOCOR) 20 MG TABLET    TAKE 1 TABLET (20 MG TOTAL) BY MOUTH DAILY.  Modified Medications   No medications on file  Discontinued Medications    GABAPENTIN (NEURONTIN) 100 MG CAPSULE    Take one capsule by mouth every 8 hours for pain    Review of Systems  Constitutional: Negative for activity change, appetite change, fatigue and unexpected weight change.  HENT: Negative for congestion and hearing loss.   Eyes: Positive for visual disturbance (Prescription lenses).  Respiratory: Negative for cough and shortness of breath.   Cardiovascular: Positive for leg swelling. Negative for chest pain and palpitations.  Gastrointestinal: Positive for constipation and rectal pain (due to constipation). Negative for abdominal pain and diarrhea.  Endocrine:       Hypothyroid. Elevated blood sugars.  Genitourinary: Negative for dysuria and difficulty urinating.       Urinary incontinence   Musculoskeletal: Positive for myalgias and arthralgias.       Pain in legs  Skin: Negative for color change and wound.  Neurological: Negative for dizziness and weakness.  Psychiatric/Behavioral: Positive for confusion (notes memory loss). Negative for agitation. The patient is nervous/anxious (at times).        History of depression, anxiety, and insomnia.    Filed Vitals:   04/18/16 1349  BP: 110/64  Pulse: 68  Temp: 97.7 F (36.5 C)  TempSrc: Oral  Height: _0  (1.549 m)  Weight: 154 lb (69.854 kg)  SpO2: 99%   Body mass index is 29.11 kg/(m^2). Filed Weights   04/18/16 1349  Weight: 154 lb (69.854 kg)     Physical Exam  Constitutional: She is oriented to person, place, and time. She appears well-developed and well-nourished. No distress.  HENT:  Head: Normocephalic and atraumatic.  Mouth/Throat: Oropharynx is clear and moist. No oropharyngeal exudate.  Eyes: Conjunctivae are normal. Pupils are equal, round, and reactive to light.  Neck: Normal range of motion. Neck supple.  Cardiovascular: Normal rate, regular rhythm and normal heart sounds.   Pulmonary/Chest: Effort normal and breath sounds normal.  Abdominal: Soft. Bowel sounds are  normal. She exhibits no distension. There is no tenderness. There is no rebound and no guarding.  Musculoskeletal: She exhibits edema (left leg with worsening edema, 1+ edema on right). She exhibits no tenderness.  Neurological: She is alert and oriented to person, place, and time.  Skin: Skin is warm and dry. She is not diaphoretic.  Psychiatric: She has a normal mood and affect. Her behavior is normal. Thought content normal. Her speech is rapid and/or pressured.    Labs reviewed: Lab Summary Latest Ref Rng 12/20/2015 10/05/2015  Hemoglobin 12.0 - 15.0 g/dL 10.2(L) 10.1(L)  Hematocrit 36.0 - 46.0 % 32.9(L) 31.6(L)  White count 4.0 - 10.5 K/uL 11.8(H)  7.9  Platelet count 150 - 400 K/uL 196 167  Sodium 135 - 145 mmol/L 138 142  Potassium 3.5 - 5.1 mmol/L 3.9 4.6  Calcium 8.9 - 10.3 mg/dL 9.3 9.2  Phosphorus - (None) (None)  Creatinine 0.44 - 1.00 mg/dL 1.66(H) 1.31(H)  AST 15 - 41 U/L 22 (None)  Alk Phos 38 - 126 U/L 70 (None)  Bilirubin 0.3 - 1.2 mg/dL 0.4 (None)  Glucose 65 - 99 mg/dL 138(H) 96  Cholesterol - (None) (None)  HDL cholesterol - (None) (None)  Triglycerides - (None) (None)  LDL Direct - (None) (None)  LDL Calc - (None) (None)  Total protein 6.5 - 8.1 g/dL 7.0 (None)  Albumin 3.5 - 5.0 g/dL 3.9 (None)   Lab Results  Component Value Date   TSH 4.280 04/15/2015   TSH 3.140 12/08/2014   TSH 7.620* 09/29/2014   T3TOTAL 100 12/08/2014   T4TOTAL 5.7 09/29/2014   Lab Results  Component Value Date   BUN 16 12/20/2015   BUN 20 10/05/2015   BUN 15 04/15/2015   No results found for: HGBA1C  Assessment/Plan.diagm 1. Chronic pain syndrome Continue pentazocine/ naloxone  2. Nausea without vomiting Improved  3. Essential hypertension Discontinue amlodipine Continue metoprolol succinate - CMP   4. Dementia, vascular, with depression MMSE next visit   5. Hyperglycemia - CMP - Hemoglobin A1c  6. Frequent falls discontinue amlodipine and reduced dosage of  amitriptyline  7. Chronic kidney disease, stage III (moderate) - CMP  8. Anemia, unspecified anemia type - CBC with Differential/Platelet  9. Hypothyroidism, unspecified hypothyroidism type - TSH  10. Edema, unspecified type  hopefully this will improve a little with discontinuation of amlodipine  11. Baker's cyst, left Unable to feel this today   12. Recurrent major depressive disorder, in partial remission ([facility]) - amitriptyline (ELAVIL) 25 MG tablet; One at bed to help rest and nerves  Dispense: 90 tablet; Refill: 3

## 2016-04-19 ENCOUNTER — Other Ambulatory Visit: Payer: Self-pay | Admitting: Internal Medicine

## 2016-04-19 NOTE — Telephone Encounter (Signed)
I wanted to know if you wanted to make any changes in this medication, since the patient is falling.

## 2016-04-19 NOTE — Telephone Encounter (Signed)
No changes. Refill as written.

## 2016-04-21 ENCOUNTER — Other Ambulatory Visit: Payer: Medicare Other

## 2016-04-21 DIAGNOSIS — D649 Anemia, unspecified: Secondary | ICD-10-CM | POA: Diagnosis not present

## 2016-04-21 DIAGNOSIS — I10 Essential (primary) hypertension: Secondary | ICD-10-CM | POA: Diagnosis not present

## 2016-04-21 DIAGNOSIS — N183 Chronic kidney disease, stage 3 (moderate): Secondary | ICD-10-CM | POA: Diagnosis not present

## 2016-04-21 DIAGNOSIS — E039 Hypothyroidism, unspecified: Secondary | ICD-10-CM | POA: Diagnosis not present

## 2016-04-21 DIAGNOSIS — R739 Hyperglycemia, unspecified: Secondary | ICD-10-CM | POA: Diagnosis not present

## 2016-04-22 LAB — CBC WITH DIFFERENTIAL/PLATELET
BASOS: 0 %
Basophils Absolute: 0 10*3/uL (ref 0.0–0.2)
EOS (ABSOLUTE): 0.1 10*3/uL (ref 0.0–0.4)
EOS: 1 %
HEMATOCRIT: 34.8 % (ref 34.0–46.6)
HEMOGLOBIN: 11.2 g/dL (ref 11.1–15.9)
Immature Grans (Abs): 0 10*3/uL (ref 0.0–0.1)
Immature Granulocytes: 0 %
LYMPHS ABS: 2.7 10*3/uL (ref 0.7–3.1)
Lymphs: 29 %
MCH: 25.3 pg — ABNORMAL LOW (ref 26.6–33.0)
MCHC: 32.2 g/dL (ref 31.5–35.7)
MCV: 79 fL (ref 79–97)
MONOCYTES: 6 %
Monocytes Absolute: 0.5 10*3/uL (ref 0.1–0.9)
NEUTROS ABS: 6 10*3/uL (ref 1.4–7.0)
Neutrophils: 64 %
Platelets: 214 10*3/uL (ref 150–379)
RBC: 4.42 x10E6/uL (ref 3.77–5.28)
RDW: 15.6 % — ABNORMAL HIGH (ref 12.3–15.4)
WBC: 9.4 10*3/uL (ref 3.4–10.8)

## 2016-04-22 LAB — COMPREHENSIVE METABOLIC PANEL
A/G RATIO: 1.6 (ref 1.2–2.2)
ALBUMIN: 4.3 g/dL (ref 3.5–4.7)
ALK PHOS: 74 IU/L (ref 39–117)
ALT: 7 IU/L (ref 0–32)
AST: 13 IU/L (ref 0–40)
BUN / CREAT RATIO: 11 — AB (ref 12–28)
BUN: 15 mg/dL (ref 8–27)
Bilirubin Total: 0.2 mg/dL (ref 0.0–1.2)
CO2: 21 mmol/L (ref 18–29)
CREATININE: 1.35 mg/dL — AB (ref 0.57–1.00)
Calcium: 9.7 mg/dL (ref 8.7–10.3)
Chloride: 103 mmol/L (ref 96–106)
GFR calc Af Amer: 41 mL/min/{1.73_m2} — ABNORMAL LOW (ref >59)
GFR, EST NON AFRICAN AMERICAN: 36 mL/min/{1.73_m2} — AB (ref >59)
GLOBULIN, TOTAL: 2.7 g/dL (ref 1.5–4.5)
Glucose: 130 mg/dL — ABNORMAL HIGH (ref 65–99)
Potassium: 4.6 mmol/L (ref 3.5–5.2)
SODIUM: 141 mmol/L (ref 134–144)
Total Protein: 7 g/dL (ref 6.0–8.5)

## 2016-04-22 LAB — HEMOGLOBIN A1C
Est. average glucose Bld gHb Est-mCnc: 137 mg/dL
Hgb A1c MFr Bld: 6.4 % — ABNORMAL HIGH (ref 4.8–5.6)

## 2016-04-22 LAB — TSH: TSH: 5.39 u[IU]/mL — ABNORMAL HIGH (ref 0.450–4.500)

## 2016-05-01 ENCOUNTER — Ambulatory Visit (INDEPENDENT_AMBULATORY_CARE_PROVIDER_SITE_OTHER): Payer: Medicare Other | Admitting: Family Medicine

## 2016-05-01 VITALS — BP 146/90 | HR 69 | Temp 98.1°F | Resp 16 | Ht 61.0 in | Wt 149.6 lb

## 2016-05-01 DIAGNOSIS — B372 Candidiasis of skin and nail: Secondary | ICD-10-CM | POA: Diagnosis not present

## 2016-05-01 LAB — GLUCOSE, POCT (MANUAL RESULT ENTRY): POC Glucose: 100 mg/dl — AB (ref 70–99)

## 2016-05-01 NOTE — Progress Notes (Signed)
Patient ID: Brittany Hughes, female    DOB: 01-04-1928  Age: 82 y.o. MRN: [id number]  Chief Complaint  Patient presents with  . Rash    under her breast, noticed it on Saturday     Subjective:   Patient has a rash under her breasts and is concerned she could have shingles. It's primarily under the left breast. Her sister told her to use some cornstarch on it but she was concerned and came in here. Memory is not real good. Her sister is with her.  Current allergies, medications, problem list, past/family and social histories reviewed.  Objective:  BP 146/90 mmHg  Pulse 69  Temp(Src) 98.1 F (36.7 C) (Oral)  Resp 16  Ht '5\' 1"'$  (1.549 m)  Wt 149 lb 9.6 oz (67.858 kg)  BMI 28.28 kg/m2  SpO2 96%  Intertriginous rash under left breast. Check glucose, normal at 100   Assessment & Plan:   Assessment: 1. Candidiasis, intertrigo       Plan: Treatment for the intertrigo  Orders Placed This Encounter  Procedures  . POCT glucose (manual entry)    No orders of the defined types were placed in this encounter.    Results for orders placed or performed in visit on 05/01/16  POCT glucose (manual entry)  Result Value Ref Range   POC Glucose 100 (A) 70 - 99 mg/dl        Patient Instructions   Your blood sugar is good  Use Lotrimin (generic store brand is clotrimazole) twice daily on rash    IF you received an x-ray today, you will receive an invoice from [facility]. Please contact [facility] at [phone] with questions or concerns regarding your invoice.   IF you received labwork today, you will receive an invoice from [employer]. Please contact [facility] at [phone] with questions or concerns regarding your invoice.   Our billing staff will not be able to assist you with questions regarding bills from these companies.  You will be contacted with the lab results as soon as they are available. The fastest way to get  your results is to activate your My Chart account. Instructions are located on the last page of this paperwork. If you have not heard from [place] regarding the results in 2 weeks, please contact this office.          No Follow-up on file.   HOPPER,DAVID, MD 05/01/2016

## 2016-05-01 NOTE — Patient Instructions (Addendum)
Your blood sugar is good  Use Lotrimin (generic store brand is clotrimazole) twice daily on rash    IF you received an x-ray today, you will receive an invoice from [facility]. Please contact [facility] at [phone] with questions or concerns regarding your invoice.   IF you received labwork today, you will receive an invoice from [employer]. Please contact [facility] at [phone] with questions or concerns regarding your invoice.   Our billing staff will not be able to assist you with questions regarding bills from these companies.  You will be contacted with the lab results as soon as they are available. The fastest way to get your results is to activate your My Chart account. Instructions are located on the last page of this paperwork. If you have not heard from [place] regarding the results in 2 weeks, please contact this office.

## 2016-05-02 ENCOUNTER — Telehealth: Payer: Self-pay | Admitting: *Deleted

## 2016-05-02 DIAGNOSIS — N39498 Other specified urinary incontinence: Secondary | ICD-10-CM

## 2016-05-02 NOTE — Telephone Encounter (Signed)
Patient called and stated that she needs a referral or something called in. Stated that about every hour she urinates on herself and doesn't realize it until it is running down her leg. She wants to know if something can be called in for this because she no longer has a driver's license and has to depend on someone else. Please Advise.

## 2016-05-02 NOTE — Telephone Encounter (Signed)
I would suggest a referral to urologist.

## 2016-05-03 NOTE — Telephone Encounter (Signed)
Patient notified and agreed. Referral placed.

## 2016-05-09 ENCOUNTER — Other Ambulatory Visit: Payer: Self-pay | Admitting: Nurse Practitioner

## 2016-05-12 ENCOUNTER — Other Ambulatory Visit: Payer: Self-pay | Admitting: *Deleted

## 2016-05-12 ENCOUNTER — Other Ambulatory Visit: Payer: Self-pay | Admitting: Nurse Practitioner

## 2016-05-12 MED ORDER — PENTAZOCINE-NALOXONE HCL 50-0.5 MG PO TABS: 1.0000 | ORAL_TABLET | Freq: Three times a day (TID) | ORAL | Status: DC | PRN

## 2016-05-13 ENCOUNTER — Other Ambulatory Visit: Payer: Self-pay | Admitting: Nurse Practitioner

## 2016-05-30 ENCOUNTER — Other Ambulatory Visit: Payer: Self-pay | Admitting: Internal Medicine

## 2016-06-09 ENCOUNTER — Other Ambulatory Visit: Payer: Self-pay | Admitting: Internal Medicine

## 2016-06-12 ENCOUNTER — Other Ambulatory Visit: Payer: Self-pay | Admitting: Internal Medicine

## 2016-06-12 ENCOUNTER — Telehealth: Payer: Self-pay

## 2016-06-12 NOTE — Telephone Encounter (Signed)
I called Mrs. Lohmann to let her know that her prescription is ready for pick up. Rx was placed in filing cabinet at front desk.

## 2016-06-14 ENCOUNTER — Other Ambulatory Visit: Payer: Self-pay | Admitting: Internal Medicine

## 2016-06-16 DIAGNOSIS — N3946 Mixed incontinence: Secondary | ICD-10-CM | POA: Diagnosis not present

## 2016-06-16 DIAGNOSIS — N3944 Nocturnal enuresis: Secondary | ICD-10-CM | POA: Diagnosis not present

## 2016-06-16 DIAGNOSIS — R351 Nocturia: Secondary | ICD-10-CM | POA: Diagnosis not present

## 2016-06-16 DIAGNOSIS — N3020 Other chronic cystitis without hematuria: Secondary | ICD-10-CM | POA: Diagnosis not present

## 2016-06-16 DIAGNOSIS — R350 Frequency of micturition: Secondary | ICD-10-CM | POA: Diagnosis not present

## 2016-06-16 DIAGNOSIS — N3942 Incontinence without sensory awareness: Secondary | ICD-10-CM | POA: Diagnosis not present

## 2016-06-20 ENCOUNTER — Ambulatory Visit (INDEPENDENT_AMBULATORY_CARE_PROVIDER_SITE_OTHER): Payer: Medicare Other | Admitting: Internal Medicine

## 2016-06-20 ENCOUNTER — Encounter: Payer: Self-pay | Admitting: Internal Medicine

## 2016-06-20 VITALS — BP 152/70 | HR 67 | Temp 98.1°F | Ht 61.0 in | Wt 153.0 lb

## 2016-06-20 DIAGNOSIS — R079 Chest pain, unspecified: Secondary | ICD-10-CM

## 2016-06-20 DIAGNOSIS — G3184 Mild cognitive impairment, so stated: Secondary | ICD-10-CM | POA: Diagnosis not present

## 2016-06-20 DIAGNOSIS — R269 Unspecified abnormalities of gait and mobility: Secondary | ICD-10-CM | POA: Diagnosis not present

## 2016-06-20 DIAGNOSIS — R110 Nausea: Secondary | ICD-10-CM | POA: Diagnosis not present

## 2016-06-20 DIAGNOSIS — R296 Repeated falls: Secondary | ICD-10-CM

## 2016-06-20 DIAGNOSIS — F329 Major depressive disorder, single episode, unspecified: Secondary | ICD-10-CM | POA: Diagnosis not present

## 2016-06-20 DIAGNOSIS — F0150 Vascular dementia without behavioral disturbance: Secondary | ICD-10-CM

## 2016-06-20 DIAGNOSIS — F0151 Vascular dementia with behavioral disturbance: Secondary | ICD-10-CM

## 2016-06-20 DIAGNOSIS — F0153 Vascular dementia, unspecified severity, with mood disturbance: Secondary | ICD-10-CM

## 2016-06-20 DIAGNOSIS — I10 Essential (primary) hypertension: Secondary | ICD-10-CM

## 2016-06-20 NOTE — Progress Notes (Signed)
Facility  [facility]    Place of Service:   OFFICE    Allergies  Allergen Reactions  . Codeine Other (See Comments)    Possibly crazy actions  . Colace [Docusate Calcium] Nausea Only    Chief Complaint  Patient presents with  . Acute Visit    falling for year about 6 times. Fell Saturday 06/17/16 hit head, left hip sore, neck hurts. Staying in bed since then afraid to get [place]. Here with a friend Katharine Look.  Marland Kitchen Dysphagia    feels like she's going to vomit when she eats, has to force herself, eats a lot of ice cream.    HPI:  Frequent falls - Patient is getting more wobbly. She has had multiple falls. She has suffered skin tears and bruises as a result. We reduced some of her medications when I saw her 2 months ago. This included amitriptyline and amlodipine. There has been no change in her falls since then. Patient has a difficult time describing what happens around her fall she does notice that she is "light headed". She denies headaches, palpitations, chest pain, nausea, or legs "giving way". She tends to slide her feet according to the woman who is in attendance with her today. She has some off-balance sensations when she stands from a seated position or gets off the toilet. She has a walker with 4 wheels at home, but she doesn't always keep it handy and use it. She has had a previous CT brain scan that showed cerebral atrophy and microvascular disease as well as an old basal ganglia infarct. Lab work in June was essentially unremarkable.  Abnormality of gait - if the patient leans in any direction just a few inches off center, she has a tendency to fall in that direction. Patient's had falls posteriorly as well as to each side and forward.  Essential hypertension - mild elevation in systolic blood pressure today  Mild cognitive impairment - significant impairment in short-term memory. Memories from years ago seemed to stick a little bit better.  Chest pain, unspecified chest pain type -  episode of central chest discomfort with discomfort radiating down the left arm that lasted about 20 minutes last night. It was not accompanied by nausea, palpitations, or dyspnea. It did not seem to be associated with meals.  Nausea without vomiting - patient has noticed improvement in this in the last 3-4 weeks. Despite a comment to the nurse that she was having choking sensations, she says that the whole problem seems improved.  Dementia, vascular, with depression - patient has radiologic evidence suggestive of vascular dementia. She maxed have a combination such as possible Alzheimer's disease.    Medications: Patient's Medications  New Prescriptions   No medications on file  Previous Medications   ALPRAZOLAM (XANAX) 0.25 MG TABLET    TAKE 1 TABLET BY MOUTH EVERY DAY FOR ANXIETY   AMITRIPTYLINE (ELAVIL) 25 MG TABLET    One at bed to help rest and nerves   ANTISEPTIC ORAL RINSE (BIOTENE) LIQD    1 application by Mouth Rinse route every 4 (four) hours as needed for dry mouth.   CALCIUM CARBONATE (OS-CAL) 600 MG TABS    Take 600 mg by mouth 2 (two) times daily with a meal.    CIPROFLOXACIN (CIPRO) 250 MG TABLET    Take one twice daily for 7 days for UTI, started 06/16/16   LACTULOSE 20 GM/30ML SOLN    Take 30 mLs (20 g total) by mouth 2 (  two) times daily. As needed for constipation   LEVOTHYROXINE (SYNTHROID, LEVOTHROID) 25 MCG TABLET    TAKE 1 TABLET (25 MCG TOTAL) BY MOUTH DAILY BEFORE BREAKFAST.   MELATONIN 3 MG TABS    Take 1 tablet (3 mg total) by mouth daily.   METOPROLOL SUCCINATE (TOPROL-XL) 25 MG 24 HR TABLET    TAKE 1 TABLET BY MOUTH EVERY DAY FOR BLOOD PRESSURE   OMEPRAZOLE (PRILOSEC) 20 MG CAPSULE    TAKE 1 CAPSULE (20 MG TOTAL) BY MOUTH DAILY. FOR ACID REFLUX   PENTAZOCINE-NALOXONE (TALWIN NX) 50-0.5 MG TABLET    TAKE 1 TABLET BY MOUTH EVERY 8 HOURS AS NEEDED   POLYETHYL GLYCOL-PROPYL GLYCOL (SYSTANE) 0.4-0.3 % SOLN    Apply 1 drop to eye daily as needed (for dry eyes).    PROMETHAZINE (PHENERGAN) 25 MG TABLET    One every 6 hours if needed for nausea.   SIMVASTATIN (ZOCOR) 20 MG TABLET    TAKE 1 TABLET (20 MG TOTAL) BY MOUTH DAILY.  Modified Medications   No medications on file  Discontinued Medications   No medications on file    Review of Systems  Constitutional: Negative for activity change, appetite change, fatigue and unexpected weight change.  HENT: Negative for congestion and hearing loss.   Eyes: Positive for visual disturbance (Prescription lenses).  Respiratory: Negative for cough and shortness of breath.   Cardiovascular: Positive for chest pain (06/19/2016 lasting 20 minutes and accompanied by left arm discomfort, but no diaphoresis or dyspnea.) and leg swelling. Negative for palpitations.  Gastrointestinal: Positive for constipation and rectal pain (due to constipation). Negative for abdominal pain and diarrhea.  Endocrine:       Hypothyroid. Elevated blood sugars.  Genitourinary: Negative for difficulty urinating and dysuria.       Urinary incontinence   Musculoskeletal: Positive for arthralgias, gait problem and myalgias.       Pain in legs  Skin: Negative for color change and wound.       Multiple ecchymoses of the arms related to falls and traumatic injuries  Neurological: Negative for dizziness and weakness.  Psychiatric/Behavioral: Positive for confusion (notes memory loss) and decreased concentration. Negative for agitation. The patient is nervous/anxious (at times).        History of depression, anxiety, and insomnia.    Vitals:   06/20/16 1452  BP: (!) 152/70  Pulse: 67  Temp: 98.1 F (36.7 C)  TempSrc: Oral  SpO2: 96%  Weight: 153 lb (69.4 kg)  Height: 5' 1"  (1.549 m)   Body mass index is 28.91 kg/m. Wt Readings from Last 3 Encounters:  06/20/16 153 lb (69.4 kg)  05/01/16 149 lb 9.6 oz (67.9 kg)  04/18/16 154 lb (69.9 kg)      Physical Exam  Constitutional: She is oriented to person, place, and time. She appears  well-developed and well-nourished. No distress.  HENT:  Head: Normocephalic and atraumatic.  Mouth/Throat: Oropharynx is clear and moist. No oropharyngeal exudate.  Eyes: Conjunctivae are normal. Pupils are equal, round, and reactive to light.  Neck: Normal range of motion. Neck supple.  Cardiovascular: Normal rate, regular rhythm and normal heart sounds.   Pulmonary/Chest: Effort normal and breath sounds normal.  Abdominal: Soft. Bowel sounds are normal. She exhibits no distension. There is no tenderness. There is no rebound and no guarding.  Musculoskeletal: She exhibits edema (left leg with worsening edema, 1+ edema on right). She exhibits no tenderness.  Neurological: She is alert and oriented to person, place, and  time.  Unstable with walking. She has a tendency to fall to any side if she leans just a few inches off center.  Skin: Skin is warm and dry. She is not diaphoretic.  Multiple ecchymoses of the arms related to falls and injuries  Psychiatric: She has a normal mood and affect. Her behavior is normal. Thought content normal. Her speech is rapid and/or pressured.    Labs reviewed: Lab Summary Latest Ref Rng & Units 04/21/2016 12/20/2015 10/05/2015  Hemoglobin 11.1 - 15.9 g/dL 11.2 10.2(L) 10.1(L)  Hematocrit 34.0 - 46.6 % 34.8 32.9(L) 31.6(L)  White count 3.4 - 10.8 x10E3/uL 9.4 11.8(H) 7.9  Platelet count 150 - 379 x10E3/uL 214 196 167  Sodium 134 - 144 mmol/L 141 138 142  Potassium 3.5 - 5.2 mmol/L 4.6 3.9 4.6  Calcium 8.7 - 10.3 mg/dL 9.7 9.3 9.2  Phosphorus - (None) (None) (None)  Creatinine 0.57 - 1.00 mg/dL 1.35(H) 1.66(H) 1.31(H)  AST 0 - 40 IU/L 13 22 (None)  Alk Phos 39 - 117 IU/L 74 70 (None)  Bilirubin 0.0 - 1.2 mg/dL 0.2 0.4 (None)  Glucose 65 - 99 mg/dL 130(H) 138(H) 96  Cholesterol - (None) (None) (None)  HDL cholesterol - (None) (None) (None)  Triglycerides - (None) (None) (None)  LDL Direct - (None) (None) (None)  LDL Calc - (None) (None) (None)  Total  protein 6.5 - 8.1 g/dL (None) 7.0 (None)  Albumin 3.5 - 4.7 g/dL 4.3 3.9 (None)  Some recent data might be hidden   Lab Results  Component Value Date   TSH 5.390 (H) 04/21/2016   TSH 4.280 04/15/2015   TSH 3.140 12/08/2014   T3TOTAL 100 12/08/2014   T4TOTAL 5.7 09/29/2014   Lab Results  Component Value Date   BUN 15 04/21/2016   BUN 16 12/20/2015   BUN 20 10/05/2015   Lab Results  Component Value Date   HGBA1C 6.4 (H) 04/21/2016   06/20/2016 EKG: Rate 74, normal sinus rhythm. T abnormality in anteroseptal leads. No ST segment depression or ectopy. Compared to EKG done 04/22/2015, there is no substantial changes  Assessment/Plan  1. Frequent falls - Ambulatory referral to Physical Therapy  2. Abnormality of gait - Ambulatory referral to Physical Therapy  3. Essential hypertension Reasonably well controlled  4. Mild cognitive impairment MMSE next visit  5. Chest pain, unspecified chest pain type I see nothing on the electrocardiogram suggest myocardial infarction and the pains that she describes today are not typical for angina.  6. Nausea without vomiting Improved  7. Dementia, vascular, with depression Possibly related to deteriorating gait as well as her memory

## 2016-06-21 NOTE — Addendum Note (Signed)
Addended by: Ripley Fraise on: 06/21/2016 08:37 AM   Modules accepted: Orders

## 2016-06-22 NOTE — Addendum Note (Signed)
Addended by: Rafael Bihari A on: 06/22/2016 11:37 AM   Modules accepted: Orders

## 2016-06-23 DIAGNOSIS — I129 Hypertensive chronic kidney disease with stage 1 through stage 4 chronic kidney disease, or unspecified chronic kidney disease: Secondary | ICD-10-CM | POA: Diagnosis not present

## 2016-06-23 DIAGNOSIS — N390 Urinary tract infection, site not specified: Secondary | ICD-10-CM | POA: Diagnosis not present

## 2016-06-23 DIAGNOSIS — F0151 Vascular dementia with behavioral disturbance: Secondary | ICD-10-CM | POA: Diagnosis not present

## 2016-06-23 DIAGNOSIS — F329 Major depressive disorder, single episode, unspecified: Secondary | ICD-10-CM | POA: Diagnosis not present

## 2016-06-23 DIAGNOSIS — G894 Chronic pain syndrome: Secondary | ICD-10-CM | POA: Diagnosis not present

## 2016-06-23 DIAGNOSIS — I69398 Other sequelae of cerebral infarction: Secondary | ICD-10-CM | POA: Diagnosis not present

## 2016-06-23 DIAGNOSIS — N183 Chronic kidney disease, stage 3 (moderate): Secondary | ICD-10-CM | POA: Diagnosis not present

## 2016-06-23 DIAGNOSIS — M6281 Muscle weakness (generalized): Secondary | ICD-10-CM | POA: Diagnosis not present

## 2016-06-24 DIAGNOSIS — Z85828 Personal history of other malignant neoplasm of skin: Secondary | ICD-10-CM | POA: Diagnosis not present

## 2016-06-24 DIAGNOSIS — G629 Polyneuropathy, unspecified: Secondary | ICD-10-CM | POA: Diagnosis not present

## 2016-06-24 DIAGNOSIS — F0151 Vascular dementia with behavioral disturbance: Secondary | ICD-10-CM | POA: Diagnosis not present

## 2016-06-24 DIAGNOSIS — G894 Chronic pain syndrome: Secondary | ICD-10-CM | POA: Diagnosis not present

## 2016-06-24 DIAGNOSIS — N183 Chronic kidney disease, stage 3 (moderate): Secondary | ICD-10-CM | POA: Diagnosis not present

## 2016-06-24 DIAGNOSIS — Z87891 Personal history of nicotine dependence: Secondary | ICD-10-CM | POA: Diagnosis not present

## 2016-06-24 DIAGNOSIS — I129 Hypertensive chronic kidney disease with stage 1 through stage 4 chronic kidney disease, or unspecified chronic kidney disease: Secondary | ICD-10-CM | POA: Diagnosis not present

## 2016-06-24 DIAGNOSIS — R296 Repeated falls: Secondary | ICD-10-CM | POA: Diagnosis not present

## 2016-06-24 DIAGNOSIS — N390 Urinary tract infection, site not specified: Secondary | ICD-10-CM | POA: Diagnosis not present

## 2016-06-24 DIAGNOSIS — M6281 Muscle weakness (generalized): Secondary | ICD-10-CM | POA: Diagnosis not present

## 2016-06-24 DIAGNOSIS — I69398 Other sequelae of cerebral infarction: Secondary | ICD-10-CM | POA: Diagnosis not present

## 2016-06-24 DIAGNOSIS — F329 Major depressive disorder, single episode, unspecified: Secondary | ICD-10-CM | POA: Diagnosis not present

## 2016-06-26 ENCOUNTER — Other Ambulatory Visit: Payer: Self-pay | Admitting: Nurse Practitioner

## 2016-06-26 ENCOUNTER — Other Ambulatory Visit: Payer: Self-pay | Admitting: Internal Medicine

## 2016-06-27 DIAGNOSIS — F0151 Vascular dementia with behavioral disturbance: Secondary | ICD-10-CM | POA: Diagnosis not present

## 2016-06-27 DIAGNOSIS — I129 Hypertensive chronic kidney disease with stage 1 through stage 4 chronic kidney disease, or unspecified chronic kidney disease: Secondary | ICD-10-CM | POA: Diagnosis not present

## 2016-06-27 DIAGNOSIS — N183 Chronic kidney disease, stage 3 (moderate): Secondary | ICD-10-CM | POA: Diagnosis not present

## 2016-06-27 DIAGNOSIS — I69398 Other sequelae of cerebral infarction: Secondary | ICD-10-CM | POA: Diagnosis not present

## 2016-06-27 DIAGNOSIS — N390 Urinary tract infection, site not specified: Secondary | ICD-10-CM | POA: Diagnosis not present

## 2016-06-27 DIAGNOSIS — M6281 Muscle weakness (generalized): Secondary | ICD-10-CM | POA: Diagnosis not present

## 2016-06-28 DIAGNOSIS — I129 Hypertensive chronic kidney disease with stage 1 through stage 4 chronic kidney disease, or unspecified chronic kidney disease: Secondary | ICD-10-CM | POA: Diagnosis not present

## 2016-06-28 DIAGNOSIS — I69398 Other sequelae of cerebral infarction: Secondary | ICD-10-CM | POA: Diagnosis not present

## 2016-06-28 DIAGNOSIS — M6281 Muscle weakness (generalized): Secondary | ICD-10-CM | POA: Diagnosis not present

## 2016-06-28 DIAGNOSIS — N183 Chronic kidney disease, stage 3 (moderate): Secondary | ICD-10-CM | POA: Diagnosis not present

## 2016-06-28 DIAGNOSIS — F0151 Vascular dementia with behavioral disturbance: Secondary | ICD-10-CM | POA: Diagnosis not present

## 2016-06-28 DIAGNOSIS — N390 Urinary tract infection, site not specified: Secondary | ICD-10-CM | POA: Diagnosis not present

## 2016-06-30 DIAGNOSIS — F0151 Vascular dementia with behavioral disturbance: Secondary | ICD-10-CM | POA: Diagnosis not present

## 2016-06-30 DIAGNOSIS — M6281 Muscle weakness (generalized): Secondary | ICD-10-CM | POA: Diagnosis not present

## 2016-06-30 DIAGNOSIS — N390 Urinary tract infection, site not specified: Secondary | ICD-10-CM | POA: Diagnosis not present

## 2016-06-30 DIAGNOSIS — I129 Hypertensive chronic kidney disease with stage 1 through stage 4 chronic kidney disease, or unspecified chronic kidney disease: Secondary | ICD-10-CM | POA: Diagnosis not present

## 2016-06-30 DIAGNOSIS — I69398 Other sequelae of cerebral infarction: Secondary | ICD-10-CM | POA: Diagnosis not present

## 2016-06-30 DIAGNOSIS — N183 Chronic kidney disease, stage 3 (moderate): Secondary | ICD-10-CM | POA: Diagnosis not present

## 2016-07-03 DIAGNOSIS — I129 Hypertensive chronic kidney disease with stage 1 through stage 4 chronic kidney disease, or unspecified chronic kidney disease: Secondary | ICD-10-CM | POA: Diagnosis not present

## 2016-07-03 DIAGNOSIS — I69398 Other sequelae of cerebral infarction: Secondary | ICD-10-CM | POA: Diagnosis not present

## 2016-07-03 DIAGNOSIS — F0151 Vascular dementia with behavioral disturbance: Secondary | ICD-10-CM | POA: Diagnosis not present

## 2016-07-03 DIAGNOSIS — N390 Urinary tract infection, site not specified: Secondary | ICD-10-CM | POA: Diagnosis not present

## 2016-07-03 DIAGNOSIS — M6281 Muscle weakness (generalized): Secondary | ICD-10-CM | POA: Diagnosis not present

## 2016-07-03 DIAGNOSIS — N183 Chronic kidney disease, stage 3 (moderate): Secondary | ICD-10-CM | POA: Diagnosis not present

## 2016-07-04 DIAGNOSIS — N183 Chronic kidney disease, stage 3 (moderate): Secondary | ICD-10-CM | POA: Diagnosis not present

## 2016-07-04 DIAGNOSIS — N390 Urinary tract infection, site not specified: Secondary | ICD-10-CM | POA: Diagnosis not present

## 2016-07-04 DIAGNOSIS — F0151 Vascular dementia with behavioral disturbance: Secondary | ICD-10-CM | POA: Diagnosis not present

## 2016-07-04 DIAGNOSIS — M6281 Muscle weakness (generalized): Secondary | ICD-10-CM | POA: Diagnosis not present

## 2016-07-04 DIAGNOSIS — I129 Hypertensive chronic kidney disease with stage 1 through stage 4 chronic kidney disease, or unspecified chronic kidney disease: Secondary | ICD-10-CM | POA: Diagnosis not present

## 2016-07-04 DIAGNOSIS — I69398 Other sequelae of cerebral infarction: Secondary | ICD-10-CM | POA: Diagnosis not present

## 2016-07-05 DIAGNOSIS — N3020 Other chronic cystitis without hematuria: Secondary | ICD-10-CM | POA: Diagnosis not present

## 2016-07-05 DIAGNOSIS — N3946 Mixed incontinence: Secondary | ICD-10-CM | POA: Diagnosis not present

## 2016-07-07 DIAGNOSIS — I69398 Other sequelae of cerebral infarction: Secondary | ICD-10-CM | POA: Diagnosis not present

## 2016-07-07 DIAGNOSIS — I129 Hypertensive chronic kidney disease with stage 1 through stage 4 chronic kidney disease, or unspecified chronic kidney disease: Secondary | ICD-10-CM | POA: Diagnosis not present

## 2016-07-07 DIAGNOSIS — M6281 Muscle weakness (generalized): Secondary | ICD-10-CM | POA: Diagnosis not present

## 2016-07-07 DIAGNOSIS — F0151 Vascular dementia with behavioral disturbance: Secondary | ICD-10-CM | POA: Diagnosis not present

## 2016-07-07 DIAGNOSIS — N183 Chronic kidney disease, stage 3 (moderate): Secondary | ICD-10-CM | POA: Diagnosis not present

## 2016-07-07 DIAGNOSIS — N390 Urinary tract infection, site not specified: Secondary | ICD-10-CM | POA: Diagnosis not present

## 2016-07-11 DIAGNOSIS — I69398 Other sequelae of cerebral infarction: Secondary | ICD-10-CM | POA: Diagnosis not present

## 2016-07-11 DIAGNOSIS — N390 Urinary tract infection, site not specified: Secondary | ICD-10-CM | POA: Diagnosis not present

## 2016-07-11 DIAGNOSIS — M6281 Muscle weakness (generalized): Secondary | ICD-10-CM | POA: Diagnosis not present

## 2016-07-11 DIAGNOSIS — F0151 Vascular dementia with behavioral disturbance: Secondary | ICD-10-CM | POA: Diagnosis not present

## 2016-07-11 DIAGNOSIS — N183 Chronic kidney disease, stage 3 (moderate): Secondary | ICD-10-CM | POA: Diagnosis not present

## 2016-07-11 DIAGNOSIS — I129 Hypertensive chronic kidney disease with stage 1 through stage 4 chronic kidney disease, or unspecified chronic kidney disease: Secondary | ICD-10-CM | POA: Diagnosis not present

## 2016-07-13 ENCOUNTER — Other Ambulatory Visit: Payer: Self-pay | Admitting: Nurse Practitioner

## 2016-07-14 DIAGNOSIS — F0151 Vascular dementia with behavioral disturbance: Secondary | ICD-10-CM | POA: Diagnosis not present

## 2016-07-14 DIAGNOSIS — M6281 Muscle weakness (generalized): Secondary | ICD-10-CM | POA: Diagnosis not present

## 2016-07-14 DIAGNOSIS — I129 Hypertensive chronic kidney disease with stage 1 through stage 4 chronic kidney disease, or unspecified chronic kidney disease: Secondary | ICD-10-CM | POA: Diagnosis not present

## 2016-07-14 DIAGNOSIS — N183 Chronic kidney disease, stage 3 (moderate): Secondary | ICD-10-CM | POA: Diagnosis not present

## 2016-07-14 DIAGNOSIS — I69398 Other sequelae of cerebral infarction: Secondary | ICD-10-CM | POA: Diagnosis not present

## 2016-07-14 DIAGNOSIS — N390 Urinary tract infection, site not specified: Secondary | ICD-10-CM | POA: Diagnosis not present

## 2016-07-17 DIAGNOSIS — M6281 Muscle weakness (generalized): Secondary | ICD-10-CM | POA: Diagnosis not present

## 2016-07-17 DIAGNOSIS — F0151 Vascular dementia with behavioral disturbance: Secondary | ICD-10-CM | POA: Diagnosis not present

## 2016-07-17 DIAGNOSIS — N183 Chronic kidney disease, stage 3 (moderate): Secondary | ICD-10-CM | POA: Diagnosis not present

## 2016-07-17 DIAGNOSIS — I129 Hypertensive chronic kidney disease with stage 1 through stage 4 chronic kidney disease, or unspecified chronic kidney disease: Secondary | ICD-10-CM | POA: Diagnosis not present

## 2016-07-17 DIAGNOSIS — N390 Urinary tract infection, site not specified: Secondary | ICD-10-CM | POA: Diagnosis not present

## 2016-07-17 DIAGNOSIS — I69398 Other sequelae of cerebral infarction: Secondary | ICD-10-CM | POA: Diagnosis not present

## 2016-07-18 ENCOUNTER — Telehealth: Payer: Self-pay | Admitting: *Deleted

## 2016-07-18 NOTE — Telephone Encounter (Signed)
Received fax from [facility] [phone] and Promethazine was APPROVED 11/05/15-11/05/16 Referral #: [id number]

## 2016-07-18 NOTE — Telephone Encounter (Signed)
Received fax from [employer] for phenergan for prior authorization. Key [id number]. Completed online and determination within 1-5 days.

## 2016-07-19 DIAGNOSIS — N183 Chronic kidney disease, stage 3 (moderate): Secondary | ICD-10-CM | POA: Diagnosis not present

## 2016-07-19 DIAGNOSIS — M6281 Muscle weakness (generalized): Secondary | ICD-10-CM | POA: Diagnosis not present

## 2016-07-19 DIAGNOSIS — N390 Urinary tract infection, site not specified: Secondary | ICD-10-CM | POA: Diagnosis not present

## 2016-07-19 DIAGNOSIS — I69398 Other sequelae of cerebral infarction: Secondary | ICD-10-CM | POA: Diagnosis not present

## 2016-07-19 DIAGNOSIS — F0151 Vascular dementia with behavioral disturbance: Secondary | ICD-10-CM | POA: Diagnosis not present

## 2016-07-19 DIAGNOSIS — I129 Hypertensive chronic kidney disease with stage 1 through stage 4 chronic kidney disease, or unspecified chronic kidney disease: Secondary | ICD-10-CM | POA: Diagnosis not present

## 2016-07-20 DIAGNOSIS — I129 Hypertensive chronic kidney disease with stage 1 through stage 4 chronic kidney disease, or unspecified chronic kidney disease: Secondary | ICD-10-CM | POA: Diagnosis not present

## 2016-07-20 DIAGNOSIS — N183 Chronic kidney disease, stage 3 (moderate): Secondary | ICD-10-CM | POA: Diagnosis not present

## 2016-07-20 DIAGNOSIS — M6281 Muscle weakness (generalized): Secondary | ICD-10-CM | POA: Diagnosis not present

## 2016-07-20 DIAGNOSIS — F0151 Vascular dementia with behavioral disturbance: Secondary | ICD-10-CM | POA: Diagnosis not present

## 2016-07-20 DIAGNOSIS — N390 Urinary tract infection, site not specified: Secondary | ICD-10-CM | POA: Diagnosis not present

## 2016-07-20 DIAGNOSIS — I69398 Other sequelae of cerebral infarction: Secondary | ICD-10-CM | POA: Diagnosis not present

## 2016-07-24 DIAGNOSIS — F0151 Vascular dementia with behavioral disturbance: Secondary | ICD-10-CM | POA: Diagnosis not present

## 2016-07-24 DIAGNOSIS — M6281 Muscle weakness (generalized): Secondary | ICD-10-CM | POA: Diagnosis not present

## 2016-07-24 DIAGNOSIS — N390 Urinary tract infection, site not specified: Secondary | ICD-10-CM | POA: Diagnosis not present

## 2016-07-24 DIAGNOSIS — N183 Chronic kidney disease, stage 3 (moderate): Secondary | ICD-10-CM | POA: Diagnosis not present

## 2016-07-24 DIAGNOSIS — I69398 Other sequelae of cerebral infarction: Secondary | ICD-10-CM | POA: Diagnosis not present

## 2016-07-24 DIAGNOSIS — I129 Hypertensive chronic kidney disease with stage 1 through stage 4 chronic kidney disease, or unspecified chronic kidney disease: Secondary | ICD-10-CM | POA: Diagnosis not present

## 2016-07-25 DIAGNOSIS — M6281 Muscle weakness (generalized): Secondary | ICD-10-CM | POA: Diagnosis not present

## 2016-07-25 DIAGNOSIS — I129 Hypertensive chronic kidney disease with stage 1 through stage 4 chronic kidney disease, or unspecified chronic kidney disease: Secondary | ICD-10-CM | POA: Diagnosis not present

## 2016-07-25 DIAGNOSIS — N183 Chronic kidney disease, stage 3 (moderate): Secondary | ICD-10-CM | POA: Diagnosis not present

## 2016-07-25 DIAGNOSIS — I69398 Other sequelae of cerebral infarction: Secondary | ICD-10-CM | POA: Diagnosis not present

## 2016-07-25 DIAGNOSIS — N390 Urinary tract infection, site not specified: Secondary | ICD-10-CM | POA: Diagnosis not present

## 2016-07-25 DIAGNOSIS — F0151 Vascular dementia with behavioral disturbance: Secondary | ICD-10-CM | POA: Diagnosis not present

## 2016-08-01 DIAGNOSIS — I69398 Other sequelae of cerebral infarction: Secondary | ICD-10-CM | POA: Diagnosis not present

## 2016-08-01 DIAGNOSIS — I129 Hypertensive chronic kidney disease with stage 1 through stage 4 chronic kidney disease, or unspecified chronic kidney disease: Secondary | ICD-10-CM | POA: Diagnosis not present

## 2016-08-01 DIAGNOSIS — F0151 Vascular dementia with behavioral disturbance: Secondary | ICD-10-CM | POA: Diagnosis not present

## 2016-08-01 DIAGNOSIS — M6281 Muscle weakness (generalized): Secondary | ICD-10-CM | POA: Diagnosis not present

## 2016-08-01 DIAGNOSIS — N390 Urinary tract infection, site not specified: Secondary | ICD-10-CM | POA: Diagnosis not present

## 2016-08-01 DIAGNOSIS — N183 Chronic kidney disease, stage 3 (moderate): Secondary | ICD-10-CM | POA: Diagnosis not present

## 2016-08-03 DIAGNOSIS — F0151 Vascular dementia with behavioral disturbance: Secondary | ICD-10-CM | POA: Diagnosis not present

## 2016-08-03 DIAGNOSIS — M6281 Muscle weakness (generalized): Secondary | ICD-10-CM | POA: Diagnosis not present

## 2016-08-03 DIAGNOSIS — N390 Urinary tract infection, site not specified: Secondary | ICD-10-CM | POA: Diagnosis not present

## 2016-08-03 DIAGNOSIS — I69398 Other sequelae of cerebral infarction: Secondary | ICD-10-CM | POA: Diagnosis not present

## 2016-08-03 DIAGNOSIS — I129 Hypertensive chronic kidney disease with stage 1 through stage 4 chronic kidney disease, or unspecified chronic kidney disease: Secondary | ICD-10-CM | POA: Diagnosis not present

## 2016-08-03 DIAGNOSIS — N183 Chronic kidney disease, stage 3 (moderate): Secondary | ICD-10-CM | POA: Diagnosis not present

## 2016-08-08 DIAGNOSIS — F0151 Vascular dementia with behavioral disturbance: Secondary | ICD-10-CM | POA: Diagnosis not present

## 2016-08-08 DIAGNOSIS — I69398 Other sequelae of cerebral infarction: Secondary | ICD-10-CM | POA: Diagnosis not present

## 2016-08-08 DIAGNOSIS — N390 Urinary tract infection, site not specified: Secondary | ICD-10-CM | POA: Diagnosis not present

## 2016-08-08 DIAGNOSIS — M6281 Muscle weakness (generalized): Secondary | ICD-10-CM | POA: Diagnosis not present

## 2016-08-08 DIAGNOSIS — N183 Chronic kidney disease, stage 3 (moderate): Secondary | ICD-10-CM | POA: Diagnosis not present

## 2016-08-08 DIAGNOSIS — I129 Hypertensive chronic kidney disease with stage 1 through stage 4 chronic kidney disease, or unspecified chronic kidney disease: Secondary | ICD-10-CM | POA: Diagnosis not present

## 2016-08-10 ENCOUNTER — Other Ambulatory Visit: Payer: Self-pay | Admitting: Nurse Practitioner

## 2016-08-10 DIAGNOSIS — I69398 Other sequelae of cerebral infarction: Secondary | ICD-10-CM | POA: Diagnosis not present

## 2016-08-10 DIAGNOSIS — N183 Chronic kidney disease, stage 3 (moderate): Secondary | ICD-10-CM | POA: Diagnosis not present

## 2016-08-10 DIAGNOSIS — I129 Hypertensive chronic kidney disease with stage 1 through stage 4 chronic kidney disease, or unspecified chronic kidney disease: Secondary | ICD-10-CM | POA: Diagnosis not present

## 2016-08-10 DIAGNOSIS — N390 Urinary tract infection, site not specified: Secondary | ICD-10-CM | POA: Diagnosis not present

## 2016-08-10 DIAGNOSIS — M6281 Muscle weakness (generalized): Secondary | ICD-10-CM | POA: Diagnosis not present

## 2016-08-10 DIAGNOSIS — F0151 Vascular dementia with behavioral disturbance: Secondary | ICD-10-CM | POA: Diagnosis not present

## 2016-08-14 DIAGNOSIS — N3946 Mixed incontinence: Secondary | ICD-10-CM | POA: Diagnosis not present

## 2016-08-16 ENCOUNTER — Encounter: Payer: Self-pay | Admitting: Nurse Practitioner

## 2016-08-16 ENCOUNTER — Ambulatory Visit: Payer: Medicare Other | Admitting: Internal Medicine

## 2016-08-16 DIAGNOSIS — N390 Urinary tract infection, site not specified: Secondary | ICD-10-CM | POA: Diagnosis not present

## 2016-08-16 DIAGNOSIS — N183 Chronic kidney disease, stage 3 (moderate): Secondary | ICD-10-CM | POA: Diagnosis not present

## 2016-08-16 DIAGNOSIS — M6281 Muscle weakness (generalized): Secondary | ICD-10-CM | POA: Diagnosis not present

## 2016-08-16 DIAGNOSIS — F0151 Vascular dementia with behavioral disturbance: Secondary | ICD-10-CM | POA: Diagnosis not present

## 2016-08-16 DIAGNOSIS — I69398 Other sequelae of cerebral infarction: Secondary | ICD-10-CM | POA: Diagnosis not present

## 2016-08-16 DIAGNOSIS — I129 Hypertensive chronic kidney disease with stage 1 through stage 4 chronic kidney disease, or unspecified chronic kidney disease: Secondary | ICD-10-CM | POA: Diagnosis not present

## 2016-08-17 DIAGNOSIS — N390 Urinary tract infection, site not specified: Secondary | ICD-10-CM | POA: Diagnosis not present

## 2016-08-17 DIAGNOSIS — I69398 Other sequelae of cerebral infarction: Secondary | ICD-10-CM | POA: Diagnosis not present

## 2016-08-17 DIAGNOSIS — I129 Hypertensive chronic kidney disease with stage 1 through stage 4 chronic kidney disease, or unspecified chronic kidney disease: Secondary | ICD-10-CM | POA: Diagnosis not present

## 2016-08-17 DIAGNOSIS — F0151 Vascular dementia with behavioral disturbance: Secondary | ICD-10-CM | POA: Diagnosis not present

## 2016-08-17 DIAGNOSIS — N183 Chronic kidney disease, stage 3 (moderate): Secondary | ICD-10-CM | POA: Diagnosis not present

## 2016-08-17 DIAGNOSIS — M6281 Muscle weakness (generalized): Secondary | ICD-10-CM | POA: Diagnosis not present

## 2016-08-21 DIAGNOSIS — N183 Chronic kidney disease, stage 3 (moderate): Secondary | ICD-10-CM | POA: Diagnosis not present

## 2016-08-21 DIAGNOSIS — I129 Hypertensive chronic kidney disease with stage 1 through stage 4 chronic kidney disease, or unspecified chronic kidney disease: Secondary | ICD-10-CM | POA: Diagnosis not present

## 2016-08-21 DIAGNOSIS — I69398 Other sequelae of cerebral infarction: Secondary | ICD-10-CM | POA: Diagnosis not present

## 2016-08-21 DIAGNOSIS — N390 Urinary tract infection, site not specified: Secondary | ICD-10-CM | POA: Diagnosis not present

## 2016-08-21 DIAGNOSIS — M6281 Muscle weakness (generalized): Secondary | ICD-10-CM | POA: Diagnosis not present

## 2016-08-21 DIAGNOSIS — F0151 Vascular dementia with behavioral disturbance: Secondary | ICD-10-CM | POA: Diagnosis not present

## 2016-08-22 ENCOUNTER — Ambulatory Visit: Payer: Medicare Other | Admitting: Nurse Practitioner

## 2016-08-24 ENCOUNTER — Telehealth: Payer: Self-pay

## 2016-08-24 DIAGNOSIS — E034 Atrophy of thyroid (acquired): Secondary | ICD-10-CM | POA: Diagnosis not present

## 2016-08-24 DIAGNOSIS — K5901 Slow transit constipation: Secondary | ICD-10-CM | POA: Diagnosis not present

## 2016-08-24 DIAGNOSIS — R52 Pain, unspecified: Secondary | ICD-10-CM | POA: Diagnosis not present

## 2016-08-24 DIAGNOSIS — F419 Anxiety disorder, unspecified: Secondary | ICD-10-CM | POA: Diagnosis not present

## 2016-08-24 DIAGNOSIS — G4700 Insomnia, unspecified: Secondary | ICD-10-CM | POA: Diagnosis not present

## 2016-08-24 DIAGNOSIS — R413 Other amnesia: Secondary | ICD-10-CM | POA: Diagnosis not present

## 2016-08-24 DIAGNOSIS — I10 Essential (primary) hypertension: Secondary | ICD-10-CM | POA: Diagnosis not present

## 2016-08-24 DIAGNOSIS — Z23 Encounter for immunization: Secondary | ICD-10-CM | POA: Diagnosis not present

## 2016-08-24 NOTE — Telephone Encounter (Signed)
Patient called to inform Dr.Green and Janett Billow that she is no longer a patient here. Patient is a part of a Back to Basic program where the doctor comes to your house. This is more suitable for the patient because she no longer drives

## 2016-08-25 NOTE — Telephone Encounter (Signed)
Thank her for her call. If there is a time she would like to return to active care here, we would welcome her back into the practice

## 2016-08-29 ENCOUNTER — Other Ambulatory Visit: Payer: Self-pay | Admitting: Nurse Practitioner

## 2016-08-30 ENCOUNTER — Ambulatory Visit: Payer: Medicare Other | Admitting: Internal Medicine

## 2016-08-31 ENCOUNTER — Other Ambulatory Visit: Payer: Self-pay | Admitting: Internal Medicine

## 2016-09-26 DIAGNOSIS — R413 Other amnesia: Secondary | ICD-10-CM | POA: Diagnosis not present

## 2016-09-26 DIAGNOSIS — R52 Pain, unspecified: Secondary | ICD-10-CM | POA: Diagnosis not present

## 2016-09-26 DIAGNOSIS — Z9181 History of falling: Secondary | ICD-10-CM | POA: Diagnosis not present

## 2016-09-26 DIAGNOSIS — I10 Essential (primary) hypertension: Secondary | ICD-10-CM | POA: Diagnosis not present

## 2016-09-26 DIAGNOSIS — G4700 Insomnia, unspecified: Secondary | ICD-10-CM | POA: Diagnosis not present

## 2016-09-26 DIAGNOSIS — K5901 Slow transit constipation: Secondary | ICD-10-CM | POA: Diagnosis not present

## 2016-10-01 DIAGNOSIS — I129 Hypertensive chronic kidney disease with stage 1 through stage 4 chronic kidney disease, or unspecified chronic kidney disease: Secondary | ICD-10-CM | POA: Diagnosis not present

## 2016-10-01 DIAGNOSIS — N183 Chronic kidney disease, stage 3 (moderate): Secondary | ICD-10-CM | POA: Diagnosis not present

## 2016-10-01 DIAGNOSIS — F329 Major depressive disorder, single episode, unspecified: Secondary | ICD-10-CM | POA: Diagnosis not present

## 2016-10-01 DIAGNOSIS — I69311 Memory deficit following cerebral infarction: Secondary | ICD-10-CM | POA: Diagnosis not present

## 2016-10-01 DIAGNOSIS — F0150 Vascular dementia without behavioral disturbance: Secondary | ICD-10-CM | POA: Diagnosis not present

## 2016-10-01 DIAGNOSIS — I69393 Ataxia following cerebral infarction: Secondary | ICD-10-CM | POA: Diagnosis not present

## 2016-10-02 ENCOUNTER — Other Ambulatory Visit: Payer: Self-pay | Admitting: Internal Medicine

## 2016-10-03 DIAGNOSIS — N183 Chronic kidney disease, stage 3 (moderate): Secondary | ICD-10-CM | POA: Diagnosis not present

## 2016-10-03 DIAGNOSIS — I69393 Ataxia following cerebral infarction: Secondary | ICD-10-CM | POA: Diagnosis not present

## 2016-10-03 DIAGNOSIS — F0150 Vascular dementia without behavioral disturbance: Secondary | ICD-10-CM | POA: Diagnosis not present

## 2016-10-03 DIAGNOSIS — I69311 Memory deficit following cerebral infarction: Secondary | ICD-10-CM | POA: Diagnosis not present

## 2016-10-03 DIAGNOSIS — F329 Major depressive disorder, single episode, unspecified: Secondary | ICD-10-CM | POA: Diagnosis not present

## 2016-10-03 DIAGNOSIS — I129 Hypertensive chronic kidney disease with stage 1 through stage 4 chronic kidney disease, or unspecified chronic kidney disease: Secondary | ICD-10-CM | POA: Diagnosis not present

## 2016-10-06 DIAGNOSIS — F0150 Vascular dementia without behavioral disturbance: Secondary | ICD-10-CM | POA: Diagnosis not present

## 2016-10-06 DIAGNOSIS — I129 Hypertensive chronic kidney disease with stage 1 through stage 4 chronic kidney disease, or unspecified chronic kidney disease: Secondary | ICD-10-CM | POA: Diagnosis not present

## 2016-10-06 DIAGNOSIS — I69393 Ataxia following cerebral infarction: Secondary | ICD-10-CM | POA: Diagnosis not present

## 2016-10-06 DIAGNOSIS — F329 Major depressive disorder, single episode, unspecified: Secondary | ICD-10-CM | POA: Diagnosis not present

## 2016-10-06 DIAGNOSIS — N183 Chronic kidney disease, stage 3 (moderate): Secondary | ICD-10-CM | POA: Diagnosis not present

## 2016-10-06 DIAGNOSIS — I69311 Memory deficit following cerebral infarction: Secondary | ICD-10-CM | POA: Diagnosis not present

## 2016-10-09 DIAGNOSIS — F0150 Vascular dementia without behavioral disturbance: Secondary | ICD-10-CM | POA: Diagnosis not present

## 2016-10-09 DIAGNOSIS — I69393 Ataxia following cerebral infarction: Secondary | ICD-10-CM | POA: Diagnosis not present

## 2016-10-09 DIAGNOSIS — I69311 Memory deficit following cerebral infarction: Secondary | ICD-10-CM | POA: Diagnosis not present

## 2016-10-09 DIAGNOSIS — N183 Chronic kidney disease, stage 3 (moderate): Secondary | ICD-10-CM | POA: Diagnosis not present

## 2016-10-09 DIAGNOSIS — F329 Major depressive disorder, single episode, unspecified: Secondary | ICD-10-CM | POA: Diagnosis not present

## 2016-10-09 DIAGNOSIS — I129 Hypertensive chronic kidney disease with stage 1 through stage 4 chronic kidney disease, or unspecified chronic kidney disease: Secondary | ICD-10-CM | POA: Diagnosis not present

## 2016-10-10 DIAGNOSIS — I129 Hypertensive chronic kidney disease with stage 1 through stage 4 chronic kidney disease, or unspecified chronic kidney disease: Secondary | ICD-10-CM | POA: Diagnosis not present

## 2016-10-10 DIAGNOSIS — I69393 Ataxia following cerebral infarction: Secondary | ICD-10-CM | POA: Diagnosis not present

## 2016-10-10 DIAGNOSIS — F0150 Vascular dementia without behavioral disturbance: Secondary | ICD-10-CM | POA: Diagnosis not present

## 2016-10-10 DIAGNOSIS — I69311 Memory deficit following cerebral infarction: Secondary | ICD-10-CM | POA: Diagnosis not present

## 2016-10-10 DIAGNOSIS — F329 Major depressive disorder, single episode, unspecified: Secondary | ICD-10-CM | POA: Diagnosis not present

## 2016-10-10 DIAGNOSIS — N183 Chronic kidney disease, stage 3 (moderate): Secondary | ICD-10-CM | POA: Diagnosis not present

## 2016-10-12 DIAGNOSIS — F329 Major depressive disorder, single episode, unspecified: Secondary | ICD-10-CM | POA: Diagnosis not present

## 2016-10-12 DIAGNOSIS — I129 Hypertensive chronic kidney disease with stage 1 through stage 4 chronic kidney disease, or unspecified chronic kidney disease: Secondary | ICD-10-CM | POA: Diagnosis not present

## 2016-10-12 DIAGNOSIS — N183 Chronic kidney disease, stage 3 (moderate): Secondary | ICD-10-CM | POA: Diagnosis not present

## 2016-10-12 DIAGNOSIS — F0150 Vascular dementia without behavioral disturbance: Secondary | ICD-10-CM | POA: Diagnosis not present

## 2016-10-12 DIAGNOSIS — I69311 Memory deficit following cerebral infarction: Secondary | ICD-10-CM | POA: Diagnosis not present

## 2016-10-12 DIAGNOSIS — I69393 Ataxia following cerebral infarction: Secondary | ICD-10-CM | POA: Diagnosis not present

## 2016-10-13 DIAGNOSIS — I69393 Ataxia following cerebral infarction: Secondary | ICD-10-CM | POA: Diagnosis not present

## 2016-10-13 DIAGNOSIS — N183 Chronic kidney disease, stage 3 (moderate): Secondary | ICD-10-CM | POA: Diagnosis not present

## 2016-10-13 DIAGNOSIS — I69311 Memory deficit following cerebral infarction: Secondary | ICD-10-CM | POA: Diagnosis not present

## 2016-10-13 DIAGNOSIS — F0150 Vascular dementia without behavioral disturbance: Secondary | ICD-10-CM | POA: Diagnosis not present

## 2016-10-13 DIAGNOSIS — F329 Major depressive disorder, single episode, unspecified: Secondary | ICD-10-CM | POA: Diagnosis not present

## 2016-10-13 DIAGNOSIS — I129 Hypertensive chronic kidney disease with stage 1 through stage 4 chronic kidney disease, or unspecified chronic kidney disease: Secondary | ICD-10-CM | POA: Diagnosis not present

## 2016-10-16 ENCOUNTER — Ambulatory Visit (INDEPENDENT_AMBULATORY_CARE_PROVIDER_SITE_OTHER): Payer: Medicare Other | Admitting: Ophthalmology

## 2016-10-16 DIAGNOSIS — I69393 Ataxia following cerebral infarction: Secondary | ICD-10-CM | POA: Diagnosis not present

## 2016-10-16 DIAGNOSIS — F0150 Vascular dementia without behavioral disturbance: Secondary | ICD-10-CM | POA: Diagnosis not present

## 2016-10-16 DIAGNOSIS — N183 Chronic kidney disease, stage 3 (moderate): Secondary | ICD-10-CM | POA: Diagnosis not present

## 2016-10-16 DIAGNOSIS — F329 Major depressive disorder, single episode, unspecified: Secondary | ICD-10-CM | POA: Diagnosis not present

## 2016-10-16 DIAGNOSIS — I69311 Memory deficit following cerebral infarction: Secondary | ICD-10-CM | POA: Diagnosis not present

## 2016-10-16 DIAGNOSIS — I129 Hypertensive chronic kidney disease with stage 1 through stage 4 chronic kidney disease, or unspecified chronic kidney disease: Secondary | ICD-10-CM | POA: Diagnosis not present

## 2016-10-18 DIAGNOSIS — I69393 Ataxia following cerebral infarction: Secondary | ICD-10-CM | POA: Diagnosis not present

## 2016-10-18 DIAGNOSIS — I69311 Memory deficit following cerebral infarction: Secondary | ICD-10-CM | POA: Diagnosis not present

## 2016-10-18 DIAGNOSIS — I129 Hypertensive chronic kidney disease with stage 1 through stage 4 chronic kidney disease, or unspecified chronic kidney disease: Secondary | ICD-10-CM | POA: Diagnosis not present

## 2016-10-18 DIAGNOSIS — F0150 Vascular dementia without behavioral disturbance: Secondary | ICD-10-CM | POA: Diagnosis not present

## 2016-10-18 DIAGNOSIS — F329 Major depressive disorder, single episode, unspecified: Secondary | ICD-10-CM | POA: Diagnosis not present

## 2016-10-18 DIAGNOSIS — N183 Chronic kidney disease, stage 3 (moderate): Secondary | ICD-10-CM | POA: Diagnosis not present

## 2016-10-19 DIAGNOSIS — N183 Chronic kidney disease, stage 3 (moderate): Secondary | ICD-10-CM | POA: Diagnosis not present

## 2016-10-19 DIAGNOSIS — F0150 Vascular dementia without behavioral disturbance: Secondary | ICD-10-CM | POA: Diagnosis not present

## 2016-10-19 DIAGNOSIS — I69393 Ataxia following cerebral infarction: Secondary | ICD-10-CM | POA: Diagnosis not present

## 2016-10-19 DIAGNOSIS — I129 Hypertensive chronic kidney disease with stage 1 through stage 4 chronic kidney disease, or unspecified chronic kidney disease: Secondary | ICD-10-CM | POA: Diagnosis not present

## 2016-10-19 DIAGNOSIS — F329 Major depressive disorder, single episode, unspecified: Secondary | ICD-10-CM | POA: Diagnosis not present

## 2016-10-19 DIAGNOSIS — I69311 Memory deficit following cerebral infarction: Secondary | ICD-10-CM | POA: Diagnosis not present

## 2016-10-20 DIAGNOSIS — I69311 Memory deficit following cerebral infarction: Secondary | ICD-10-CM | POA: Diagnosis not present

## 2016-10-20 DIAGNOSIS — F329 Major depressive disorder, single episode, unspecified: Secondary | ICD-10-CM | POA: Diagnosis not present

## 2016-10-20 DIAGNOSIS — I69393 Ataxia following cerebral infarction: Secondary | ICD-10-CM | POA: Diagnosis not present

## 2016-10-20 DIAGNOSIS — I129 Hypertensive chronic kidney disease with stage 1 through stage 4 chronic kidney disease, or unspecified chronic kidney disease: Secondary | ICD-10-CM | POA: Diagnosis not present

## 2016-10-20 DIAGNOSIS — N183 Chronic kidney disease, stage 3 (moderate): Secondary | ICD-10-CM | POA: Diagnosis not present

## 2016-10-20 DIAGNOSIS — F0150 Vascular dementia without behavioral disturbance: Secondary | ICD-10-CM | POA: Diagnosis not present

## 2016-10-23 DIAGNOSIS — F0150 Vascular dementia without behavioral disturbance: Secondary | ICD-10-CM | POA: Diagnosis not present

## 2016-10-23 DIAGNOSIS — F329 Major depressive disorder, single episode, unspecified: Secondary | ICD-10-CM | POA: Diagnosis not present

## 2016-10-23 DIAGNOSIS — I69393 Ataxia following cerebral infarction: Secondary | ICD-10-CM | POA: Diagnosis not present

## 2016-10-23 DIAGNOSIS — I129 Hypertensive chronic kidney disease with stage 1 through stage 4 chronic kidney disease, or unspecified chronic kidney disease: Secondary | ICD-10-CM | POA: Diagnosis not present

## 2016-10-23 DIAGNOSIS — N183 Chronic kidney disease, stage 3 (moderate): Secondary | ICD-10-CM | POA: Diagnosis not present

## 2016-10-23 DIAGNOSIS — I69311 Memory deficit following cerebral infarction: Secondary | ICD-10-CM | POA: Diagnosis not present

## 2016-10-27 DIAGNOSIS — F419 Anxiety disorder, unspecified: Secondary | ICD-10-CM | POA: Diagnosis not present

## 2016-10-27 DIAGNOSIS — K5901 Slow transit constipation: Secondary | ICD-10-CM | POA: Diagnosis not present

## 2016-10-27 DIAGNOSIS — R413 Other amnesia: Secondary | ICD-10-CM | POA: Diagnosis not present

## 2016-10-27 DIAGNOSIS — I10 Essential (primary) hypertension: Secondary | ICD-10-CM | POA: Diagnosis not present

## 2016-10-27 DIAGNOSIS — R52 Pain, unspecified: Secondary | ICD-10-CM | POA: Diagnosis not present

## 2016-10-27 DIAGNOSIS — E034 Atrophy of thyroid (acquired): Secondary | ICD-10-CM | POA: Diagnosis not present

## 2016-10-27 DIAGNOSIS — G4700 Insomnia, unspecified: Secondary | ICD-10-CM | POA: Diagnosis not present

## 2016-10-31 DIAGNOSIS — I69311 Memory deficit following cerebral infarction: Secondary | ICD-10-CM | POA: Diagnosis not present

## 2016-10-31 DIAGNOSIS — I129 Hypertensive chronic kidney disease with stage 1 through stage 4 chronic kidney disease, or unspecified chronic kidney disease: Secondary | ICD-10-CM | POA: Diagnosis not present

## 2016-10-31 DIAGNOSIS — F329 Major depressive disorder, single episode, unspecified: Secondary | ICD-10-CM | POA: Diagnosis not present

## 2016-10-31 DIAGNOSIS — I69393 Ataxia following cerebral infarction: Secondary | ICD-10-CM | POA: Diagnosis not present

## 2016-10-31 DIAGNOSIS — F0150 Vascular dementia without behavioral disturbance: Secondary | ICD-10-CM | POA: Diagnosis not present

## 2016-10-31 DIAGNOSIS — N183 Chronic kidney disease, stage 3 (moderate): Secondary | ICD-10-CM | POA: Diagnosis not present

## 2016-11-01 DIAGNOSIS — G629 Polyneuropathy, unspecified: Secondary | ICD-10-CM | POA: Diagnosis not present

## 2016-11-01 DIAGNOSIS — D519 Vitamin B12 deficiency anemia, unspecified: Secondary | ICD-10-CM | POA: Diagnosis not present

## 2016-11-01 DIAGNOSIS — F0150 Vascular dementia without behavioral disturbance: Secondary | ICD-10-CM | POA: Diagnosis not present

## 2016-11-01 DIAGNOSIS — I129 Hypertensive chronic kidney disease with stage 1 through stage 4 chronic kidney disease, or unspecified chronic kidney disease: Secondary | ICD-10-CM | POA: Diagnosis not present

## 2016-11-01 DIAGNOSIS — I69311 Memory deficit following cerebral infarction: Secondary | ICD-10-CM | POA: Diagnosis not present

## 2016-11-01 DIAGNOSIS — F329 Major depressive disorder, single episode, unspecified: Secondary | ICD-10-CM | POA: Diagnosis not present

## 2016-11-01 DIAGNOSIS — I69393 Ataxia following cerebral infarction: Secondary | ICD-10-CM | POA: Diagnosis not present

## 2016-11-01 DIAGNOSIS — H8111 Benign paroxysmal vertigo, right ear: Secondary | ICD-10-CM | POA: Diagnosis not present

## 2016-11-01 DIAGNOSIS — N183 Chronic kidney disease, stage 3 (moderate): Secondary | ICD-10-CM | POA: Diagnosis not present

## 2016-11-07 DIAGNOSIS — F329 Major depressive disorder, single episode, unspecified: Secondary | ICD-10-CM | POA: Diagnosis not present

## 2016-11-07 DIAGNOSIS — I69393 Ataxia following cerebral infarction: Secondary | ICD-10-CM | POA: Diagnosis not present

## 2016-11-07 DIAGNOSIS — F0150 Vascular dementia without behavioral disturbance: Secondary | ICD-10-CM | POA: Diagnosis not present

## 2016-11-07 DIAGNOSIS — N183 Chronic kidney disease, stage 3 (moderate): Secondary | ICD-10-CM | POA: Diagnosis not present

## 2016-11-07 DIAGNOSIS — I129 Hypertensive chronic kidney disease with stage 1 through stage 4 chronic kidney disease, or unspecified chronic kidney disease: Secondary | ICD-10-CM | POA: Diagnosis not present

## 2016-11-07 DIAGNOSIS — I69311 Memory deficit following cerebral infarction: Secondary | ICD-10-CM | POA: Diagnosis not present

## 2016-11-11 DIAGNOSIS — I69311 Memory deficit following cerebral infarction: Secondary | ICD-10-CM | POA: Diagnosis not present

## 2016-11-11 DIAGNOSIS — F0150 Vascular dementia without behavioral disturbance: Secondary | ICD-10-CM | POA: Diagnosis not present

## 2016-11-11 DIAGNOSIS — F329 Major depressive disorder, single episode, unspecified: Secondary | ICD-10-CM | POA: Diagnosis not present

## 2016-11-11 DIAGNOSIS — I129 Hypertensive chronic kidney disease with stage 1 through stage 4 chronic kidney disease, or unspecified chronic kidney disease: Secondary | ICD-10-CM | POA: Diagnosis not present

## 2016-11-11 DIAGNOSIS — I69393 Ataxia following cerebral infarction: Secondary | ICD-10-CM | POA: Diagnosis not present

## 2016-11-11 DIAGNOSIS — N183 Chronic kidney disease, stage 3 (moderate): Secondary | ICD-10-CM | POA: Diagnosis not present

## 2016-11-13 DIAGNOSIS — I129 Hypertensive chronic kidney disease with stage 1 through stage 4 chronic kidney disease, or unspecified chronic kidney disease: Secondary | ICD-10-CM | POA: Diagnosis not present

## 2016-11-13 DIAGNOSIS — F0150 Vascular dementia without behavioral disturbance: Secondary | ICD-10-CM | POA: Diagnosis not present

## 2016-11-13 DIAGNOSIS — N183 Chronic kidney disease, stage 3 (moderate): Secondary | ICD-10-CM | POA: Diagnosis not present

## 2016-11-13 DIAGNOSIS — F329 Major depressive disorder, single episode, unspecified: Secondary | ICD-10-CM | POA: Diagnosis not present

## 2016-11-13 DIAGNOSIS — I69393 Ataxia following cerebral infarction: Secondary | ICD-10-CM | POA: Diagnosis not present

## 2016-11-13 DIAGNOSIS — I69311 Memory deficit following cerebral infarction: Secondary | ICD-10-CM | POA: Diagnosis not present

## 2016-11-15 DIAGNOSIS — I69311 Memory deficit following cerebral infarction: Secondary | ICD-10-CM | POA: Diagnosis not present

## 2016-11-15 DIAGNOSIS — F329 Major depressive disorder, single episode, unspecified: Secondary | ICD-10-CM | POA: Diagnosis not present

## 2016-11-15 DIAGNOSIS — F0150 Vascular dementia without behavioral disturbance: Secondary | ICD-10-CM | POA: Diagnosis not present

## 2016-11-15 DIAGNOSIS — N183 Chronic kidney disease, stage 3 (moderate): Secondary | ICD-10-CM | POA: Diagnosis not present

## 2016-11-15 DIAGNOSIS — I129 Hypertensive chronic kidney disease with stage 1 through stage 4 chronic kidney disease, or unspecified chronic kidney disease: Secondary | ICD-10-CM | POA: Diagnosis not present

## 2016-11-15 DIAGNOSIS — I69393 Ataxia following cerebral infarction: Secondary | ICD-10-CM | POA: Diagnosis not present

## 2016-11-17 DIAGNOSIS — I69311 Memory deficit following cerebral infarction: Secondary | ICD-10-CM | POA: Diagnosis not present

## 2016-11-17 DIAGNOSIS — F0150 Vascular dementia without behavioral disturbance: Secondary | ICD-10-CM | POA: Diagnosis not present

## 2016-11-17 DIAGNOSIS — I129 Hypertensive chronic kidney disease with stage 1 through stage 4 chronic kidney disease, or unspecified chronic kidney disease: Secondary | ICD-10-CM | POA: Diagnosis not present

## 2016-11-17 DIAGNOSIS — F329 Major depressive disorder, single episode, unspecified: Secondary | ICD-10-CM | POA: Diagnosis not present

## 2016-11-17 DIAGNOSIS — I69393 Ataxia following cerebral infarction: Secondary | ICD-10-CM | POA: Diagnosis not present

## 2016-11-17 DIAGNOSIS — N183 Chronic kidney disease, stage 3 (moderate): Secondary | ICD-10-CM | POA: Diagnosis not present

## 2016-11-20 DIAGNOSIS — N183 Chronic kidney disease, stage 3 (moderate): Secondary | ICD-10-CM | POA: Diagnosis not present

## 2016-11-20 DIAGNOSIS — I69393 Ataxia following cerebral infarction: Secondary | ICD-10-CM | POA: Diagnosis not present

## 2016-11-20 DIAGNOSIS — F0150 Vascular dementia without behavioral disturbance: Secondary | ICD-10-CM | POA: Diagnosis not present

## 2016-11-20 DIAGNOSIS — I129 Hypertensive chronic kidney disease with stage 1 through stage 4 chronic kidney disease, or unspecified chronic kidney disease: Secondary | ICD-10-CM | POA: Diagnosis not present

## 2016-11-20 DIAGNOSIS — F329 Major depressive disorder, single episode, unspecified: Secondary | ICD-10-CM | POA: Diagnosis not present

## 2016-11-20 DIAGNOSIS — I69311 Memory deficit following cerebral infarction: Secondary | ICD-10-CM | POA: Diagnosis not present

## 2016-11-25 DIAGNOSIS — F0150 Vascular dementia without behavioral disturbance: Secondary | ICD-10-CM | POA: Diagnosis not present

## 2016-11-25 DIAGNOSIS — I69393 Ataxia following cerebral infarction: Secondary | ICD-10-CM | POA: Diagnosis not present

## 2016-11-25 DIAGNOSIS — I69311 Memory deficit following cerebral infarction: Secondary | ICD-10-CM | POA: Diagnosis not present

## 2016-11-25 DIAGNOSIS — F329 Major depressive disorder, single episode, unspecified: Secondary | ICD-10-CM | POA: Diagnosis not present

## 2016-11-25 DIAGNOSIS — I129 Hypertensive chronic kidney disease with stage 1 through stage 4 chronic kidney disease, or unspecified chronic kidney disease: Secondary | ICD-10-CM | POA: Diagnosis not present

## 2016-11-25 DIAGNOSIS — N183 Chronic kidney disease, stage 3 (moderate): Secondary | ICD-10-CM | POA: Diagnosis not present

## 2016-11-27 DIAGNOSIS — F0150 Vascular dementia without behavioral disturbance: Secondary | ICD-10-CM | POA: Diagnosis not present

## 2016-11-27 DIAGNOSIS — I69393 Ataxia following cerebral infarction: Secondary | ICD-10-CM | POA: Diagnosis not present

## 2016-11-27 DIAGNOSIS — F329 Major depressive disorder, single episode, unspecified: Secondary | ICD-10-CM | POA: Diagnosis not present

## 2016-11-27 DIAGNOSIS — I69311 Memory deficit following cerebral infarction: Secondary | ICD-10-CM | POA: Diagnosis not present

## 2016-11-27 DIAGNOSIS — N183 Chronic kidney disease, stage 3 (moderate): Secondary | ICD-10-CM | POA: Diagnosis not present

## 2016-11-27 DIAGNOSIS — I129 Hypertensive chronic kidney disease with stage 1 through stage 4 chronic kidney disease, or unspecified chronic kidney disease: Secondary | ICD-10-CM | POA: Diagnosis not present

## 2016-11-28 DIAGNOSIS — F0150 Vascular dementia without behavioral disturbance: Secondary | ICD-10-CM | POA: Diagnosis not present

## 2016-11-28 DIAGNOSIS — I129 Hypertensive chronic kidney disease with stage 1 through stage 4 chronic kidney disease, or unspecified chronic kidney disease: Secondary | ICD-10-CM | POA: Diagnosis not present

## 2016-11-28 DIAGNOSIS — N183 Chronic kidney disease, stage 3 (moderate): Secondary | ICD-10-CM | POA: Diagnosis not present

## 2016-11-28 DIAGNOSIS — I69311 Memory deficit following cerebral infarction: Secondary | ICD-10-CM | POA: Diagnosis not present

## 2016-11-28 DIAGNOSIS — F329 Major depressive disorder, single episode, unspecified: Secondary | ICD-10-CM | POA: Diagnosis not present

## 2016-11-28 DIAGNOSIS — I69393 Ataxia following cerebral infarction: Secondary | ICD-10-CM | POA: Diagnosis not present

## 2016-11-29 DIAGNOSIS — F329 Major depressive disorder, single episode, unspecified: Secondary | ICD-10-CM | POA: Diagnosis not present

## 2016-11-29 DIAGNOSIS — I129 Hypertensive chronic kidney disease with stage 1 through stage 4 chronic kidney disease, or unspecified chronic kidney disease: Secondary | ICD-10-CM | POA: Diagnosis not present

## 2016-11-29 DIAGNOSIS — N183 Chronic kidney disease, stage 3 (moderate): Secondary | ICD-10-CM | POA: Diagnosis not present

## 2016-11-29 DIAGNOSIS — I69311 Memory deficit following cerebral infarction: Secondary | ICD-10-CM | POA: Diagnosis not present

## 2016-11-29 DIAGNOSIS — I69393 Ataxia following cerebral infarction: Secondary | ICD-10-CM | POA: Diagnosis not present

## 2016-11-29 DIAGNOSIS — F0150 Vascular dementia without behavioral disturbance: Secondary | ICD-10-CM | POA: Diagnosis not present

## 2016-11-30 DIAGNOSIS — N183 Chronic kidney disease, stage 3 (moderate): Secondary | ICD-10-CM | POA: Diagnosis not present

## 2016-11-30 DIAGNOSIS — F0150 Vascular dementia without behavioral disturbance: Secondary | ICD-10-CM | POA: Diagnosis not present

## 2016-11-30 DIAGNOSIS — I69311 Memory deficit following cerebral infarction: Secondary | ICD-10-CM | POA: Diagnosis not present

## 2016-11-30 DIAGNOSIS — I69393 Ataxia following cerebral infarction: Secondary | ICD-10-CM | POA: Diagnosis not present

## 2016-11-30 DIAGNOSIS — I129 Hypertensive chronic kidney disease with stage 1 through stage 4 chronic kidney disease, or unspecified chronic kidney disease: Secondary | ICD-10-CM | POA: Diagnosis not present

## 2016-11-30 DIAGNOSIS — F329 Major depressive disorder, single episode, unspecified: Secondary | ICD-10-CM | POA: Diagnosis not present

## 2016-12-01 DIAGNOSIS — I69311 Memory deficit following cerebral infarction: Secondary | ICD-10-CM | POA: Diagnosis not present

## 2016-12-01 DIAGNOSIS — I69393 Ataxia following cerebral infarction: Secondary | ICD-10-CM | POA: Diagnosis not present

## 2016-12-01 DIAGNOSIS — F329 Major depressive disorder, single episode, unspecified: Secondary | ICD-10-CM | POA: Diagnosis not present

## 2016-12-01 DIAGNOSIS — F0150 Vascular dementia without behavioral disturbance: Secondary | ICD-10-CM | POA: Diagnosis not present

## 2016-12-01 DIAGNOSIS — I129 Hypertensive chronic kidney disease with stage 1 through stage 4 chronic kidney disease, or unspecified chronic kidney disease: Secondary | ICD-10-CM | POA: Diagnosis not present

## 2016-12-01 DIAGNOSIS — N183 Chronic kidney disease, stage 3 (moderate): Secondary | ICD-10-CM | POA: Diagnosis not present

## 2016-12-04 DIAGNOSIS — F0150 Vascular dementia without behavioral disturbance: Secondary | ICD-10-CM | POA: Diagnosis not present

## 2016-12-04 DIAGNOSIS — I69311 Memory deficit following cerebral infarction: Secondary | ICD-10-CM | POA: Diagnosis not present

## 2016-12-04 DIAGNOSIS — I10 Essential (primary) hypertension: Secondary | ICD-10-CM | POA: Diagnosis not present

## 2016-12-04 DIAGNOSIS — N183 Chronic kidney disease, stage 3 (moderate): Secondary | ICD-10-CM | POA: Diagnosis not present

## 2016-12-04 DIAGNOSIS — F329 Major depressive disorder, single episode, unspecified: Secondary | ICD-10-CM | POA: Diagnosis not present

## 2016-12-04 DIAGNOSIS — I129 Hypertensive chronic kidney disease with stage 1 through stage 4 chronic kidney disease, or unspecified chronic kidney disease: Secondary | ICD-10-CM | POA: Diagnosis not present

## 2016-12-04 DIAGNOSIS — R309 Painful micturition, unspecified: Secondary | ICD-10-CM | POA: Diagnosis not present

## 2016-12-04 DIAGNOSIS — N399 Disorder of urinary system, unspecified: Secondary | ICD-10-CM | POA: Diagnosis not present

## 2016-12-04 DIAGNOSIS — I69393 Ataxia following cerebral infarction: Secondary | ICD-10-CM | POA: Diagnosis not present

## 2016-12-05 DIAGNOSIS — F329 Major depressive disorder, single episode, unspecified: Secondary | ICD-10-CM | POA: Diagnosis not present

## 2016-12-05 DIAGNOSIS — N183 Chronic kidney disease, stage 3 (moderate): Secondary | ICD-10-CM | POA: Diagnosis not present

## 2016-12-05 DIAGNOSIS — I129 Hypertensive chronic kidney disease with stage 1 through stage 4 chronic kidney disease, or unspecified chronic kidney disease: Secondary | ICD-10-CM | POA: Diagnosis not present

## 2016-12-05 DIAGNOSIS — I69311 Memory deficit following cerebral infarction: Secondary | ICD-10-CM | POA: Diagnosis not present

## 2016-12-05 DIAGNOSIS — I69393 Ataxia following cerebral infarction: Secondary | ICD-10-CM | POA: Diagnosis not present

## 2016-12-05 DIAGNOSIS — F0150 Vascular dementia without behavioral disturbance: Secondary | ICD-10-CM | POA: Diagnosis not present

## 2016-12-06 DIAGNOSIS — N183 Chronic kidney disease, stage 3 (moderate): Secondary | ICD-10-CM | POA: Diagnosis not present

## 2016-12-06 DIAGNOSIS — I69393 Ataxia following cerebral infarction: Secondary | ICD-10-CM | POA: Diagnosis not present

## 2016-12-06 DIAGNOSIS — I129 Hypertensive chronic kidney disease with stage 1 through stage 4 chronic kidney disease, or unspecified chronic kidney disease: Secondary | ICD-10-CM | POA: Diagnosis not present

## 2016-12-06 DIAGNOSIS — F0150 Vascular dementia without behavioral disturbance: Secondary | ICD-10-CM | POA: Diagnosis not present

## 2016-12-06 DIAGNOSIS — I69311 Memory deficit following cerebral infarction: Secondary | ICD-10-CM | POA: Diagnosis not present

## 2016-12-06 DIAGNOSIS — F329 Major depressive disorder, single episode, unspecified: Secondary | ICD-10-CM | POA: Diagnosis not present

## 2016-12-07 DIAGNOSIS — F329 Major depressive disorder, single episode, unspecified: Secondary | ICD-10-CM | POA: Diagnosis not present

## 2016-12-07 DIAGNOSIS — F0150 Vascular dementia without behavioral disturbance: Secondary | ICD-10-CM | POA: Diagnosis not present

## 2016-12-07 DIAGNOSIS — N183 Chronic kidney disease, stage 3 (moderate): Secondary | ICD-10-CM | POA: Diagnosis not present

## 2016-12-07 DIAGNOSIS — I69311 Memory deficit following cerebral infarction: Secondary | ICD-10-CM | POA: Diagnosis not present

## 2016-12-07 DIAGNOSIS — I129 Hypertensive chronic kidney disease with stage 1 through stage 4 chronic kidney disease, or unspecified chronic kidney disease: Secondary | ICD-10-CM | POA: Diagnosis not present

## 2016-12-07 DIAGNOSIS — I69393 Ataxia following cerebral infarction: Secondary | ICD-10-CM | POA: Diagnosis not present

## 2016-12-11 DIAGNOSIS — N183 Chronic kidney disease, stage 3 (moderate): Secondary | ICD-10-CM | POA: Diagnosis not present

## 2016-12-11 DIAGNOSIS — I69393 Ataxia following cerebral infarction: Secondary | ICD-10-CM | POA: Diagnosis not present

## 2016-12-11 DIAGNOSIS — I129 Hypertensive chronic kidney disease with stage 1 through stage 4 chronic kidney disease, or unspecified chronic kidney disease: Secondary | ICD-10-CM | POA: Diagnosis not present

## 2016-12-11 DIAGNOSIS — F0150 Vascular dementia without behavioral disturbance: Secondary | ICD-10-CM | POA: Diagnosis not present

## 2016-12-11 DIAGNOSIS — F329 Major depressive disorder, single episode, unspecified: Secondary | ICD-10-CM | POA: Diagnosis not present

## 2016-12-11 DIAGNOSIS — I69311 Memory deficit following cerebral infarction: Secondary | ICD-10-CM | POA: Diagnosis not present

## 2016-12-13 DIAGNOSIS — F329 Major depressive disorder, single episode, unspecified: Secondary | ICD-10-CM | POA: Diagnosis not present

## 2016-12-13 DIAGNOSIS — I69393 Ataxia following cerebral infarction: Secondary | ICD-10-CM | POA: Diagnosis not present

## 2016-12-13 DIAGNOSIS — I129 Hypertensive chronic kidney disease with stage 1 through stage 4 chronic kidney disease, or unspecified chronic kidney disease: Secondary | ICD-10-CM | POA: Diagnosis not present

## 2016-12-13 DIAGNOSIS — F0150 Vascular dementia without behavioral disturbance: Secondary | ICD-10-CM | POA: Diagnosis not present

## 2016-12-13 DIAGNOSIS — I69311 Memory deficit following cerebral infarction: Secondary | ICD-10-CM | POA: Diagnosis not present

## 2016-12-13 DIAGNOSIS — N183 Chronic kidney disease, stage 3 (moderate): Secondary | ICD-10-CM | POA: Diagnosis not present

## 2016-12-14 DIAGNOSIS — N183 Chronic kidney disease, stage 3 (moderate): Secondary | ICD-10-CM | POA: Diagnosis not present

## 2016-12-14 DIAGNOSIS — F329 Major depressive disorder, single episode, unspecified: Secondary | ICD-10-CM | POA: Diagnosis not present

## 2016-12-14 DIAGNOSIS — I129 Hypertensive chronic kidney disease with stage 1 through stage 4 chronic kidney disease, or unspecified chronic kidney disease: Secondary | ICD-10-CM | POA: Diagnosis not present

## 2016-12-14 DIAGNOSIS — I69393 Ataxia following cerebral infarction: Secondary | ICD-10-CM | POA: Diagnosis not present

## 2016-12-14 DIAGNOSIS — F0150 Vascular dementia without behavioral disturbance: Secondary | ICD-10-CM | POA: Diagnosis not present

## 2016-12-14 DIAGNOSIS — I69311 Memory deficit following cerebral infarction: Secondary | ICD-10-CM | POA: Diagnosis not present

## 2016-12-15 ENCOUNTER — Telehealth: Payer: Self-pay | Admitting: Internal Medicine

## 2016-12-15 NOTE — Telephone Encounter (Signed)
I spoke with the patient and explained that she's due for a follow-up visit with Dr. Nyoka Cowden.  She stated that she will have the person who brings her to call and make the appointment. VDM (DD)

## 2016-12-16 DIAGNOSIS — I129 Hypertensive chronic kidney disease with stage 1 through stage 4 chronic kidney disease, or unspecified chronic kidney disease: Secondary | ICD-10-CM | POA: Diagnosis not present

## 2016-12-16 DIAGNOSIS — F329 Major depressive disorder, single episode, unspecified: Secondary | ICD-10-CM | POA: Diagnosis not present

## 2016-12-16 DIAGNOSIS — N183 Chronic kidney disease, stage 3 (moderate): Secondary | ICD-10-CM | POA: Diagnosis not present

## 2016-12-16 DIAGNOSIS — I69393 Ataxia following cerebral infarction: Secondary | ICD-10-CM | POA: Diagnosis not present

## 2016-12-16 DIAGNOSIS — I69311 Memory deficit following cerebral infarction: Secondary | ICD-10-CM | POA: Diagnosis not present

## 2016-12-16 DIAGNOSIS — F0150 Vascular dementia without behavioral disturbance: Secondary | ICD-10-CM | POA: Diagnosis not present

## 2016-12-18 DIAGNOSIS — I69393 Ataxia following cerebral infarction: Secondary | ICD-10-CM | POA: Diagnosis not present

## 2016-12-18 DIAGNOSIS — F0150 Vascular dementia without behavioral disturbance: Secondary | ICD-10-CM | POA: Diagnosis not present

## 2016-12-18 DIAGNOSIS — I129 Hypertensive chronic kidney disease with stage 1 through stage 4 chronic kidney disease, or unspecified chronic kidney disease: Secondary | ICD-10-CM | POA: Diagnosis not present

## 2016-12-18 DIAGNOSIS — I69311 Memory deficit following cerebral infarction: Secondary | ICD-10-CM | POA: Diagnosis not present

## 2016-12-18 DIAGNOSIS — N183 Chronic kidney disease, stage 3 (moderate): Secondary | ICD-10-CM | POA: Diagnosis not present

## 2016-12-18 DIAGNOSIS — F329 Major depressive disorder, single episode, unspecified: Secondary | ICD-10-CM | POA: Diagnosis not present

## 2016-12-19 DIAGNOSIS — N183 Chronic kidney disease, stage 3 (moderate): Secondary | ICD-10-CM | POA: Diagnosis not present

## 2016-12-19 DIAGNOSIS — F329 Major depressive disorder, single episode, unspecified: Secondary | ICD-10-CM | POA: Diagnosis not present

## 2016-12-19 DIAGNOSIS — I129 Hypertensive chronic kidney disease with stage 1 through stage 4 chronic kidney disease, or unspecified chronic kidney disease: Secondary | ICD-10-CM | POA: Diagnosis not present

## 2016-12-19 DIAGNOSIS — I69311 Memory deficit following cerebral infarction: Secondary | ICD-10-CM | POA: Diagnosis not present

## 2016-12-19 DIAGNOSIS — I69393 Ataxia following cerebral infarction: Secondary | ICD-10-CM | POA: Diagnosis not present

## 2016-12-19 DIAGNOSIS — F0150 Vascular dementia without behavioral disturbance: Secondary | ICD-10-CM | POA: Diagnosis not present

## 2016-12-20 DIAGNOSIS — I69393 Ataxia following cerebral infarction: Secondary | ICD-10-CM | POA: Diagnosis not present

## 2016-12-20 DIAGNOSIS — I69311 Memory deficit following cerebral infarction: Secondary | ICD-10-CM | POA: Diagnosis not present

## 2016-12-20 DIAGNOSIS — F0150 Vascular dementia without behavioral disturbance: Secondary | ICD-10-CM | POA: Diagnosis not present

## 2016-12-20 DIAGNOSIS — N183 Chronic kidney disease, stage 3 (moderate): Secondary | ICD-10-CM | POA: Diagnosis not present

## 2016-12-20 DIAGNOSIS — I129 Hypertensive chronic kidney disease with stage 1 through stage 4 chronic kidney disease, or unspecified chronic kidney disease: Secondary | ICD-10-CM | POA: Diagnosis not present

## 2016-12-20 DIAGNOSIS — F329 Major depressive disorder, single episode, unspecified: Secondary | ICD-10-CM | POA: Diagnosis not present

## 2016-12-21 DIAGNOSIS — I129 Hypertensive chronic kidney disease with stage 1 through stage 4 chronic kidney disease, or unspecified chronic kidney disease: Secondary | ICD-10-CM | POA: Diagnosis not present

## 2016-12-21 DIAGNOSIS — I69311 Memory deficit following cerebral infarction: Secondary | ICD-10-CM | POA: Diagnosis not present

## 2016-12-21 DIAGNOSIS — F0150 Vascular dementia without behavioral disturbance: Secondary | ICD-10-CM | POA: Diagnosis not present

## 2016-12-21 DIAGNOSIS — F329 Major depressive disorder, single episode, unspecified: Secondary | ICD-10-CM | POA: Diagnosis not present

## 2016-12-21 DIAGNOSIS — N183 Chronic kidney disease, stage 3 (moderate): Secondary | ICD-10-CM | POA: Diagnosis not present

## 2016-12-21 DIAGNOSIS — I69393 Ataxia following cerebral infarction: Secondary | ICD-10-CM | POA: Diagnosis not present

## 2016-12-24 DIAGNOSIS — N183 Chronic kidney disease, stage 3 (moderate): Secondary | ICD-10-CM | POA: Diagnosis not present

## 2016-12-24 DIAGNOSIS — F329 Major depressive disorder, single episode, unspecified: Secondary | ICD-10-CM | POA: Diagnosis not present

## 2016-12-24 DIAGNOSIS — I69393 Ataxia following cerebral infarction: Secondary | ICD-10-CM | POA: Diagnosis not present

## 2016-12-24 DIAGNOSIS — I69311 Memory deficit following cerebral infarction: Secondary | ICD-10-CM | POA: Diagnosis not present

## 2016-12-24 DIAGNOSIS — I129 Hypertensive chronic kidney disease with stage 1 through stage 4 chronic kidney disease, or unspecified chronic kidney disease: Secondary | ICD-10-CM | POA: Diagnosis not present

## 2016-12-24 DIAGNOSIS — F0150 Vascular dementia without behavioral disturbance: Secondary | ICD-10-CM | POA: Diagnosis not present

## 2016-12-25 DIAGNOSIS — I69311 Memory deficit following cerebral infarction: Secondary | ICD-10-CM | POA: Diagnosis not present

## 2016-12-25 DIAGNOSIS — F329 Major depressive disorder, single episode, unspecified: Secondary | ICD-10-CM | POA: Diagnosis not present

## 2016-12-25 DIAGNOSIS — I129 Hypertensive chronic kidney disease with stage 1 through stage 4 chronic kidney disease, or unspecified chronic kidney disease: Secondary | ICD-10-CM | POA: Diagnosis not present

## 2016-12-25 DIAGNOSIS — N183 Chronic kidney disease, stage 3 (moderate): Secondary | ICD-10-CM | POA: Diagnosis not present

## 2016-12-25 DIAGNOSIS — I69393 Ataxia following cerebral infarction: Secondary | ICD-10-CM | POA: Diagnosis not present

## 2016-12-25 DIAGNOSIS — F0150 Vascular dementia without behavioral disturbance: Secondary | ICD-10-CM | POA: Diagnosis not present

## 2016-12-27 DIAGNOSIS — N183 Chronic kidney disease, stage 3 (moderate): Secondary | ICD-10-CM | POA: Diagnosis not present

## 2016-12-27 DIAGNOSIS — F329 Major depressive disorder, single episode, unspecified: Secondary | ICD-10-CM | POA: Diagnosis not present

## 2016-12-27 DIAGNOSIS — I69311 Memory deficit following cerebral infarction: Secondary | ICD-10-CM | POA: Diagnosis not present

## 2016-12-27 DIAGNOSIS — I129 Hypertensive chronic kidney disease with stage 1 through stage 4 chronic kidney disease, or unspecified chronic kidney disease: Secondary | ICD-10-CM | POA: Diagnosis not present

## 2016-12-27 DIAGNOSIS — F0150 Vascular dementia without behavioral disturbance: Secondary | ICD-10-CM | POA: Diagnosis not present

## 2016-12-27 DIAGNOSIS — I69393 Ataxia following cerebral infarction: Secondary | ICD-10-CM | POA: Diagnosis not present

## 2016-12-28 ENCOUNTER — Ambulatory Visit (INDEPENDENT_AMBULATORY_CARE_PROVIDER_SITE_OTHER): Payer: Medicare Other | Admitting: Ophthalmology

## 2016-12-28 DIAGNOSIS — I69311 Memory deficit following cerebral infarction: Secondary | ICD-10-CM | POA: Diagnosis not present

## 2016-12-28 DIAGNOSIS — N183 Chronic kidney disease, stage 3 (moderate): Secondary | ICD-10-CM | POA: Diagnosis not present

## 2016-12-28 DIAGNOSIS — F329 Major depressive disorder, single episode, unspecified: Secondary | ICD-10-CM | POA: Diagnosis not present

## 2016-12-28 DIAGNOSIS — I69393 Ataxia following cerebral infarction: Secondary | ICD-10-CM | POA: Diagnosis not present

## 2016-12-28 DIAGNOSIS — F0150 Vascular dementia without behavioral disturbance: Secondary | ICD-10-CM | POA: Diagnosis not present

## 2016-12-28 DIAGNOSIS — I129 Hypertensive chronic kidney disease with stage 1 through stage 4 chronic kidney disease, or unspecified chronic kidney disease: Secondary | ICD-10-CM | POA: Diagnosis not present

## 2017-01-03 DIAGNOSIS — I69311 Memory deficit following cerebral infarction: Secondary | ICD-10-CM | POA: Diagnosis not present

## 2017-01-03 DIAGNOSIS — I129 Hypertensive chronic kidney disease with stage 1 through stage 4 chronic kidney disease, or unspecified chronic kidney disease: Secondary | ICD-10-CM | POA: Diagnosis not present

## 2017-01-03 DIAGNOSIS — F0150 Vascular dementia without behavioral disturbance: Secondary | ICD-10-CM | POA: Diagnosis not present

## 2017-01-03 DIAGNOSIS — N183 Chronic kidney disease, stage 3 (moderate): Secondary | ICD-10-CM | POA: Diagnosis not present

## 2017-01-03 DIAGNOSIS — I69393 Ataxia following cerebral infarction: Secondary | ICD-10-CM | POA: Diagnosis not present

## 2017-01-03 DIAGNOSIS — F329 Major depressive disorder, single episode, unspecified: Secondary | ICD-10-CM | POA: Diagnosis not present

## 2017-01-08 DIAGNOSIS — I129 Hypertensive chronic kidney disease with stage 1 through stage 4 chronic kidney disease, or unspecified chronic kidney disease: Secondary | ICD-10-CM | POA: Diagnosis not present

## 2017-01-08 DIAGNOSIS — I69311 Memory deficit following cerebral infarction: Secondary | ICD-10-CM | POA: Diagnosis not present

## 2017-01-08 DIAGNOSIS — F0150 Vascular dementia without behavioral disturbance: Secondary | ICD-10-CM | POA: Diagnosis not present

## 2017-01-08 DIAGNOSIS — I69393 Ataxia following cerebral infarction: Secondary | ICD-10-CM | POA: Diagnosis not present

## 2017-01-08 DIAGNOSIS — F329 Major depressive disorder, single episode, unspecified: Secondary | ICD-10-CM | POA: Diagnosis not present

## 2017-01-08 DIAGNOSIS — N183 Chronic kidney disease, stage 3 (moderate): Secondary | ICD-10-CM | POA: Diagnosis not present

## 2017-01-10 DIAGNOSIS — H8110 Benign paroxysmal vertigo, unspecified ear: Secondary | ICD-10-CM | POA: Diagnosis not present

## 2017-01-10 DIAGNOSIS — I10 Essential (primary) hypertension: Secondary | ICD-10-CM | POA: Diagnosis not present

## 2017-01-10 DIAGNOSIS — K5901 Slow transit constipation: Secondary | ICD-10-CM | POA: Diagnosis not present

## 2017-01-10 DIAGNOSIS — R52 Pain, unspecified: Secondary | ICD-10-CM | POA: Diagnosis not present

## 2017-01-10 DIAGNOSIS — R110 Nausea: Secondary | ICD-10-CM | POA: Diagnosis not present

## 2017-01-15 DIAGNOSIS — N183 Chronic kidney disease, stage 3 (moderate): Secondary | ICD-10-CM | POA: Diagnosis not present

## 2017-01-15 DIAGNOSIS — I69311 Memory deficit following cerebral infarction: Secondary | ICD-10-CM | POA: Diagnosis not present

## 2017-01-15 DIAGNOSIS — F0150 Vascular dementia without behavioral disturbance: Secondary | ICD-10-CM | POA: Diagnosis not present

## 2017-01-15 DIAGNOSIS — I69393 Ataxia following cerebral infarction: Secondary | ICD-10-CM | POA: Diagnosis not present

## 2017-01-15 DIAGNOSIS — I129 Hypertensive chronic kidney disease with stage 1 through stage 4 chronic kidney disease, or unspecified chronic kidney disease: Secondary | ICD-10-CM | POA: Diagnosis not present

## 2017-01-15 DIAGNOSIS — F329 Major depressive disorder, single episode, unspecified: Secondary | ICD-10-CM | POA: Diagnosis not present

## 2017-01-24 DIAGNOSIS — I69393 Ataxia following cerebral infarction: Secondary | ICD-10-CM | POA: Diagnosis not present

## 2017-01-24 DIAGNOSIS — N183 Chronic kidney disease, stage 3 (moderate): Secondary | ICD-10-CM | POA: Diagnosis not present

## 2017-01-24 DIAGNOSIS — I129 Hypertensive chronic kidney disease with stage 1 through stage 4 chronic kidney disease, or unspecified chronic kidney disease: Secondary | ICD-10-CM | POA: Diagnosis not present

## 2017-01-24 DIAGNOSIS — F0150 Vascular dementia without behavioral disturbance: Secondary | ICD-10-CM | POA: Diagnosis not present

## 2017-01-24 DIAGNOSIS — F329 Major depressive disorder, single episode, unspecified: Secondary | ICD-10-CM | POA: Diagnosis not present

## 2017-01-24 DIAGNOSIS — I69311 Memory deficit following cerebral infarction: Secondary | ICD-10-CM | POA: Diagnosis not present

## 2017-01-29 DIAGNOSIS — F329 Major depressive disorder, single episode, unspecified: Secondary | ICD-10-CM | POA: Diagnosis not present

## 2017-01-29 DIAGNOSIS — I129 Hypertensive chronic kidney disease with stage 1 through stage 4 chronic kidney disease, or unspecified chronic kidney disease: Secondary | ICD-10-CM | POA: Diagnosis not present

## 2017-01-29 DIAGNOSIS — I69315 Cognitive social or emotional deficit following cerebral infarction: Secondary | ICD-10-CM | POA: Diagnosis not present

## 2017-01-29 DIAGNOSIS — F0150 Vascular dementia without behavioral disturbance: Secondary | ICD-10-CM | POA: Diagnosis not present

## 2017-01-29 DIAGNOSIS — N183 Chronic kidney disease, stage 3 (moderate): Secondary | ICD-10-CM | POA: Diagnosis not present

## 2017-01-29 DIAGNOSIS — I69393 Ataxia following cerebral infarction: Secondary | ICD-10-CM | POA: Diagnosis not present

## 2017-01-30 DIAGNOSIS — I69315 Cognitive social or emotional deficit following cerebral infarction: Secondary | ICD-10-CM | POA: Diagnosis not present

## 2017-01-30 DIAGNOSIS — N183 Chronic kidney disease, stage 3 (moderate): Secondary | ICD-10-CM | POA: Diagnosis not present

## 2017-01-30 DIAGNOSIS — F0150 Vascular dementia without behavioral disturbance: Secondary | ICD-10-CM | POA: Diagnosis not present

## 2017-01-30 DIAGNOSIS — F329 Major depressive disorder, single episode, unspecified: Secondary | ICD-10-CM | POA: Diagnosis not present

## 2017-01-30 DIAGNOSIS — I69393 Ataxia following cerebral infarction: Secondary | ICD-10-CM | POA: Diagnosis not present

## 2017-01-30 DIAGNOSIS — I129 Hypertensive chronic kidney disease with stage 1 through stage 4 chronic kidney disease, or unspecified chronic kidney disease: Secondary | ICD-10-CM | POA: Diagnosis not present

## 2017-02-01 DIAGNOSIS — F0150 Vascular dementia without behavioral disturbance: Secondary | ICD-10-CM | POA: Diagnosis not present

## 2017-02-01 DIAGNOSIS — F329 Major depressive disorder, single episode, unspecified: Secondary | ICD-10-CM | POA: Diagnosis not present

## 2017-02-01 DIAGNOSIS — N183 Chronic kidney disease, stage 3 (moderate): Secondary | ICD-10-CM | POA: Diagnosis not present

## 2017-02-01 DIAGNOSIS — I69315 Cognitive social or emotional deficit following cerebral infarction: Secondary | ICD-10-CM | POA: Diagnosis not present

## 2017-02-01 DIAGNOSIS — I129 Hypertensive chronic kidney disease with stage 1 through stage 4 chronic kidney disease, or unspecified chronic kidney disease: Secondary | ICD-10-CM | POA: Diagnosis not present

## 2017-02-01 DIAGNOSIS — I69393 Ataxia following cerebral infarction: Secondary | ICD-10-CM | POA: Diagnosis not present

## 2017-02-05 DIAGNOSIS — I129 Hypertensive chronic kidney disease with stage 1 through stage 4 chronic kidney disease, or unspecified chronic kidney disease: Secondary | ICD-10-CM | POA: Diagnosis not present

## 2017-02-05 DIAGNOSIS — I69315 Cognitive social or emotional deficit following cerebral infarction: Secondary | ICD-10-CM | POA: Diagnosis not present

## 2017-02-05 DIAGNOSIS — F329 Major depressive disorder, single episode, unspecified: Secondary | ICD-10-CM | POA: Diagnosis not present

## 2017-02-05 DIAGNOSIS — I69393 Ataxia following cerebral infarction: Secondary | ICD-10-CM | POA: Diagnosis not present

## 2017-02-05 DIAGNOSIS — N183 Chronic kidney disease, stage 3 (moderate): Secondary | ICD-10-CM | POA: Diagnosis not present

## 2017-02-05 DIAGNOSIS — F0150 Vascular dementia without behavioral disturbance: Secondary | ICD-10-CM | POA: Diagnosis not present

## 2017-02-06 DIAGNOSIS — I69315 Cognitive social or emotional deficit following cerebral infarction: Secondary | ICD-10-CM | POA: Diagnosis not present

## 2017-02-06 DIAGNOSIS — F329 Major depressive disorder, single episode, unspecified: Secondary | ICD-10-CM | POA: Diagnosis not present

## 2017-02-06 DIAGNOSIS — I69393 Ataxia following cerebral infarction: Secondary | ICD-10-CM | POA: Diagnosis not present

## 2017-02-06 DIAGNOSIS — N183 Chronic kidney disease, stage 3 (moderate): Secondary | ICD-10-CM | POA: Diagnosis not present

## 2017-02-06 DIAGNOSIS — F0150 Vascular dementia without behavioral disturbance: Secondary | ICD-10-CM | POA: Diagnosis not present

## 2017-02-06 DIAGNOSIS — I129 Hypertensive chronic kidney disease with stage 1 through stage 4 chronic kidney disease, or unspecified chronic kidney disease: Secondary | ICD-10-CM | POA: Diagnosis not present

## 2017-02-07 DIAGNOSIS — I69393 Ataxia following cerebral infarction: Secondary | ICD-10-CM | POA: Diagnosis not present

## 2017-02-07 DIAGNOSIS — F329 Major depressive disorder, single episode, unspecified: Secondary | ICD-10-CM | POA: Diagnosis not present

## 2017-02-07 DIAGNOSIS — F0150 Vascular dementia without behavioral disturbance: Secondary | ICD-10-CM | POA: Diagnosis not present

## 2017-02-07 DIAGNOSIS — I69315 Cognitive social or emotional deficit following cerebral infarction: Secondary | ICD-10-CM | POA: Diagnosis not present

## 2017-02-07 DIAGNOSIS — I129 Hypertensive chronic kidney disease with stage 1 through stage 4 chronic kidney disease, or unspecified chronic kidney disease: Secondary | ICD-10-CM | POA: Diagnosis not present

## 2017-02-07 DIAGNOSIS — G629 Polyneuropathy, unspecified: Secondary | ICD-10-CM | POA: Diagnosis not present

## 2017-02-07 DIAGNOSIS — N183 Chronic kidney disease, stage 3 (moderate): Secondary | ICD-10-CM | POA: Diagnosis not present

## 2017-02-07 DIAGNOSIS — H8111 Benign paroxysmal vertigo, right ear: Secondary | ICD-10-CM | POA: Diagnosis not present

## 2017-02-12 DIAGNOSIS — F329 Major depressive disorder, single episode, unspecified: Secondary | ICD-10-CM | POA: Diagnosis not present

## 2017-02-12 DIAGNOSIS — N183 Chronic kidney disease, stage 3 (moderate): Secondary | ICD-10-CM | POA: Diagnosis not present

## 2017-02-12 DIAGNOSIS — I69315 Cognitive social or emotional deficit following cerebral infarction: Secondary | ICD-10-CM | POA: Diagnosis not present

## 2017-02-12 DIAGNOSIS — F0150 Vascular dementia without behavioral disturbance: Secondary | ICD-10-CM | POA: Diagnosis not present

## 2017-02-12 DIAGNOSIS — I129 Hypertensive chronic kidney disease with stage 1 through stage 4 chronic kidney disease, or unspecified chronic kidney disease: Secondary | ICD-10-CM | POA: Diagnosis not present

## 2017-02-12 DIAGNOSIS — I69393 Ataxia following cerebral infarction: Secondary | ICD-10-CM | POA: Diagnosis not present

## 2017-02-13 DIAGNOSIS — I129 Hypertensive chronic kidney disease with stage 1 through stage 4 chronic kidney disease, or unspecified chronic kidney disease: Secondary | ICD-10-CM | POA: Diagnosis not present

## 2017-02-13 DIAGNOSIS — F0150 Vascular dementia without behavioral disturbance: Secondary | ICD-10-CM | POA: Diagnosis not present

## 2017-02-13 DIAGNOSIS — I69315 Cognitive social or emotional deficit following cerebral infarction: Secondary | ICD-10-CM | POA: Diagnosis not present

## 2017-02-13 DIAGNOSIS — N183 Chronic kidney disease, stage 3 (moderate): Secondary | ICD-10-CM | POA: Diagnosis not present

## 2017-02-13 DIAGNOSIS — F329 Major depressive disorder, single episode, unspecified: Secondary | ICD-10-CM | POA: Diagnosis not present

## 2017-02-13 DIAGNOSIS — I69393 Ataxia following cerebral infarction: Secondary | ICD-10-CM | POA: Diagnosis not present

## 2017-02-14 DIAGNOSIS — I129 Hypertensive chronic kidney disease with stage 1 through stage 4 chronic kidney disease, or unspecified chronic kidney disease: Secondary | ICD-10-CM | POA: Diagnosis not present

## 2017-02-14 DIAGNOSIS — I69315 Cognitive social or emotional deficit following cerebral infarction: Secondary | ICD-10-CM | POA: Diagnosis not present

## 2017-02-14 DIAGNOSIS — F0150 Vascular dementia without behavioral disturbance: Secondary | ICD-10-CM | POA: Diagnosis not present

## 2017-02-14 DIAGNOSIS — N183 Chronic kidney disease, stage 3 (moderate): Secondary | ICD-10-CM | POA: Diagnosis not present

## 2017-02-14 DIAGNOSIS — I69393 Ataxia following cerebral infarction: Secondary | ICD-10-CM | POA: Diagnosis not present

## 2017-02-14 DIAGNOSIS — F329 Major depressive disorder, single episode, unspecified: Secondary | ICD-10-CM | POA: Diagnosis not present

## 2017-02-20 ENCOUNTER — Encounter: Payer: Self-pay | Admitting: Nurse Practitioner

## 2017-02-20 ENCOUNTER — Ambulatory Visit (INDEPENDENT_AMBULATORY_CARE_PROVIDER_SITE_OTHER): Payer: Medicare Other | Admitting: Nurse Practitioner

## 2017-02-20 VITALS — BP 118/70 | HR 60 | Ht 61.0 in | Wt 144.0 lb

## 2017-02-20 DIAGNOSIS — R110 Nausea: Secondary | ICD-10-CM

## 2017-02-20 DIAGNOSIS — I69393 Ataxia following cerebral infarction: Secondary | ICD-10-CM | POA: Diagnosis not present

## 2017-02-20 DIAGNOSIS — N183 Chronic kidney disease, stage 3 (moderate): Secondary | ICD-10-CM | POA: Diagnosis not present

## 2017-02-20 DIAGNOSIS — D649 Anemia, unspecified: Secondary | ICD-10-CM | POA: Diagnosis not present

## 2017-02-20 DIAGNOSIS — N2581 Secondary hyperparathyroidism of renal origin: Secondary | ICD-10-CM | POA: Diagnosis not present

## 2017-02-20 DIAGNOSIS — I129 Hypertensive chronic kidney disease with stage 1 through stage 4 chronic kidney disease, or unspecified chronic kidney disease: Secondary | ICD-10-CM | POA: Diagnosis not present

## 2017-02-20 DIAGNOSIS — F0150 Vascular dementia without behavioral disturbance: Secondary | ICD-10-CM | POA: Diagnosis not present

## 2017-02-20 DIAGNOSIS — F329 Major depressive disorder, single episode, unspecified: Secondary | ICD-10-CM | POA: Diagnosis not present

## 2017-02-20 DIAGNOSIS — D631 Anemia in chronic kidney disease: Secondary | ICD-10-CM | POA: Diagnosis not present

## 2017-02-20 DIAGNOSIS — I69315 Cognitive social or emotional deficit following cerebral infarction: Secondary | ICD-10-CM | POA: Diagnosis not present

## 2017-02-20 NOTE — Patient Instructions (Signed)
If you are age 81 or older, your body mass index should be between 23-30. Your Body mass index is 27.21 kg/m. If this is out of the aforementioned range listed, please consider follow up with your Primary Care Provider.  If you are age 14 or younger, your body mass index should be between 19-25. Your Body mass index is 27.21 kg/m. If this is out of the aformentioned range listed, please consider follow up with your Primary Care Provider.   Will call patient with lab results.  EGD pending CBC results.  Thank you for choosing me and [facility].  Tye Savoy, NP

## 2017-02-20 NOTE — Progress Notes (Signed)
HPI: Patient is an 83 year old female who saw Dr. Ardis Hughs in November 2016 for altered bowel habits. She has had alternating constipation / loose stools for at least two years. She was taking, or had tried multiple things such as benefiber, miralax, bentyl, imodium, etc... We discontinued all these and started her on Citrucel. Somewhere along the way she was restarted on MiraLAX. She goes through periods where bowels move okay then constipation and/or diarrhea sets in. Patient also has chronic nausea, she took Phenergan for years but PCP recently stopped it out of concern for fall risk / age. Since stopping the Phenergan her nausea is much worse.   Patient is referred by PCP, Alvester Chou, DNP,  for evaluation of black stools. She describes intermittent black stools over the last couple weeks. She has recently taken some bismuth but details about when are unclear.Her hemoglobin in December was 11.4, it was 9.8 on 02/07/17. MCV 79. B12 in Dec was low at 226. No NSAIDS. Nausea worse since Phenergan stopped in December. Zofran doesn't work as well.  She has chronic vertigo. She isn't unusually SOB. No chest pain   Past Medical History:  Diagnosis Date  . Atrophy of vulva   . Carotid stenosis   . Chronic kidney disease   . Depression   . Dermatophytosis of nail   . DVT (deep venous thrombosis) ([place]) 01/06/2014   RLE  . Dysuria   . GERD (gastroesophageal reflux disease)   . Hyperglycemia 04/18/2016  . Hyperlipidemia   . Hyperpotassemia   . Hypertension   . Macular degeneration    "left eye; growth behind that" (01/06/2014)  . Mild cognitive impairment, so stated   . Nephrolithiasis   . Osteoporosis, unspecified   . Other B-complex deficiencies   . Other diseases of larynx   . Other voice and resonance disorders   . Reflux esophagitis   . Seizures ([place])    "one, years ago" (01/06/2014)  . Unspecified hereditary and idiopathic peripheral neuropathy   . Unspecified malignant neoplasm of skin,  site unspecified    left foot  . Unspecified urinary incontinence   . Vitamin deficiency     Patient's surgical history, family medical history, social history, medications and allergies were all reviewed in Epic    Physical Exam: BP 118/70   Pulse 60   Ht '5\' 1"'$  (1.549 m)   Wt 144 lb (65.3 kg)   BMI 27.21 kg/m   GENERAL:  Well developed elderly white female in NAD PSYCH: :Pleasant, cooperative, normal affect EENT:  conjunctiva pink, mucous membranes moist, neck supple without masses CARDIAC:  RRR, no murmur heard,  No peripheral edema PULM: Normal respiratory effort, lungs CTA bilaterally, no wheezing ABDOMEN:  soft, nontender, nondistended, no obvious masses,   normal bowel sounds RECTAl: light brown, heme negative stool in vault.  SKIN:  Excessively dry with poor turgor Musculoskeletal:  Adequate muscle tone, adequate strength NEURO: Alert and oriented x 3, no focal neurologic deficits   ASSESSMENT and PLAN:  1. 83 yo female with complaints of intermittent black stools for two weeks and drop in hgb of almost two grams since December.  - On exam stool medium brown / heme negative.  Of course we still need an explanation for the fall in hgb from 11.4 in Dec to 9.8 on 02/07/17. A repeat CBC at [facility] is pending. If hgb has truly dropped then she will need an EGD. Otherwise, her nausea is chronic, probably worse as consequence of Phenergan  being stopped. Her stools may have been black because of the bismuth.   -Will call Niece with CBC results when I receive them.  -continue PPI -she has CKD which can contribute to chronic anemia     2. Chronic vertigo, this may be contributing to chronic nausea.   3. Chronic constipation /  diarrhea. Sounds like predominant problem is that of constipation. She required disimpaction in Feb 2017. Her symptoms wax and wane and haven't changed much to suggest development of new problem. Colonoscopy discussed at one of her previous visits. I don't  think it would change management of her alternating bowel patterns but may need to be done as part of anemia workup if hgb comes back low.   4. B12 deficiency. Level 226. On supplement.   5. CKD, stage 3 by June 2017 labs. Followed by Nephrology  Tye Savoy , NP 02/20/2017, 2:55 PM

## 2017-02-22 DIAGNOSIS — F329 Major depressive disorder, single episode, unspecified: Secondary | ICD-10-CM | POA: Diagnosis not present

## 2017-02-22 DIAGNOSIS — I69393 Ataxia following cerebral infarction: Secondary | ICD-10-CM | POA: Diagnosis not present

## 2017-02-22 DIAGNOSIS — I69315 Cognitive social or emotional deficit following cerebral infarction: Secondary | ICD-10-CM | POA: Diagnosis not present

## 2017-02-22 DIAGNOSIS — I129 Hypertensive chronic kidney disease with stage 1 through stage 4 chronic kidney disease, or unspecified chronic kidney disease: Secondary | ICD-10-CM | POA: Diagnosis not present

## 2017-02-22 DIAGNOSIS — N183 Chronic kidney disease, stage 3 (moderate): Secondary | ICD-10-CM | POA: Diagnosis not present

## 2017-02-22 DIAGNOSIS — F0150 Vascular dementia without behavioral disturbance: Secondary | ICD-10-CM | POA: Diagnosis not present

## 2017-02-22 NOTE — Progress Notes (Signed)
I agree with the above note, plan 

## 2017-02-23 NOTE — Progress Notes (Addendum)
  Got labs from [facility] drawn 02/20/17, hgb 10.1. This is a drop in hgb but patient had no evidence for bleeding when I saw her in clinic. Will ask that she come for repeat CBC next week.    I never did get results. We are still trying to obtain CBC from [facility]

## 2017-02-26 ENCOUNTER — Telehealth: Payer: Self-pay

## 2017-03-05 DIAGNOSIS — N2581 Secondary hyperparathyroidism of renal origin: Secondary | ICD-10-CM | POA: Diagnosis not present

## 2017-03-05 DIAGNOSIS — N183 Chronic kidney disease, stage 3 (moderate): Secondary | ICD-10-CM | POA: Diagnosis not present

## 2017-03-05 DIAGNOSIS — D631 Anemia in chronic kidney disease: Secondary | ICD-10-CM | POA: Diagnosis not present

## 2017-03-05 DIAGNOSIS — N343 Urethral syndrome, unspecified: Secondary | ICD-10-CM | POA: Diagnosis not present

## 2017-03-05 DIAGNOSIS — I129 Hypertensive chronic kidney disease with stage 1 through stage 4 chronic kidney disease, or unspecified chronic kidney disease: Secondary | ICD-10-CM | POA: Diagnosis not present

## 2017-03-06 ENCOUNTER — Other Ambulatory Visit: Payer: Self-pay

## 2017-03-08 DIAGNOSIS — I69315 Cognitive social or emotional deficit following cerebral infarction: Secondary | ICD-10-CM | POA: Diagnosis not present

## 2017-03-08 DIAGNOSIS — I129 Hypertensive chronic kidney disease with stage 1 through stage 4 chronic kidney disease, or unspecified chronic kidney disease: Secondary | ICD-10-CM | POA: Diagnosis not present

## 2017-03-08 DIAGNOSIS — F329 Major depressive disorder, single episode, unspecified: Secondary | ICD-10-CM | POA: Diagnosis not present

## 2017-03-08 DIAGNOSIS — I69393 Ataxia following cerebral infarction: Secondary | ICD-10-CM | POA: Diagnosis not present

## 2017-03-08 DIAGNOSIS — F0150 Vascular dementia without behavioral disturbance: Secondary | ICD-10-CM | POA: Diagnosis not present

## 2017-03-08 DIAGNOSIS — N183 Chronic kidney disease, stage 3 (moderate): Secondary | ICD-10-CM | POA: Diagnosis not present

## 2017-03-22 DIAGNOSIS — I129 Hypertensive chronic kidney disease with stage 1 through stage 4 chronic kidney disease, or unspecified chronic kidney disease: Secondary | ICD-10-CM | POA: Diagnosis not present

## 2017-03-22 DIAGNOSIS — F329 Major depressive disorder, single episode, unspecified: Secondary | ICD-10-CM | POA: Diagnosis not present

## 2017-03-22 DIAGNOSIS — I69393 Ataxia following cerebral infarction: Secondary | ICD-10-CM | POA: Diagnosis not present

## 2017-03-22 DIAGNOSIS — F0150 Vascular dementia without behavioral disturbance: Secondary | ICD-10-CM | POA: Diagnosis not present

## 2017-03-22 DIAGNOSIS — I69315 Cognitive social or emotional deficit following cerebral infarction: Secondary | ICD-10-CM | POA: Diagnosis not present

## 2017-03-22 DIAGNOSIS — N183 Chronic kidney disease, stage 3 (moderate): Secondary | ICD-10-CM | POA: Diagnosis not present

## 2017-03-22 IMAGING — CT CT ABD-PELV W/O CM
2 of 4 series · 10 of 46 positions shown, 11 images · non-contrast
Comparison: None.

CLINICAL DATA: Chronic constipation. Increasing abdominal pain and
constipation for 2 weeks.

EXAM:
CT ABDOMEN AND PELVIS WITHOUT CONTRAST
TECHNIQUE: Multidetector CT imaging of the abdomen and pelvis was performed
following the standard protocol without IV contrast.

[Series 201: routine, idose (2) · axial · 0.83mm/px · z∈[+46,+431]mm · 7 of 93 slices shown, 8 images]
[im 8/93  soft-tissue]
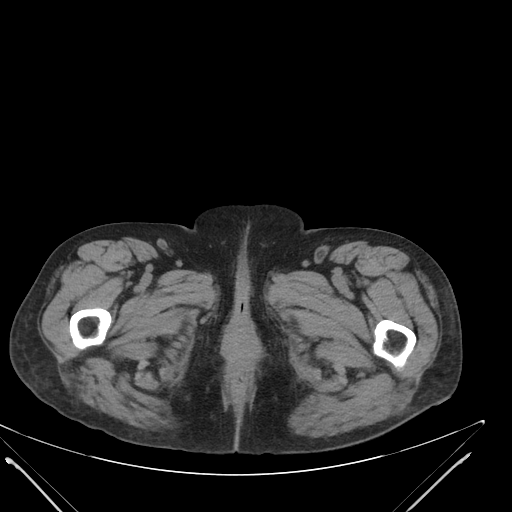
[im 8/93  bone]
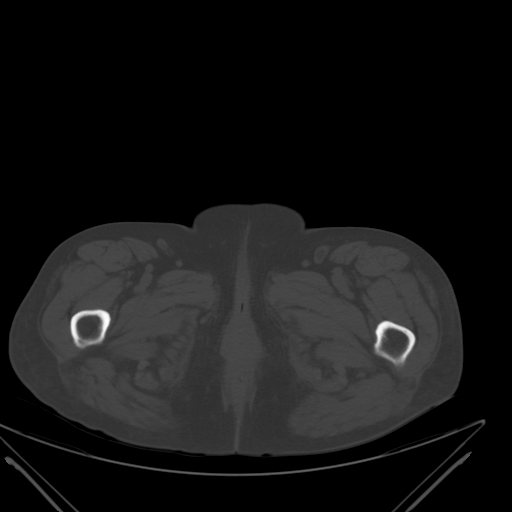
[im 23/93  soft-tissue]
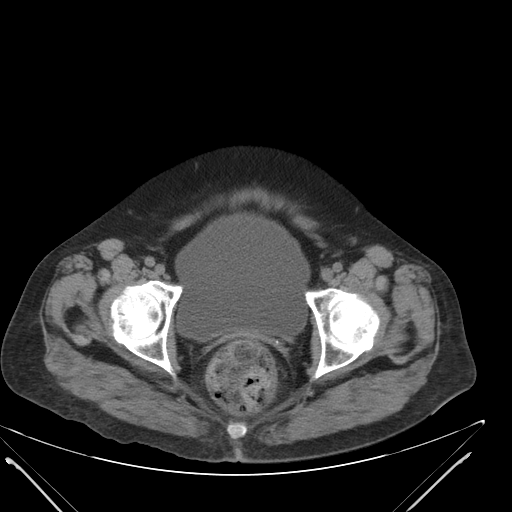
[im 34/93  soft-tissue]
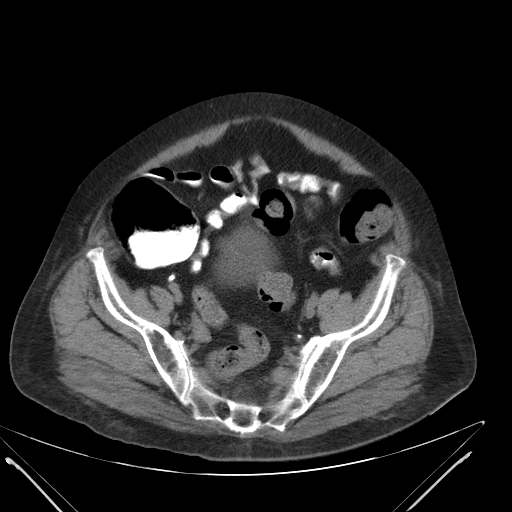
[im 48/93  soft-tissue]
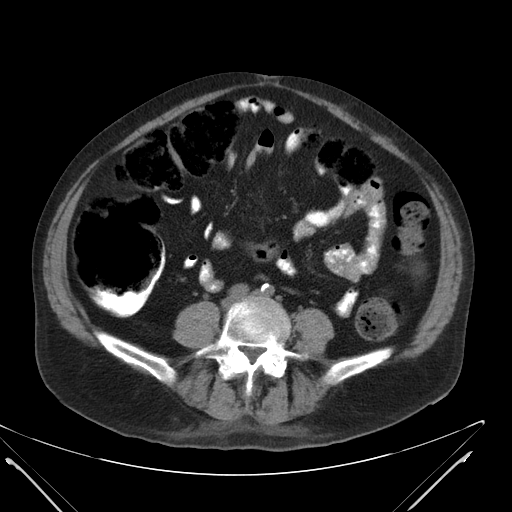
[im 59/93  soft-tissue]
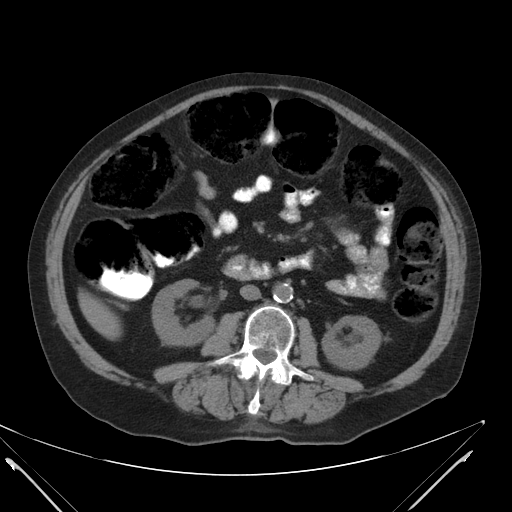
[im 70/93  soft-tissue]
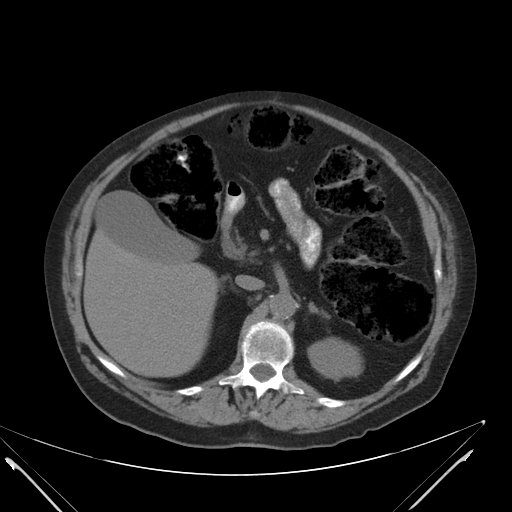
[im 85/93  soft-tissue]
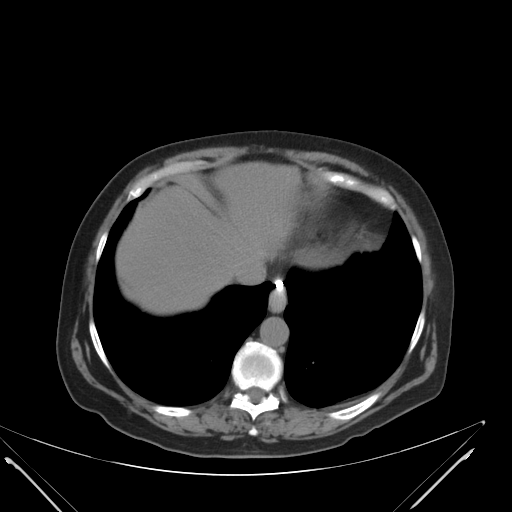

[Series 203: coronals, idose (2) · coronal · 0.45mm/px · 3 of 139 slices shown]
[im 47/139  soft-tissue]
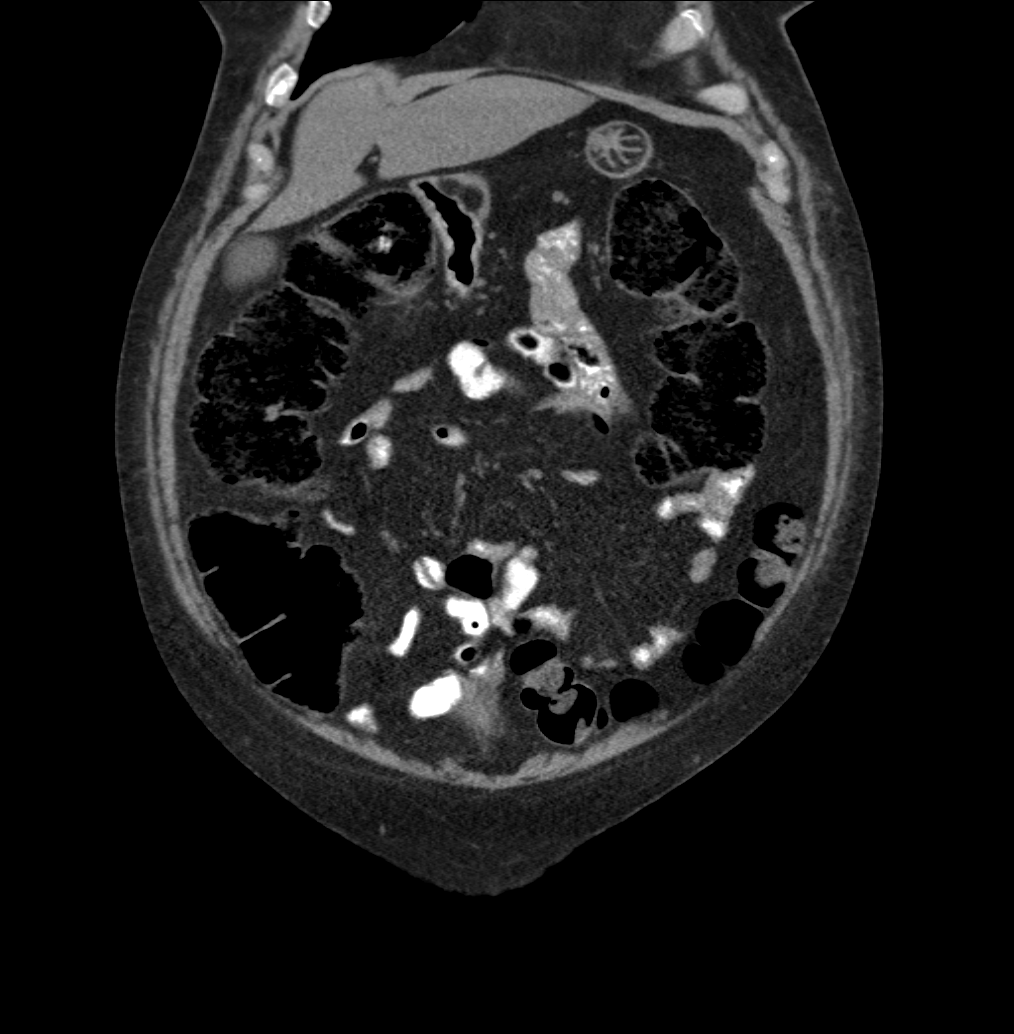
[im 62/139  soft-tissue]
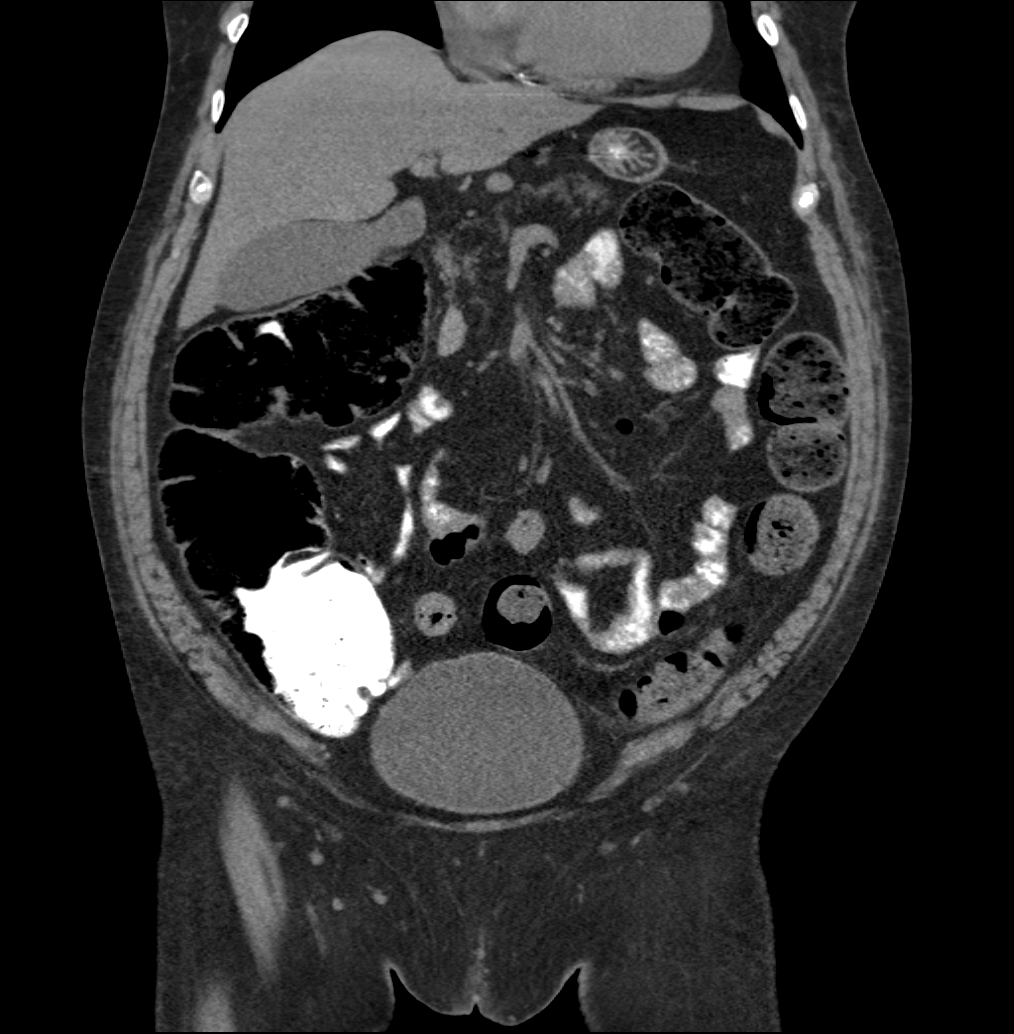
[im 77/139  soft-tissue]
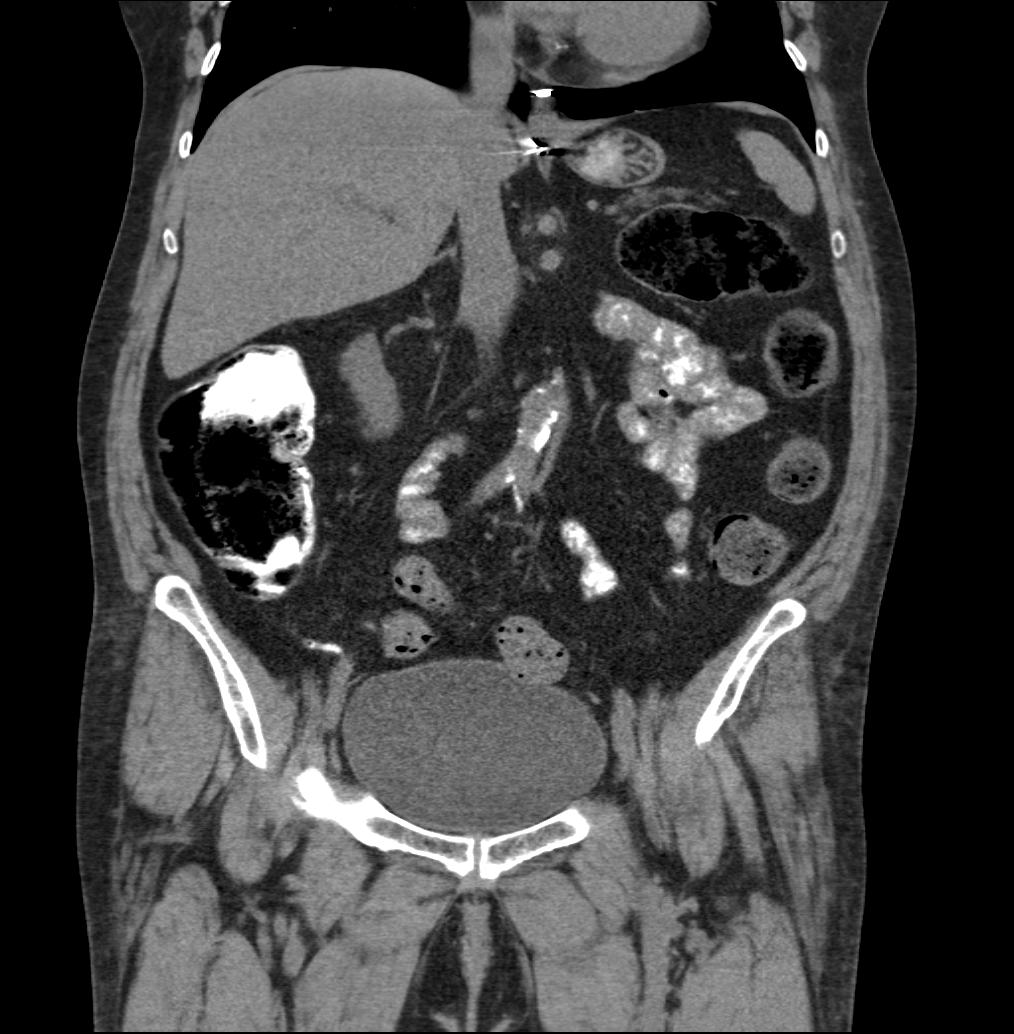

[10 of 46 positions shown; findings below may reference images not displayed]

FINDINGS: The lung bases are clear.  Postoperative changes at the EG junction.

Evaluation of solid organs and vascular structures is limited
without IV contrast material.

The unenhanced appearance of the liver, spleen, gallbladder, adrenal
glands, inferior vena cava, and retroperitoneal lymph nodes is
unremarkable. There is prominent diffuse fatty infiltration of the
pancreas. Small accessory spleen. Calcification of the abdominal
aorta without aneurysm. 8 mm stone in the lower pole right kidney.
No hydronephrosis or hydroureter. Sub cm hyperintense lesion in the
left kidney probably representing a hemorrhagic cyst.

Stomach, small bowel, and colon are not abnormally distended. Colon
is diffusely filled with stool and gas. Contrast material flows
through to the cecum without evidence of bowel obstruction. No bowel
wall thickening is appreciated. No free air or free fluid in the
abdomen. Abdominal wall musculature appears intact.

Pelvis: Appendix is normal. Bladder wall is not thickened.
Stool-filled rectosigmoid colon without evidence of diverticulitis.
No free or loculated pelvic fluid collections. No pelvic mass or
lymphadenopathy. Uterus appears surgically absent. Degenerative
changes in the lumbar spine. Mild lumbar scoliosis. Degenerative
changes in the hips.
IMPRESSION: No acute process demonstrated in the abdomen or pelvis. Diffusely
stool-filled colon. No small or large bowel obstruction or
inflammation suggested. Diffuse fatty infiltration of the pancreas.
Nonobstructing stone in the right kidney.

## 2017-04-03 DIAGNOSIS — Z9181 History of falling: Secondary | ICD-10-CM | POA: Diagnosis not present

## 2017-04-03 DIAGNOSIS — K5901 Slow transit constipation: Secondary | ICD-10-CM | POA: Diagnosis not present

## 2017-04-03 DIAGNOSIS — H8110 Benign paroxysmal vertigo, unspecified ear: Secondary | ICD-10-CM | POA: Diagnosis not present

## 2017-04-03 DIAGNOSIS — E034 Atrophy of thyroid (acquired): Secondary | ICD-10-CM | POA: Diagnosis not present

## 2017-04-03 DIAGNOSIS — I10 Essential (primary) hypertension: Secondary | ICD-10-CM | POA: Diagnosis not present

## 2017-04-03 DIAGNOSIS — R110 Nausea: Secondary | ICD-10-CM | POA: Diagnosis not present

## 2017-04-13 DIAGNOSIS — F0150 Vascular dementia without behavioral disturbance: Secondary | ICD-10-CM | POA: Diagnosis not present

## 2017-04-13 DIAGNOSIS — I69315 Cognitive social or emotional deficit following cerebral infarction: Secondary | ICD-10-CM | POA: Diagnosis not present

## 2017-04-13 DIAGNOSIS — N183 Chronic kidney disease, stage 3 (moderate): Secondary | ICD-10-CM | POA: Diagnosis not present

## 2017-04-13 DIAGNOSIS — I129 Hypertensive chronic kidney disease with stage 1 through stage 4 chronic kidney disease, or unspecified chronic kidney disease: Secondary | ICD-10-CM | POA: Diagnosis not present

## 2017-04-13 DIAGNOSIS — S80212D Abrasion, left knee, subsequent encounter: Secondary | ICD-10-CM | POA: Diagnosis not present

## 2017-04-13 DIAGNOSIS — F329 Major depressive disorder, single episode, unspecified: Secondary | ICD-10-CM | POA: Diagnosis not present

## 2017-04-16 DIAGNOSIS — I69315 Cognitive social or emotional deficit following cerebral infarction: Secondary | ICD-10-CM | POA: Diagnosis not present

## 2017-04-16 DIAGNOSIS — S80212D Abrasion, left knee, subsequent encounter: Secondary | ICD-10-CM | POA: Diagnosis not present

## 2017-04-16 DIAGNOSIS — I129 Hypertensive chronic kidney disease with stage 1 through stage 4 chronic kidney disease, or unspecified chronic kidney disease: Secondary | ICD-10-CM | POA: Diagnosis not present

## 2017-04-16 DIAGNOSIS — F329 Major depressive disorder, single episode, unspecified: Secondary | ICD-10-CM | POA: Diagnosis not present

## 2017-04-16 DIAGNOSIS — N183 Chronic kidney disease, stage 3 (moderate): Secondary | ICD-10-CM | POA: Diagnosis not present

## 2017-04-16 DIAGNOSIS — F0150 Vascular dementia without behavioral disturbance: Secondary | ICD-10-CM | POA: Diagnosis not present

## 2017-04-25 DIAGNOSIS — N183 Chronic kidney disease, stage 3 (moderate): Secondary | ICD-10-CM | POA: Diagnosis not present

## 2017-04-25 DIAGNOSIS — F329 Major depressive disorder, single episode, unspecified: Secondary | ICD-10-CM | POA: Diagnosis not present

## 2017-04-25 DIAGNOSIS — S80212D Abrasion, left knee, subsequent encounter: Secondary | ICD-10-CM | POA: Diagnosis not present

## 2017-04-25 DIAGNOSIS — I69315 Cognitive social or emotional deficit following cerebral infarction: Secondary | ICD-10-CM | POA: Diagnosis not present

## 2017-04-25 DIAGNOSIS — F0150 Vascular dementia without behavioral disturbance: Secondary | ICD-10-CM | POA: Diagnosis not present

## 2017-04-25 DIAGNOSIS — I129 Hypertensive chronic kidney disease with stage 1 through stage 4 chronic kidney disease, or unspecified chronic kidney disease: Secondary | ICD-10-CM | POA: Diagnosis not present

## 2017-04-28 DIAGNOSIS — I129 Hypertensive chronic kidney disease with stage 1 through stage 4 chronic kidney disease, or unspecified chronic kidney disease: Secondary | ICD-10-CM | POA: Diagnosis not present

## 2017-04-28 DIAGNOSIS — N183 Chronic kidney disease, stage 3 (moderate): Secondary | ICD-10-CM | POA: Diagnosis not present

## 2017-04-28 DIAGNOSIS — F329 Major depressive disorder, single episode, unspecified: Secondary | ICD-10-CM | POA: Diagnosis not present

## 2017-04-28 DIAGNOSIS — S80212D Abrasion, left knee, subsequent encounter: Secondary | ICD-10-CM | POA: Diagnosis not present

## 2017-04-28 DIAGNOSIS — F0150 Vascular dementia without behavioral disturbance: Secondary | ICD-10-CM | POA: Diagnosis not present

## 2017-04-28 DIAGNOSIS — I69315 Cognitive social or emotional deficit following cerebral infarction: Secondary | ICD-10-CM | POA: Diagnosis not present

## 2017-05-01 DIAGNOSIS — N183 Chronic kidney disease, stage 3 (moderate): Secondary | ICD-10-CM | POA: Diagnosis not present

## 2017-05-01 DIAGNOSIS — I129 Hypertensive chronic kidney disease with stage 1 through stage 4 chronic kidney disease, or unspecified chronic kidney disease: Secondary | ICD-10-CM | POA: Diagnosis not present

## 2017-05-01 DIAGNOSIS — F0150 Vascular dementia without behavioral disturbance: Secondary | ICD-10-CM | POA: Diagnosis not present

## 2017-05-01 DIAGNOSIS — I69315 Cognitive social or emotional deficit following cerebral infarction: Secondary | ICD-10-CM | POA: Diagnosis not present

## 2017-05-01 DIAGNOSIS — S80212D Abrasion, left knee, subsequent encounter: Secondary | ICD-10-CM | POA: Diagnosis not present

## 2017-05-01 DIAGNOSIS — F329 Major depressive disorder, single episode, unspecified: Secondary | ICD-10-CM | POA: Diagnosis not present

## 2017-05-03 DIAGNOSIS — I129 Hypertensive chronic kidney disease with stage 1 through stage 4 chronic kidney disease, or unspecified chronic kidney disease: Secondary | ICD-10-CM | POA: Diagnosis not present

## 2017-05-03 DIAGNOSIS — F0150 Vascular dementia without behavioral disturbance: Secondary | ICD-10-CM | POA: Diagnosis not present

## 2017-05-03 DIAGNOSIS — S80212D Abrasion, left knee, subsequent encounter: Secondary | ICD-10-CM | POA: Diagnosis not present

## 2017-05-03 DIAGNOSIS — F329 Major depressive disorder, single episode, unspecified: Secondary | ICD-10-CM | POA: Diagnosis not present

## 2017-05-03 DIAGNOSIS — I69315 Cognitive social or emotional deficit following cerebral infarction: Secondary | ICD-10-CM | POA: Diagnosis not present

## 2017-05-03 DIAGNOSIS — N183 Chronic kidney disease, stage 3 (moderate): Secondary | ICD-10-CM | POA: Diagnosis not present

## 2017-05-07 DIAGNOSIS — I129 Hypertensive chronic kidney disease with stage 1 through stage 4 chronic kidney disease, or unspecified chronic kidney disease: Secondary | ICD-10-CM | POA: Diagnosis not present

## 2017-05-07 DIAGNOSIS — I69315 Cognitive social or emotional deficit following cerebral infarction: Secondary | ICD-10-CM | POA: Diagnosis not present

## 2017-05-07 DIAGNOSIS — F329 Major depressive disorder, single episode, unspecified: Secondary | ICD-10-CM | POA: Diagnosis not present

## 2017-05-07 DIAGNOSIS — S80212D Abrasion, left knee, subsequent encounter: Secondary | ICD-10-CM | POA: Diagnosis not present

## 2017-05-07 DIAGNOSIS — F0150 Vascular dementia without behavioral disturbance: Secondary | ICD-10-CM | POA: Diagnosis not present

## 2017-05-07 DIAGNOSIS — N183 Chronic kidney disease, stage 3 (moderate): Secondary | ICD-10-CM | POA: Diagnosis not present

## 2017-05-18 ENCOUNTER — Telehealth: Payer: Self-pay | Admitting: Nurse Practitioner

## 2017-05-18 DIAGNOSIS — S80212D Abrasion, left knee, subsequent encounter: Secondary | ICD-10-CM | POA: Diagnosis not present

## 2017-05-18 DIAGNOSIS — I129 Hypertensive chronic kidney disease with stage 1 through stage 4 chronic kidney disease, or unspecified chronic kidney disease: Secondary | ICD-10-CM | POA: Diagnosis not present

## 2017-05-18 DIAGNOSIS — N183 Chronic kidney disease, stage 3 (moderate): Secondary | ICD-10-CM | POA: Diagnosis not present

## 2017-05-18 DIAGNOSIS — F0150 Vascular dementia without behavioral disturbance: Secondary | ICD-10-CM | POA: Diagnosis not present

## 2017-05-18 DIAGNOSIS — I69315 Cognitive social or emotional deficit following cerebral infarction: Secondary | ICD-10-CM | POA: Diagnosis not present

## 2017-05-18 DIAGNOSIS — F329 Major depressive disorder, single episode, unspecified: Secondary | ICD-10-CM | POA: Diagnosis not present

## 2017-05-18 NOTE — Telephone Encounter (Signed)
Niece will not be able to accompany the patient to her appointment. No recent labs. Nausea is unchanged. States the patient is always constipated. A friend of the patient will accompany her to the appointment.

## 2017-05-21 ENCOUNTER — Ambulatory Visit: Payer: Private Health Insurance - Indemnity | Admitting: Nurse Practitioner

## 2017-05-23 DIAGNOSIS — I129 Hypertensive chronic kidney disease with stage 1 through stage 4 chronic kidney disease, or unspecified chronic kidney disease: Secondary | ICD-10-CM | POA: Diagnosis not present

## 2017-05-23 DIAGNOSIS — F329 Major depressive disorder, single episode, unspecified: Secondary | ICD-10-CM | POA: Diagnosis not present

## 2017-05-23 DIAGNOSIS — I69315 Cognitive social or emotional deficit following cerebral infarction: Secondary | ICD-10-CM | POA: Diagnosis not present

## 2017-05-23 DIAGNOSIS — F0150 Vascular dementia without behavioral disturbance: Secondary | ICD-10-CM | POA: Diagnosis not present

## 2017-05-23 DIAGNOSIS — N183 Chronic kidney disease, stage 3 (moderate): Secondary | ICD-10-CM | POA: Diagnosis not present

## 2017-05-23 DIAGNOSIS — S80212D Abrasion, left knee, subsequent encounter: Secondary | ICD-10-CM | POA: Diagnosis not present

## 2017-05-25 ENCOUNTER — Ambulatory Visit (INDEPENDENT_AMBULATORY_CARE_PROVIDER_SITE_OTHER): Payer: Medicare Other | Admitting: Physician Assistant

## 2017-05-25 ENCOUNTER — Encounter: Payer: Self-pay | Admitting: Physician Assistant

## 2017-05-25 ENCOUNTER — Encounter: Payer: Self-pay | Admitting: Nurse Practitioner

## 2017-05-25 VITALS — BP 130/54 | HR 60 | Ht 61.0 in | Wt 140.0 lb

## 2017-05-25 DIAGNOSIS — K5909 Other constipation: Secondary | ICD-10-CM | POA: Diagnosis not present

## 2017-05-25 MED ORDER — LINACLOTIDE 290 MCG PO CAPS: 290.0000 ug | ORAL_CAPSULE | Freq: Every day | ORAL | 3 refills | Status: DC

## 2017-05-25 NOTE — Progress Notes (Signed)
Chief Complaint: Chronic constipation  HPI:  Brittany Hughes is an 83 year old Caucasian female with a past medical history as listed below including chronic constipation, who presents to clinic today with continued constipation.   Patient has followed with Dr. Ardis Hughs in the past but has not been seen by him since 2016. Her last visit in clinic was regarding normocytic anemia on 02/20/17 with Tye Savoy, NP.   Currently the patient tells me that she has had constipation "ever since I was a little girl". She tells me that she may not have a bowel movement for one to 2 weeks at a time. Currently she is having more trouble than ever. There is some uncertainty about whether or not she is using her lactulose solution. She tells me she is not taking a liquid and her niece who deals with her medications previously talked to our nurse and told her that she was not on lactulose. Patient has not had a bowel movement since Tuesday but has continued to pass gas. Her abdomen has become somewhat hard and she has discomfort mostly on the left side. Patient tells me she is "never regular". She has had to be manually disimpacted in the past. Currently she takes a suppository every week or so and has to "manually reach up there and get it out". Patient did have 1 day of normal stool about a month ago per her report.   Patient denies fever, chills, blood in her stool, melena, weight loss, anorexia, nausea, vomiting or symptoms that awaken her at night.    Past Medical History:  Diagnosis Date  . Atrophy of vulva   . Carotid stenosis   . Chronic kidney disease   . Depression   . Dermatophytosis of nail   . DVT (deep venous thrombosis) ([place]) 01/06/2014   RLE  . Dysuria   . GERD (gastroesophageal reflux disease)   . Hyperglycemia 04/18/2016  . Hyperlipidemia   . Hyperpotassemia   . Hypertension   . Macular degeneration    "left eye; growth behind that" (01/06/2014)  . Mild cognitive impairment, so stated   .  Nephrolithiasis   . Osteoporosis, unspecified   . Other B-complex deficiencies   . Other diseases of larynx   . Other voice and resonance disorders   . Reflux esophagitis   . Seizures ([place])    "one, years ago" (01/06/2014)  . Unspecified hereditary and idiopathic peripheral neuropathy   . Unspecified malignant neoplasm of skin, site unspecified    left foot  . Unspecified urinary incontinence   . Vitamin deficiency     Past Surgical History:  Procedure Laterality Date  . CAROTID ENDARTERECTOMY Right 2006  . CATARACT EXTRACTION W/ INTRAOCULAR LENS  IMPLANT, BILATERAL Bilateral 1990's  . DILATION AND CURETTAGE OF UTERUS    . EXCISIONAL HEMORRHOIDECTOMY  1970's?  Marland Kitchen STOMACH SURGERY  ~ 1960   "had ulcers for years; left scar tissue; did OR" (01/06/2014)  . VAGINAL HYSTERECTOMY  ~ 1980    Current Outpatient Prescriptions  Medication Sig Dispense Refill  . antiseptic oral rinse (BIOTENE) LIQD 1 application by Mouth Rinse route every 4 (four) hours as needed for dry mouth.    . cholecalciferol (VITAMIN D) 1000 units tablet Take 1,000 Units by mouth daily.    Marland Kitchen gabapentin (NEURONTIN) 300 MG capsule Take 300 mg by mouth at bedtime.    Marland Kitchen HYDROcodone-acetaminophen (NORCO/VICODIN) 5-325 MG tablet Take 1 tablet by mouth every 6 (six) hours as needed for moderate pain.    Marland Kitchen  Lactulose 20 GM/30ML SOLN Take 30 mLs (20 g total) by mouth 2 (two) times daily. As needed for constipation 120 mL 0  . levothyroxine (SYNTHROID, LEVOTHROID) 25 MCG tablet TAKE 1 TABLET (25 MCG TOTAL) BY MOUTH DAILY BEFORE BREAKFAST. 90 tablet 1  . lisinopril (PRINIVIL,ZESTRIL) 10 MG tablet Take 5 mg by mouth daily.    . Melatonin 3 MG TABS Take 1 tablet (3 mg total) by mouth daily. 30 tablet 0  . metoprolol (LOPRESSOR) 50 MG tablet Take 50 mg by mouth 2 (two) times daily.    Marland Kitchen omeprazole (PRILOSEC) 20 MG capsule TAKE 1 CAPSULE (20 MG TOTAL) BY MOUTH DAILY. FOR ACID REFLUX 90 capsule 1  . ondansetron (ZOFRAN-ODT) 4 MG  disintegrating tablet Take 4 mg by mouth every 8 (eight) hours as needed for nausea or vomiting.    Marland Kitchen oxybutynin (DITROPAN-XL) 10 MG 24 hr tablet Take 10 mg by mouth at bedtime.    Vladimir Faster Glycol-Propyl Glycol (SYSTANE) 0.4-0.3 % SOLN Apply 1 drop to eye daily as needed (for dry eyes).    . traZODone (DESYREL) 100 MG tablet Take 50 mg by mouth at bedtime.    . vitamin B-12 (CYANOCOBALAMIN) 1000 MCG tablet Take 1,000 mcg by mouth daily.    Marland Kitchen linaclotide (LINZESS) 290 MCG CAPS capsule Take 1 capsule (290 mcg total) by mouth daily before breakfast. 30 capsule 3   No current facility-administered medications for this visit.     Allergies as of 05/25/2017 - Review Complete 05/25/2017  Allergen Reaction Noted  . Codeine Other (See Comments) 03/10/2014  . Colace [docusate calcium] Nausea Only 01/12/2016    Family History  Problem Relation Age of Onset  . Heart disease Mother 29  . Kidney disease Father   . Diabetes Sister   . Heart disease Brother 41  . Heart attack Son 60  . Cancer Son        Throat  . Stroke Brother   . Cancer Brother   . Cancer Sister        Kidney    Social History   Social History  . Marital status: Widowed    Spouse name: N/A  . Number of children: N/A  . Years of education: N/A   Occupational History  . Not on file.   Social History Main Topics  . Smoking status: Former Smoker    Packs/day: 0.50    Years: 60.00    Types: Cigarettes    Quit date: 10/31/2011  . Smokeless tobacco: Never Used  . Alcohol use No  . Drug use: No  . Sexual activity: No   Other Topics Concern  . Not on file   Social History Narrative  . No narrative on file    Review of Systems:    Constitutional: No weight loss, fever or chills Cardiovascular: No chest pain Respiratory: No SOB Gastrointestinal: See HPI and otherwise negative    Physical Exam:  Vital signs: BP (!) 130/54   Pulse 60   Ht 5\' 1"  (1.549 m)   Wt 140 lb (63.5 kg)   BMI 26.45 kg/m    Constitutional:   Pleasant elderly Caucasian female appears to be in NAD, Well developed, Well nourished, alert and cooperative Head:  Normocephalic and atraumatic. Eyes:   PEERL, EOMI. No icterus. Conjunctiva pink. Ears:  Normal auditory acuity. Neck:  Supple Throat: Oral cavity and pharynx without inflammation, swelling or lesion.  Respiratory: Respirations even and unlabored. Lungs clear to auscultation bilaterally.   No wheezes,  crackles, or rhonchi.  Cardiovascular: Normal S1, S2. No MRG. Regular rate and rhythm. No peripheral edema, cyanosis or pallor.  Gastrointestinal:  Soft, mild distension, nontender. No rebound or guarding. Normal bowel sounds. No appreciable masses or hepatomegaly. Rectal:  Not performed.  Msk:  Symmetrical without gross deformities. Without edema, no deformity or joint abnormality. Ambulates with walker Neurologic:  Alert and  oriented x4;  grossly normal neurologically.  Skin:   Dry and intact without significant lesions or rashes. Psychiatric:  Demonstrates good judgement and reason without abnormal affect or behaviors.  No recent labs  Assessment: 1. Chronic constipation: Patient has had trouble with constipation for years now, using "Ex-Lax", per her report currently with a stool softener, thoughshe is not the greatest historian, likely this is due to opioids and multiple other medications she is on which are causing this side effect including trazodone, oxybutynin, hydrocortisone and gabapentin, discussed this today with the patient  Plan: 1. Prescribed Linzess 290 mcg once daily. Did provide the patient with samples and provided a prescription for 30 days with 3 refills 2. Patient was directed that hopefully she can discontinue her stool softeners and Ex-Lax as well as lactulose if above is working 3. Patient to return to clinic in 4-6 weeks with me or Dr. Ardis Hughs, she is to call the clinic if she continues with severe constipation before that  time  Ellouise Newer, PA-C [facility] 05/25/2017, 12:04 PM  Cc: Alvester Chou, NP

## 2017-05-25 NOTE — Patient Instructions (Signed)
   At 6:00pm:  Step 1: Drink one 5.4 oz bottle of CLENPIQ Step 2: Drink five (5) 8 oz. glasses of clear liquid over the next 3 hours. You may continue to drink additional clear liquids until bedtime.  If results are not satisfactory repeat these steps with the next bottle  Step 1:Drink the second 5.4 oz bottle of CLENPIQ Step 2: Drink three (3) 8 oz glasses of clear liquids over the next 2 hours  We have given you samples of Linzess 290 mcg daily

## 2017-05-26 NOTE — Progress Notes (Signed)
I agree with the above note, plan 

## 2017-05-31 DIAGNOSIS — F0150 Vascular dementia without behavioral disturbance: Secondary | ICD-10-CM | POA: Diagnosis not present

## 2017-05-31 DIAGNOSIS — N183 Chronic kidney disease, stage 3 (moderate): Secondary | ICD-10-CM | POA: Diagnosis not present

## 2017-05-31 DIAGNOSIS — I69315 Cognitive social or emotional deficit following cerebral infarction: Secondary | ICD-10-CM | POA: Diagnosis not present

## 2017-05-31 DIAGNOSIS — F329 Major depressive disorder, single episode, unspecified: Secondary | ICD-10-CM | POA: Diagnosis not present

## 2017-05-31 DIAGNOSIS — I129 Hypertensive chronic kidney disease with stage 1 through stage 4 chronic kidney disease, or unspecified chronic kidney disease: Secondary | ICD-10-CM | POA: Diagnosis not present

## 2017-05-31 DIAGNOSIS — S80212D Abrasion, left knee, subsequent encounter: Secondary | ICD-10-CM | POA: Diagnosis not present

## 2017-06-05 DIAGNOSIS — I129 Hypertensive chronic kidney disease with stage 1 through stage 4 chronic kidney disease, or unspecified chronic kidney disease: Secondary | ICD-10-CM | POA: Diagnosis not present

## 2017-06-05 DIAGNOSIS — N183 Chronic kidney disease, stage 3 (moderate): Secondary | ICD-10-CM | POA: Diagnosis not present

## 2017-06-05 DIAGNOSIS — S80212D Abrasion, left knee, subsequent encounter: Secondary | ICD-10-CM | POA: Diagnosis not present

## 2017-06-05 DIAGNOSIS — F0150 Vascular dementia without behavioral disturbance: Secondary | ICD-10-CM | POA: Diagnosis not present

## 2017-06-05 DIAGNOSIS — I69315 Cognitive social or emotional deficit following cerebral infarction: Secondary | ICD-10-CM | POA: Diagnosis not present

## 2017-06-05 DIAGNOSIS — F329 Major depressive disorder, single episode, unspecified: Secondary | ICD-10-CM | POA: Diagnosis not present

## 2017-06-08 DIAGNOSIS — F0150 Vascular dementia without behavioral disturbance: Secondary | ICD-10-CM | POA: Diagnosis not present

## 2017-06-08 DIAGNOSIS — N183 Chronic kidney disease, stage 3 (moderate): Secondary | ICD-10-CM | POA: Diagnosis not present

## 2017-06-08 DIAGNOSIS — I129 Hypertensive chronic kidney disease with stage 1 through stage 4 chronic kidney disease, or unspecified chronic kidney disease: Secondary | ICD-10-CM | POA: Diagnosis not present

## 2017-06-08 DIAGNOSIS — I69315 Cognitive social or emotional deficit following cerebral infarction: Secondary | ICD-10-CM | POA: Diagnosis not present

## 2017-06-08 DIAGNOSIS — F329 Major depressive disorder, single episode, unspecified: Secondary | ICD-10-CM | POA: Diagnosis not present

## 2017-06-08 DIAGNOSIS — S80212D Abrasion, left knee, subsequent encounter: Secondary | ICD-10-CM | POA: Diagnosis not present

## 2017-06-12 DIAGNOSIS — I69315 Cognitive social or emotional deficit following cerebral infarction: Secondary | ICD-10-CM | POA: Diagnosis not present

## 2017-06-12 DIAGNOSIS — F0150 Vascular dementia without behavioral disturbance: Secondary | ICD-10-CM | POA: Diagnosis not present

## 2017-06-12 DIAGNOSIS — F329 Major depressive disorder, single episode, unspecified: Secondary | ICD-10-CM | POA: Diagnosis not present

## 2017-06-12 DIAGNOSIS — Z743 Need for continuous supervision: Secondary | ICD-10-CM | POA: Diagnosis not present

## 2017-06-12 DIAGNOSIS — I129 Hypertensive chronic kidney disease with stage 1 through stage 4 chronic kidney disease, or unspecified chronic kidney disease: Secondary | ICD-10-CM | POA: Diagnosis not present

## 2017-06-12 DIAGNOSIS — N183 Chronic kidney disease, stage 3 (moderate): Secondary | ICD-10-CM | POA: Diagnosis not present

## 2017-06-14 DIAGNOSIS — Z743 Need for continuous supervision: Secondary | ICD-10-CM | POA: Diagnosis not present

## 2017-06-14 DIAGNOSIS — F329 Major depressive disorder, single episode, unspecified: Secondary | ICD-10-CM | POA: Diagnosis not present

## 2017-06-14 DIAGNOSIS — I129 Hypertensive chronic kidney disease with stage 1 through stage 4 chronic kidney disease, or unspecified chronic kidney disease: Secondary | ICD-10-CM | POA: Diagnosis not present

## 2017-06-14 DIAGNOSIS — F0150 Vascular dementia without behavioral disturbance: Secondary | ICD-10-CM | POA: Diagnosis not present

## 2017-06-14 DIAGNOSIS — I69315 Cognitive social or emotional deficit following cerebral infarction: Secondary | ICD-10-CM | POA: Diagnosis not present

## 2017-06-14 DIAGNOSIS — N183 Chronic kidney disease, stage 3 (moderate): Secondary | ICD-10-CM | POA: Diagnosis not present

## 2017-06-16 ENCOUNTER — Other Ambulatory Visit: Payer: Self-pay | Admitting: Internal Medicine

## 2017-06-18 DIAGNOSIS — F0150 Vascular dementia without behavioral disturbance: Secondary | ICD-10-CM | POA: Diagnosis not present

## 2017-06-18 DIAGNOSIS — F329 Major depressive disorder, single episode, unspecified: Secondary | ICD-10-CM | POA: Diagnosis not present

## 2017-06-18 DIAGNOSIS — Z743 Need for continuous supervision: Secondary | ICD-10-CM | POA: Diagnosis not present

## 2017-06-18 DIAGNOSIS — I69315 Cognitive social or emotional deficit following cerebral infarction: Secondary | ICD-10-CM | POA: Diagnosis not present

## 2017-06-18 DIAGNOSIS — I129 Hypertensive chronic kidney disease with stage 1 through stage 4 chronic kidney disease, or unspecified chronic kidney disease: Secondary | ICD-10-CM | POA: Diagnosis not present

## 2017-06-18 DIAGNOSIS — N183 Chronic kidney disease, stage 3 (moderate): Secondary | ICD-10-CM | POA: Diagnosis not present

## 2017-06-19 DIAGNOSIS — E034 Atrophy of thyroid (acquired): Secondary | ICD-10-CM | POA: Diagnosis not present

## 2017-06-19 DIAGNOSIS — R110 Nausea: Secondary | ICD-10-CM | POA: Diagnosis not present

## 2017-06-19 DIAGNOSIS — K5901 Slow transit constipation: Secondary | ICD-10-CM | POA: Diagnosis not present

## 2017-06-19 DIAGNOSIS — I10 Essential (primary) hypertension: Secondary | ICD-10-CM | POA: Diagnosis not present

## 2017-06-19 DIAGNOSIS — Z9181 History of falling: Secondary | ICD-10-CM | POA: Diagnosis not present

## 2017-06-19 DIAGNOSIS — R52 Pain, unspecified: Secondary | ICD-10-CM | POA: Diagnosis not present

## 2017-06-19 DIAGNOSIS — G4700 Insomnia, unspecified: Secondary | ICD-10-CM | POA: Diagnosis not present

## 2017-06-20 DIAGNOSIS — F329 Major depressive disorder, single episode, unspecified: Secondary | ICD-10-CM | POA: Diagnosis not present

## 2017-06-20 DIAGNOSIS — I69315 Cognitive social or emotional deficit following cerebral infarction: Secondary | ICD-10-CM | POA: Diagnosis not present

## 2017-06-20 DIAGNOSIS — I129 Hypertensive chronic kidney disease with stage 1 through stage 4 chronic kidney disease, or unspecified chronic kidney disease: Secondary | ICD-10-CM | POA: Diagnosis not present

## 2017-06-20 DIAGNOSIS — F0150 Vascular dementia without behavioral disturbance: Secondary | ICD-10-CM | POA: Diagnosis not present

## 2017-06-20 DIAGNOSIS — N183 Chronic kidney disease, stage 3 (moderate): Secondary | ICD-10-CM | POA: Diagnosis not present

## 2017-06-20 DIAGNOSIS — Z743 Need for continuous supervision: Secondary | ICD-10-CM | POA: Diagnosis not present

## 2017-06-27 DIAGNOSIS — I69315 Cognitive social or emotional deficit following cerebral infarction: Secondary | ICD-10-CM | POA: Diagnosis not present

## 2017-06-27 DIAGNOSIS — I129 Hypertensive chronic kidney disease with stage 1 through stage 4 chronic kidney disease, or unspecified chronic kidney disease: Secondary | ICD-10-CM | POA: Diagnosis not present

## 2017-06-27 DIAGNOSIS — N183 Chronic kidney disease, stage 3 (moderate): Secondary | ICD-10-CM | POA: Diagnosis not present

## 2017-06-27 DIAGNOSIS — Z743 Need for continuous supervision: Secondary | ICD-10-CM | POA: Diagnosis not present

## 2017-06-27 DIAGNOSIS — F0150 Vascular dementia without behavioral disturbance: Secondary | ICD-10-CM | POA: Diagnosis not present

## 2017-06-27 DIAGNOSIS — F329 Major depressive disorder, single episode, unspecified: Secondary | ICD-10-CM | POA: Diagnosis not present

## 2017-07-03 DIAGNOSIS — Z743 Need for continuous supervision: Secondary | ICD-10-CM | POA: Diagnosis not present

## 2017-07-03 DIAGNOSIS — F0150 Vascular dementia without behavioral disturbance: Secondary | ICD-10-CM | POA: Diagnosis not present

## 2017-07-03 DIAGNOSIS — F329 Major depressive disorder, single episode, unspecified: Secondary | ICD-10-CM | POA: Diagnosis not present

## 2017-07-03 DIAGNOSIS — I129 Hypertensive chronic kidney disease with stage 1 through stage 4 chronic kidney disease, or unspecified chronic kidney disease: Secondary | ICD-10-CM | POA: Diagnosis not present

## 2017-07-03 DIAGNOSIS — I69315 Cognitive social or emotional deficit following cerebral infarction: Secondary | ICD-10-CM | POA: Diagnosis not present

## 2017-07-03 DIAGNOSIS — N183 Chronic kidney disease, stage 3 (moderate): Secondary | ICD-10-CM | POA: Diagnosis not present

## 2017-07-10 DIAGNOSIS — I129 Hypertensive chronic kidney disease with stage 1 through stage 4 chronic kidney disease, or unspecified chronic kidney disease: Secondary | ICD-10-CM | POA: Diagnosis not present

## 2017-07-10 DIAGNOSIS — F329 Major depressive disorder, single episode, unspecified: Secondary | ICD-10-CM | POA: Diagnosis not present

## 2017-07-10 DIAGNOSIS — I69315 Cognitive social or emotional deficit following cerebral infarction: Secondary | ICD-10-CM | POA: Diagnosis not present

## 2017-07-10 DIAGNOSIS — Z743 Need for continuous supervision: Secondary | ICD-10-CM | POA: Diagnosis not present

## 2017-07-10 DIAGNOSIS — N183 Chronic kidney disease, stage 3 (moderate): Secondary | ICD-10-CM | POA: Diagnosis not present

## 2017-07-10 DIAGNOSIS — F0150 Vascular dementia without behavioral disturbance: Secondary | ICD-10-CM | POA: Diagnosis not present

## 2017-07-17 DIAGNOSIS — G4700 Insomnia, unspecified: Secondary | ICD-10-CM | POA: Diagnosis not present

## 2017-07-17 DIAGNOSIS — K5901 Slow transit constipation: Secondary | ICD-10-CM | POA: Diagnosis not present

## 2017-07-17 DIAGNOSIS — H8110 Benign paroxysmal vertigo, unspecified ear: Secondary | ICD-10-CM | POA: Diagnosis not present

## 2017-07-17 DIAGNOSIS — J209 Acute bronchitis, unspecified: Secondary | ICD-10-CM | POA: Diagnosis not present

## 2017-07-17 DIAGNOSIS — R52 Pain, unspecified: Secondary | ICD-10-CM | POA: Diagnosis not present

## 2017-07-17 DIAGNOSIS — R413 Other amnesia: Secondary | ICD-10-CM | POA: Diagnosis not present

## 2017-08-15 DIAGNOSIS — F419 Anxiety disorder, unspecified: Secondary | ICD-10-CM | POA: Diagnosis not present

## 2017-08-15 DIAGNOSIS — F410 Panic disorder [episodic paroxysmal anxiety] without agoraphobia: Secondary | ICD-10-CM | POA: Diagnosis not present

## 2017-08-15 DIAGNOSIS — K5901 Slow transit constipation: Secondary | ICD-10-CM | POA: Diagnosis not present

## 2017-08-15 DIAGNOSIS — Z23 Encounter for immunization: Secondary | ICD-10-CM | POA: Diagnosis not present

## 2017-08-15 DIAGNOSIS — I10 Essential (primary) hypertension: Secondary | ICD-10-CM | POA: Diagnosis not present

## 2017-08-15 DIAGNOSIS — R110 Nausea: Secondary | ICD-10-CM | POA: Diagnosis not present

## 2017-08-16 DIAGNOSIS — N183 Chronic kidney disease, stage 3 (moderate): Secondary | ICD-10-CM | POA: Diagnosis not present

## 2017-08-16 DIAGNOSIS — K5900 Constipation, unspecified: Secondary | ICD-10-CM | POA: Diagnosis not present

## 2017-08-16 DIAGNOSIS — I129 Hypertensive chronic kidney disease with stage 1 through stage 4 chronic kidney disease, or unspecified chronic kidney disease: Secondary | ICD-10-CM | POA: Diagnosis not present

## 2017-08-16 DIAGNOSIS — I69315 Cognitive social or emotional deficit following cerebral infarction: Secondary | ICD-10-CM | POA: Diagnosis not present

## 2017-08-16 DIAGNOSIS — F0150 Vascular dementia without behavioral disturbance: Secondary | ICD-10-CM | POA: Diagnosis not present

## 2017-08-16 DIAGNOSIS — F329 Major depressive disorder, single episode, unspecified: Secondary | ICD-10-CM | POA: Diagnosis not present

## 2017-08-21 DIAGNOSIS — N183 Chronic kidney disease, stage 3 (moderate): Secondary | ICD-10-CM | POA: Diagnosis not present

## 2017-08-21 DIAGNOSIS — F0150 Vascular dementia without behavioral disturbance: Secondary | ICD-10-CM | POA: Diagnosis not present

## 2017-08-21 DIAGNOSIS — I129 Hypertensive chronic kidney disease with stage 1 through stage 4 chronic kidney disease, or unspecified chronic kidney disease: Secondary | ICD-10-CM | POA: Diagnosis not present

## 2017-08-21 DIAGNOSIS — F329 Major depressive disorder, single episode, unspecified: Secondary | ICD-10-CM | POA: Diagnosis not present

## 2017-08-21 DIAGNOSIS — I69315 Cognitive social or emotional deficit following cerebral infarction: Secondary | ICD-10-CM | POA: Diagnosis not present

## 2017-08-21 DIAGNOSIS — K5900 Constipation, unspecified: Secondary | ICD-10-CM | POA: Diagnosis not present

## 2017-08-23 DIAGNOSIS — K5900 Constipation, unspecified: Secondary | ICD-10-CM | POA: Diagnosis not present

## 2017-08-23 DIAGNOSIS — I69315 Cognitive social or emotional deficit following cerebral infarction: Secondary | ICD-10-CM | POA: Diagnosis not present

## 2017-08-23 DIAGNOSIS — N183 Chronic kidney disease, stage 3 (moderate): Secondary | ICD-10-CM | POA: Diagnosis not present

## 2017-08-23 DIAGNOSIS — I129 Hypertensive chronic kidney disease with stage 1 through stage 4 chronic kidney disease, or unspecified chronic kidney disease: Secondary | ICD-10-CM | POA: Diagnosis not present

## 2017-08-23 DIAGNOSIS — F0150 Vascular dementia without behavioral disturbance: Secondary | ICD-10-CM | POA: Diagnosis not present

## 2017-08-23 DIAGNOSIS — F329 Major depressive disorder, single episode, unspecified: Secondary | ICD-10-CM | POA: Diagnosis not present

## 2017-08-28 DIAGNOSIS — F0150 Vascular dementia without behavioral disturbance: Secondary | ICD-10-CM | POA: Diagnosis not present

## 2017-08-28 DIAGNOSIS — I129 Hypertensive chronic kidney disease with stage 1 through stage 4 chronic kidney disease, or unspecified chronic kidney disease: Secondary | ICD-10-CM | POA: Diagnosis not present

## 2017-08-28 DIAGNOSIS — N183 Chronic kidney disease, stage 3 (moderate): Secondary | ICD-10-CM | POA: Diagnosis not present

## 2017-08-28 DIAGNOSIS — K5900 Constipation, unspecified: Secondary | ICD-10-CM | POA: Diagnosis not present

## 2017-08-28 DIAGNOSIS — F329 Major depressive disorder, single episode, unspecified: Secondary | ICD-10-CM | POA: Diagnosis not present

## 2017-08-28 DIAGNOSIS — I69315 Cognitive social or emotional deficit following cerebral infarction: Secondary | ICD-10-CM | POA: Diagnosis not present

## 2017-08-30 DIAGNOSIS — F0150 Vascular dementia without behavioral disturbance: Secondary | ICD-10-CM | POA: Diagnosis not present

## 2017-08-30 DIAGNOSIS — I129 Hypertensive chronic kidney disease with stage 1 through stage 4 chronic kidney disease, or unspecified chronic kidney disease: Secondary | ICD-10-CM | POA: Diagnosis not present

## 2017-08-30 DIAGNOSIS — I69315 Cognitive social or emotional deficit following cerebral infarction: Secondary | ICD-10-CM | POA: Diagnosis not present

## 2017-08-30 DIAGNOSIS — K5900 Constipation, unspecified: Secondary | ICD-10-CM | POA: Diagnosis not present

## 2017-08-30 DIAGNOSIS — F329 Major depressive disorder, single episode, unspecified: Secondary | ICD-10-CM | POA: Diagnosis not present

## 2017-08-30 DIAGNOSIS — N183 Chronic kidney disease, stage 3 (moderate): Secondary | ICD-10-CM | POA: Diagnosis not present

## 2017-09-04 DIAGNOSIS — K5900 Constipation, unspecified: Secondary | ICD-10-CM | POA: Diagnosis not present

## 2017-09-04 DIAGNOSIS — I129 Hypertensive chronic kidney disease with stage 1 through stage 4 chronic kidney disease, or unspecified chronic kidney disease: Secondary | ICD-10-CM | POA: Diagnosis not present

## 2017-09-04 DIAGNOSIS — I69315 Cognitive social or emotional deficit following cerebral infarction: Secondary | ICD-10-CM | POA: Diagnosis not present

## 2017-09-04 DIAGNOSIS — N183 Chronic kidney disease, stage 3 (moderate): Secondary | ICD-10-CM | POA: Diagnosis not present

## 2017-09-04 DIAGNOSIS — F329 Major depressive disorder, single episode, unspecified: Secondary | ICD-10-CM | POA: Diagnosis not present

## 2017-09-04 DIAGNOSIS — F0150 Vascular dementia without behavioral disturbance: Secondary | ICD-10-CM | POA: Diagnosis not present

## 2017-09-11 DIAGNOSIS — F329 Major depressive disorder, single episode, unspecified: Secondary | ICD-10-CM | POA: Diagnosis not present

## 2017-09-11 DIAGNOSIS — F0150 Vascular dementia without behavioral disturbance: Secondary | ICD-10-CM | POA: Diagnosis not present

## 2017-09-11 DIAGNOSIS — I129 Hypertensive chronic kidney disease with stage 1 through stage 4 chronic kidney disease, or unspecified chronic kidney disease: Secondary | ICD-10-CM | POA: Diagnosis not present

## 2017-09-11 DIAGNOSIS — K5900 Constipation, unspecified: Secondary | ICD-10-CM | POA: Diagnosis not present

## 2017-09-11 DIAGNOSIS — N183 Chronic kidney disease, stage 3 (moderate): Secondary | ICD-10-CM | POA: Diagnosis not present

## 2017-09-11 DIAGNOSIS — I69315 Cognitive social or emotional deficit following cerebral infarction: Secondary | ICD-10-CM | POA: Diagnosis not present

## 2017-09-18 DIAGNOSIS — R52 Pain, unspecified: Secondary | ICD-10-CM | POA: Diagnosis not present

## 2017-09-18 DIAGNOSIS — I10 Essential (primary) hypertension: Secondary | ICD-10-CM | POA: Diagnosis not present

## 2017-09-18 DIAGNOSIS — H8110 Benign paroxysmal vertigo, unspecified ear: Secondary | ICD-10-CM | POA: Diagnosis not present

## 2017-09-18 DIAGNOSIS — K5901 Slow transit constipation: Secondary | ICD-10-CM | POA: Diagnosis not present

## 2017-09-18 DIAGNOSIS — Z9181 History of falling: Secondary | ICD-10-CM | POA: Diagnosis not present

## 2017-09-18 DIAGNOSIS — F410 Panic disorder [episodic paroxysmal anxiety] without agoraphobia: Secondary | ICD-10-CM | POA: Diagnosis not present

## 2017-09-18 DIAGNOSIS — E034 Atrophy of thyroid (acquired): Secondary | ICD-10-CM | POA: Diagnosis not present

## 2017-09-18 DIAGNOSIS — G4700 Insomnia, unspecified: Secondary | ICD-10-CM | POA: Diagnosis not present

## 2017-09-20 DIAGNOSIS — I69315 Cognitive social or emotional deficit following cerebral infarction: Secondary | ICD-10-CM | POA: Diagnosis not present

## 2017-09-20 DIAGNOSIS — F329 Major depressive disorder, single episode, unspecified: Secondary | ICD-10-CM | POA: Diagnosis not present

## 2017-09-20 DIAGNOSIS — K5900 Constipation, unspecified: Secondary | ICD-10-CM | POA: Diagnosis not present

## 2017-09-20 DIAGNOSIS — F0150 Vascular dementia without behavioral disturbance: Secondary | ICD-10-CM | POA: Diagnosis not present

## 2017-09-20 DIAGNOSIS — I129 Hypertensive chronic kidney disease with stage 1 through stage 4 chronic kidney disease, or unspecified chronic kidney disease: Secondary | ICD-10-CM | POA: Diagnosis not present

## 2017-09-20 DIAGNOSIS — N183 Chronic kidney disease, stage 3 (moderate): Secondary | ICD-10-CM | POA: Diagnosis not present

## 2017-09-26 DIAGNOSIS — K5900 Constipation, unspecified: Secondary | ICD-10-CM | POA: Diagnosis not present

## 2017-09-26 DIAGNOSIS — F0150 Vascular dementia without behavioral disturbance: Secondary | ICD-10-CM | POA: Diagnosis not present

## 2017-09-26 DIAGNOSIS — N183 Chronic kidney disease, stage 3 (moderate): Secondary | ICD-10-CM | POA: Diagnosis not present

## 2017-09-26 DIAGNOSIS — F329 Major depressive disorder, single episode, unspecified: Secondary | ICD-10-CM | POA: Diagnosis not present

## 2017-09-26 DIAGNOSIS — I129 Hypertensive chronic kidney disease with stage 1 through stage 4 chronic kidney disease, or unspecified chronic kidney disease: Secondary | ICD-10-CM | POA: Diagnosis not present

## 2017-09-26 DIAGNOSIS — I69315 Cognitive social or emotional deficit following cerebral infarction: Secondary | ICD-10-CM | POA: Diagnosis not present

## 2017-10-02 DIAGNOSIS — I129 Hypertensive chronic kidney disease with stage 1 through stage 4 chronic kidney disease, or unspecified chronic kidney disease: Secondary | ICD-10-CM | POA: Diagnosis not present

## 2017-10-02 DIAGNOSIS — K5900 Constipation, unspecified: Secondary | ICD-10-CM | POA: Diagnosis not present

## 2017-10-02 DIAGNOSIS — I69315 Cognitive social or emotional deficit following cerebral infarction: Secondary | ICD-10-CM | POA: Diagnosis not present

## 2017-10-02 DIAGNOSIS — N183 Chronic kidney disease, stage 3 (moderate): Secondary | ICD-10-CM | POA: Diagnosis not present

## 2017-10-02 DIAGNOSIS — F0150 Vascular dementia without behavioral disturbance: Secondary | ICD-10-CM | POA: Diagnosis not present

## 2017-10-02 DIAGNOSIS — F329 Major depressive disorder, single episode, unspecified: Secondary | ICD-10-CM | POA: Diagnosis not present

## 2017-10-03 ENCOUNTER — Ambulatory Visit (INDEPENDENT_AMBULATORY_CARE_PROVIDER_SITE_OTHER): Payer: Medicare Other | Admitting: Primary Care

## 2017-10-03 ENCOUNTER — Encounter: Payer: Self-pay | Admitting: Primary Care

## 2017-10-03 VITALS — BP 120/74 | HR 62 | Temp 98.2°F | Ht 61.0 in | Wt 137.1 lb

## 2017-10-03 DIAGNOSIS — K581 Irritable bowel syndrome with constipation: Secondary | ICD-10-CM

## 2017-10-03 DIAGNOSIS — N183 Chronic kidney disease, stage 3 unspecified: Secondary | ICD-10-CM

## 2017-10-03 DIAGNOSIS — K219 Gastro-esophageal reflux disease without esophagitis: Secondary | ICD-10-CM | POA: Diagnosis not present

## 2017-10-03 DIAGNOSIS — G894 Chronic pain syndrome: Secondary | ICD-10-CM | POA: Diagnosis not present

## 2017-10-03 DIAGNOSIS — I10 Essential (primary) hypertension: Secondary | ICD-10-CM | POA: Diagnosis not present

## 2017-10-03 DIAGNOSIS — E785 Hyperlipidemia, unspecified: Secondary | ICD-10-CM | POA: Diagnosis not present

## 2017-10-03 DIAGNOSIS — R609 Edema, unspecified: Secondary | ICD-10-CM

## 2017-10-03 DIAGNOSIS — G629 Polyneuropathy, unspecified: Secondary | ICD-10-CM | POA: Diagnosis not present

## 2017-10-03 DIAGNOSIS — E039 Hypothyroidism, unspecified: Secondary | ICD-10-CM | POA: Diagnosis not present

## 2017-10-03 DIAGNOSIS — G4700 Insomnia, unspecified: Secondary | ICD-10-CM

## 2017-10-03 DIAGNOSIS — R32 Unspecified urinary incontinence: Secondary | ICD-10-CM | POA: Diagnosis not present

## 2017-10-03 DIAGNOSIS — R600 Localized edema: Secondary | ICD-10-CM

## 2017-10-03 DIAGNOSIS — K5909 Other constipation: Secondary | ICD-10-CM

## 2017-10-03 LAB — LIPID PANEL
CHOL/HDL RATIO: 5
Cholesterol: 169 mg/dL (ref 0–200)
HDL: 36.8 mg/dL — AB (ref >39.00)
NONHDL: 131.93
TRIGLYCERIDES: 228 mg/dL — AB (ref 0.0–149.0)
VLDL: 45.6 mg/dL — AB (ref 0.0–40.0)

## 2017-10-03 LAB — LDL CHOLESTEROL, DIRECT: LDL DIRECT: 94 mg/dL

## 2017-10-03 LAB — T4, FREE: Free T4: 0.9 ng/dL (ref 0.60–1.60)

## 2017-10-03 LAB — TSH: TSH: 2.52 u[IU]/mL (ref 0.35–4.50)

## 2017-10-03 MED ORDER — PROMETHAZINE HCL 12.5 MG PO TABS: 12.5000 mg | ORAL_TABLET | Freq: Two times a day (BID) | ORAL | 0 refills | Status: DC | PRN

## 2017-10-03 MED ORDER — SOLIFENACIN SUCCINATE 5 MG PO TABS: 5.0000 mg | ORAL_TABLET | Freq: Every day | ORAL | 0 refills | Status: DC

## 2017-10-03 MED ORDER — LISINOPRIL 5 MG PO TABS: 5.0000 mg | ORAL_TABLET | Freq: Every day | ORAL | 1 refills | Status: DC

## 2017-10-03 MED ORDER — LUBIPROSTONE 24 MCG PO CAPS: 24.0000 ug | ORAL_CAPSULE | Freq: Two times a day (BID) | ORAL | 1 refills | Status: DC

## 2017-10-03 MED ORDER — GABAPENTIN 300 MG PO CAPS: 300.0000 mg | ORAL_CAPSULE | Freq: Every day | ORAL | 1 refills | Status: DC

## 2017-10-03 MED ORDER — AMITRIPTYLINE HCL 25 MG PO TABS: 25.0000 mg | ORAL_TABLET | Freq: Every day | ORAL | 0 refills | Status: DC

## 2017-10-03 MED ORDER — METOPROLOL SUCCINATE ER 50 MG PO TB24: 50.0000 mg | ORAL_TABLET | Freq: Every day | ORAL | 1 refills | Status: DC

## 2017-10-03 NOTE — Assessment & Plan Note (Signed)
Constipation type. Continue Amitiza and stool softener daily.  Discussed to use MiraLAX as needed. We will switch from ondansetron to promethazine given this is more effective for symptoms.  Discussed potential drowsiness side effect and to exercise caution.

## 2017-10-03 NOTE — Assessment & Plan Note (Addendum)
Continue Amitiza and stool softener. Use MiraLAX as needed. Will remove tramadol and hydrocodone from her medication regimen.

## 2017-10-03 NOTE — Assessment & Plan Note (Signed)
Managed on oxybutynin without significant improvement in symptoms.  Given that she did well on Vesicare will switch.  She will update in a few weeks.

## 2017-10-03 NOTE — Assessment & Plan Note (Signed)
Previously managed on statin, this was removed by prior PCP.  Recheck lipids today.

## 2017-10-03 NOTE — Assessment & Plan Note (Signed)
Chronic per patient.  Noted to the left lower extremity today.  Trace pitting.

## 2017-10-03 NOTE — Assessment & Plan Note (Deleted)
Chronic per patient.  Noted to the left lower extremity today.  Trace pitting.

## 2017-10-03 NOTE — Assessment & Plan Note (Signed)
Following with [place] [facility].

## 2017-10-03 NOTE — Assessment & Plan Note (Signed)
Chronic to extremities. Continue gabapentin.

## 2017-10-03 NOTE — Assessment & Plan Note (Signed)
Stable on omeprazole, continue same.

## 2017-10-03 NOTE — Assessment & Plan Note (Signed)
Stable in the office today. Continue lisinopril and metoprolol succinate.  BMP pending.

## 2017-10-03 NOTE — Assessment & Plan Note (Signed)
Is currently managed on tramadol, previously managed on hydrocodone.  Tramadol causes dizziness and confusion per niece, will discontinue today. Switch to Tylenol/Aleve.  We will also reinitiate amitriptyline to help with insomnia and also pain.

## 2017-10-03 NOTE — Assessment & Plan Note (Signed)
No recent thyroid check, compliant to levothyroxine.  TSH and Free T4 pending.

## 2017-10-03 NOTE — Progress Notes (Signed)
Subjective:    Patient ID: Brittany Hughes, female    DOB: 03/19/1928, 83 y.o.   MRN: [id number]  HPI  Brittany Hughes is an 83 year old female who presents today to establish care and discuss the problems mentioned below. Will obtain old records.  She is with her niece today who manages her medical care including occasions.  They are both concerned as they believe the patient is overmedicated.  1) Hypothyroidism: Currently managed on levothyroxine 25 mcg. Her last TSH was 5.3 in June 2017.  She is compliant to her levothyroxine daily.  2) Essential Hypertension; Currently managed on lisinopril 10 mg, metoprolol succinate 50 mg. She denies chest pain.   BP Readings from Last 3 Encounters:  10/03/17 120/74  05/25/17 (!) 130/54  02/20/17 118/70     3) Urinary Incontinence: Currently managed on oxybutynin XL 10 mg. She reports that this isn't working too well. She once tried [employer] which seemed to work better. She would like to switch back to [employer].   4) Chronic Constipation/Hemorrhoids: Currently managed on Amitiza 24 mg twice daily which is more effective constipation. Previously managed on Linzess but wasn't as effective. She's also taking Magnesium Oxide, Stool Softener, Miralax. Previoulsy following with GI, last visit in July 2018.  No current pending appointment  Previously managed on promethazine for GI upset/IBS/ulcers which she took 2-3 times weekly on average. She took this for years which was very effective. Currently managed on ondansetron which has no effect, this was switched by her prior PCP.   5) Peripheral Neuropathy/Chronic Pain: Currently managed on gabapentin 300 mg at bedtime, does very well on this medication. Chronic pain to generalized body. Currently taking Tramadol 50 mg several times daily which makes her drowsy.  Previously on as needed hydrocodone, no recent use.  6) Insomnia: Currently managed on Trazodone 200 mg, Melatonin 3 mg but this is not effective. She has  difficulty staying asleep as she will wake 3-4 times during the night. Previously managed on amitriptyline 25 mg for 40 years and did very well on this medication. This was discontinued one year ago by her PCP and has since had difficulty with good sleep.   Review of Systems  Constitutional: Negative for fatigue.  Eyes: Negative for visual disturbance.  Respiratory: Negative for shortness of breath.   Cardiovascular: Negative for chest pain.  Gastrointestinal:       Chronic constipation  Genitourinary:       Urinary incontinence  Musculoskeletal: Positive for arthralgias.  Skin: Negative for color change.  Neurological: Negative for dizziness and headaches.  Psychiatric/Behavioral: Positive for sleep disturbance.       Past Medical History:  Diagnosis Date  . Atrophy of vulva   . Carotid stenosis   . Chronic kidney disease   . Depression   . Dermatophytosis of nail   . DVT (deep venous thrombosis) ([place]) 01/06/2014   RLE  . Dysuria   . GERD (gastroesophageal reflux disease)   . Hyperglycemia 04/18/2016  . Hyperlipidemia   . Hyperpotassemia   . Hypertension   . Macular degeneration    "left eye; growth behind that" (01/06/2014)  . Mild cognitive impairment, so stated   . Nephrolithiasis   . Osteoporosis, unspecified   . Other B-complex deficiencies   . Other diseases of larynx   . Other voice and resonance disorders   . Reflux esophagitis   . Seizures ([place])    "one, years ago" (01/06/2014)  . Unspecified hereditary and idiopathic peripheral neuropathy   .  Unspecified malignant neoplasm of skin, site unspecified    left foot  . Unspecified urinary incontinence   . Vitamin deficiency      Social History   Socioeconomic History  . Marital status: Widowed    Spouse name: Not on file  . Number of children: Not on file  . Years of education: Not on file  . Highest education level: Not on file  Social Needs  . Financial resource strain: Not on file  . Food insecurity -  worry: Not on file  . Food insecurity - inability: Not on file  . Transportation needs - medical: Not on file  . Transportation needs - non-medical: Not on file  Occupational History  . Not on file  Tobacco Use  . Smoking status: Former Smoker    Packs/day: 0.50    Years: 60.00    Pack years: 30.00    Types: Cigarettes    Last attempt to quit: 10/31/2011    Years since quitting: 5.9  . Smokeless tobacco: Never Used  Substance and Sexual Activity  . Alcohol use: No  . Drug use: No  . Sexual activity: No  Other Topics Concern  . Not on file  Social History Narrative  . Not on file    Past Surgical History:  Procedure Laterality Date  . CAROTID ENDARTERECTOMY Right 2006  . CATARACT EXTRACTION W/ INTRAOCULAR LENS  IMPLANT, BILATERAL Bilateral 1990's  . DILATION AND CURETTAGE OF UTERUS    . EXCISIONAL HEMORRHOIDECTOMY  1970's?  Marland Kitchen STOMACH SURGERY  ~ 1960   "had ulcers for years; left scar tissue; did OR" (01/06/2014)  . VAGINAL HYSTERECTOMY  ~ 1980    Family History  Problem Relation Age of Onset  . Heart disease Mother 33  . Kidney disease Father   . Diabetes Sister   . Heart disease Brother 73  . Heart attack Son 33  . Cancer Son        Throat  . Stroke Brother   . Cancer Brother   . Cancer Sister        Kidney    Allergies  Allergen Reactions  . Codeine Other (See Comments)    Possibly crazy actions  . Colace [Docusate Calcium] Nausea Only    Current Outpatient Medications on File Prior to Visit  Medication Sig Dispense Refill  . antiseptic oral rinse (BIOTENE) LIQD 1 application by Mouth Rinse route every 4 (four) hours as needed for dry mouth.    . cholecalciferol (VITAMIN D) 1000 units tablet Take 1,000 Units by mouth daily.    Marland Kitchen levothyroxine (SYNTHROID, LEVOTHROID) 25 MCG tablet TAKE 1 TABLET (25 MCG TOTAL) BY MOUTH DAILY BEFORE BREAKFAST. 90 tablet 1  . Melatonin 3 MG TABS Take 1 tablet (3 mg total) by mouth daily. 30 tablet 0  . omeprazole (PRILOSEC)  20 MG capsule TAKE 1 CAPSULE (20 MG TOTAL) BY MOUTH DAILY. FOR ACID REFLUX 90 capsule 1  . Polyethyl Glycol-Propyl Glycol (SYSTANE) 0.4-0.3 % SOLN Apply 1 drop to eye daily as needed (for dry eyes).    . vitamin B-12 (CYANOCOBALAMIN) 1000 MCG tablet Take 1,000 mcg by mouth daily.     No current facility-administered medications on file prior to visit.     BP 120/74   Pulse 62   Temp 98.2 F (36.8 C) (Oral)   Ht 5\' 1"  (1.549 m)   Wt 137 lb 1.9 oz (62.2 kg)   SpO2 97%   BMI 25.91 kg/m    Objective:  Physical Exam  Constitutional: She is oriented to person, place, and time. She appears well-nourished.  Neck: Neck supple.  Cardiovascular: Normal rate and regular rhythm.  Mild left lower extremity edema, trace pitting.  Pulmonary/Chest: Effort normal and breath sounds normal.  Abdominal: Soft. Bowel sounds are normal. There is no tenderness.  Neurological: She is alert and oriented to person, place, and time.  Skin: Skin is warm and dry.  Psychiatric: She has a normal mood and affect.          Assessment & Plan:

## 2017-10-03 NOTE — Assessment & Plan Note (Signed)
If trazodone is ineffective.  Will reinitiate amitriptyline given success on this medication for the past 40 years.  Discussed potential side effects of drowsiness, falls.  She verbalized understanding.

## 2017-10-03 NOTE — Patient Instructions (Addendum)
Constipation: Try the Amitiza and stool softener on a daily basis for now. Use the Miralax occasionally as needed.   Urinary Incontinence: Try the Vesicare 5 mg tablets for urinary symptoms. Please update me if you'd like to continue this script.   Sleep: Try the amitriptyline at bedtime.  IBS/Nausea: Try the promethazine sparingly as needed for nausea.   I sent refills for your lisinopril, metoprolol, Amitiza, gabapentin.   Complete lab work prior to leaving today. I will notify you of your results once received.   Schedule a follow up visit in 6 months or return sooner if needed.  It was a pleasure to meet you today! Please don't hesitate to call me with any questions. Welcome to [facility]!

## 2017-10-04 ENCOUNTER — Telehealth: Payer: Self-pay | Admitting: Primary Care

## 2017-10-08 ENCOUNTER — Encounter: Payer: Self-pay | Admitting: Primary Care

## 2017-10-08 DIAGNOSIS — E039 Hypothyroidism, unspecified: Secondary | ICD-10-CM

## 2017-10-08 DIAGNOSIS — R32 Unspecified urinary incontinence: Secondary | ICD-10-CM

## 2017-10-08 MED ORDER — LEVOTHYROXINE SODIUM 25 MCG PO TABS: ORAL_TABLET | ORAL | 3 refills | Status: DC

## 2017-10-08 MED ORDER — SOLIFENACIN SUCCINATE 5 MG PO TABS: 5.0000 mg | ORAL_TABLET | Freq: Every day | ORAL | 0 refills | Status: DC

## 2017-10-08 NOTE — Telephone Encounter (Signed)
Will you release labs to My Chart?

## 2017-10-08 NOTE — Telephone Encounter (Signed)
Orders have been released for patient to view.

## 2017-10-18 NOTE — Telephone Encounter (Signed)
Prior Auth for amitriptyline (ELAVIL) 25 MG tablet  Referral Number: [id number]  The period of coverage for this medication will be:  Effective Date:  24-Oct-202017         Expiration Date:   11/05/2017

## 2017-10-23 ENCOUNTER — Encounter: Payer: Self-pay | Admitting: Primary Care

## 2017-10-23 DIAGNOSIS — R32 Unspecified urinary incontinence: Secondary | ICD-10-CM

## 2017-10-23 MED ORDER — SOLIFENACIN SUCCINATE 5 MG PO TABS: 5.0000 mg | ORAL_TABLET | Freq: Every day | ORAL | 1 refills | Status: DC

## 2017-10-24 MED ORDER — SOLIFENACIN SUCCINATE 5 MG PO TABS: 5.0000 mg | ORAL_TABLET | Freq: Every day | ORAL | 1 refills | Status: DC

## 2017-11-12 ENCOUNTER — Encounter: Payer: Self-pay | Admitting: Primary Care

## 2017-11-12 DIAGNOSIS — G4700 Insomnia, unspecified: Secondary | ICD-10-CM

## 2017-11-12 MED ORDER — AMITRIPTYLINE HCL 50 MG PO TABS: 50.0000 mg | ORAL_TABLET | Freq: Every day | ORAL | 0 refills | Status: DC

## 2017-12-21 ENCOUNTER — Other Ambulatory Visit: Payer: Self-pay | Admitting: Primary Care

## 2017-12-21 ENCOUNTER — Encounter: Payer: Self-pay | Admitting: Primary Care

## 2017-12-21 DIAGNOSIS — K581 Irritable bowel syndrome with constipation: Secondary | ICD-10-CM

## 2017-12-21 NOTE — Telephone Encounter (Signed)
Ok to refill? Electronically refill request for promethazine (PHENERGAN) 12.5 MG tablet  Last seen and prescribed on 10/03/2017

## 2017-12-21 NOTE — Telephone Encounter (Signed)
Please ask patient why she's prescribed promethazine, this isn't something take routinely. How often dose she take it?

## 2017-12-23 DIAGNOSIS — N3001 Acute cystitis with hematuria: Secondary | ICD-10-CM | POA: Diagnosis not present

## 2017-12-23 DIAGNOSIS — R300 Dysuria: Secondary | ICD-10-CM | POA: Diagnosis not present

## 2017-12-25 NOTE — Telephone Encounter (Signed)
Left message for patient's niece to return my call regarding patient.

## 2017-12-25 NOTE — Telephone Encounter (Signed)
I have called patient on 12/24/2017 and she stated that is best regarding her medication to discuss with patient's niece.

## 2018-01-01 NOTE — Telephone Encounter (Signed)
Niece advised.  Appointment scheduled.

## 2018-01-01 NOTE — Telephone Encounter (Signed)
If they are needing/requesting home health then they'll need a face to face meeting/appointment with me before Medicare will consider covering. We will need to get more detail in terms of the need for home health.

## 2018-01-01 NOTE — Telephone Encounter (Addendum)
Was able to get a hold of patient's niece (on [place]). She stated she set out patient's medications. She have these medications on auto refill so all can be refill at the same time. Patient does have plenty right now. Patient may take up 3 times a week depending on her irritable bowel syndrome.  Also niece is asking since patient is 63 years, it may be a good idea about home health since patient is living by herself. Please advise.

## 2018-01-09 ENCOUNTER — Ambulatory Visit (INDEPENDENT_AMBULATORY_CARE_PROVIDER_SITE_OTHER): Payer: Medicare Other | Admitting: Primary Care

## 2018-01-09 ENCOUNTER — Encounter: Payer: Self-pay | Admitting: Primary Care

## 2018-01-09 VITALS — BP 110/70 | HR 60 | Temp 98.2°F | Wt 143.0 lb

## 2018-01-09 DIAGNOSIS — F0153 Vascular dementia, unspecified severity, with mood disturbance: Secondary | ICD-10-CM

## 2018-01-09 DIAGNOSIS — F329 Major depressive disorder, single episode, unspecified: Secondary | ICD-10-CM

## 2018-01-09 DIAGNOSIS — F0150 Vascular dementia without behavioral disturbance: Secondary | ICD-10-CM | POA: Diagnosis not present

## 2018-01-09 DIAGNOSIS — R269 Unspecified abnormalities of gait and mobility: Secondary | ICD-10-CM | POA: Diagnosis not present

## 2018-01-09 DIAGNOSIS — G4700 Insomnia, unspecified: Secondary | ICD-10-CM

## 2018-01-09 DIAGNOSIS — R296 Repeated falls: Secondary | ICD-10-CM

## 2018-01-09 NOTE — Assessment & Plan Note (Signed)
Recurrent use of OTC cough suppressant prior to bed could be causing some insomnia. Discussed to refrain from watching TV 1 hour prior to bedtime, limit naps during the day, read a book if difficulty falling asleep.  Will continue amitriptyline for now, stop melatonin. Try Unisom. Will have her hold ACE for recurrent cough so she'll stop using OTC cough suppressants.   She'll update in a few weeks regarding persistent cough.

## 2018-01-09 NOTE — Assessment & Plan Note (Addendum)
Referral placed to home health for PT to evaluate. Recommend gait training/balance/coordination.

## 2018-01-09 NOTE — Assessment & Plan Note (Signed)
Home health referral placed for any potential assistance in the home including ADL's and gait/balance training.

## 2018-01-09 NOTE — Progress Notes (Signed)
Subjective:    Patient ID: Brittany Hughes, female    DOB: May 24, 1928, 84 y.o.   MRN: [id number]  HPI  Ms. Brittany Hughes is an 84 year old female with a history of essential hypertension, vacsular dementia, chronic pain, recurrent falls, urinary incontinence, insomnia who presents today requesting home health and also to discuss insomnia.  1) Insomnia: Currently managed on amitriptyline 50 mg for which she's taken for 40 years. She was initially evaluated as a new patient in December 2018, managed on Trazodone 200 mg (taken off of amitriptyline by another provider) with inability to fall asleep. She was re-initiated on amitriptyline at a dose of 25 mg, then increased to 50 mg. She continues to experience difficulty falling asleep, she'll lay awake for 3-4 hours sometimes. She doesn't wake during the night once asleep.  She does watch TV prior to bedtime, and she does nap during the day. She's also on Melatonin 10 mg nightly without improvement.   2) Home Health: She currently lives alone and is requesting a home health aid to assist with house hold chores, assist with bathing, assist with some meal preparation. She'd also like evaluation for home physical therapy for balance issues, does have a history of recurrent falls. She does not wish to go with [facility].  3) Chronic Cough: Located to the anterior throat for which she feels nightly. This has been present for the past 2 months. She denies fevers, chills, congestion, esophageal burning, abdominal pain. She's compliant to her omeprazole. She is managed on lisinopril 5 mg daily. Because of the cough she's been taking Delsym at night, every night for the past 2 months.  Review of Systems  Constitutional: Negative for fever.  HENT: Negative for congestion.   Respiratory: Positive for cough. Negative for shortness of breath.   Cardiovascular: Negative for chest pain.  Psychiatric/Behavioral: Positive for sleep disturbance.       Past Medical History:    Diagnosis Date  . Atrophy of vulva   . Carotid stenosis   . Chronic kidney disease   . Depression   . Dermatophytosis of nail   . DVT (deep venous thrombosis) ([place]) 01/06/2014   RLE  . Dysuria   . GERD (gastroesophageal reflux disease)   . Hyperglycemia 04/18/2016  . Hyperlipidemia   . Hyperpotassemia   . Hypertension   . Macular degeneration    "left eye; growth behind that" (01/06/2014)  . Mild cognitive impairment, so stated   . Nephrolithiasis   . Osteoporosis, unspecified   . Other B-complex deficiencies   . Other diseases of larynx   . Other voice and resonance disorders   . Reflux esophagitis   . Seizures ([place])    "one, years ago" (01/06/2014)  . Unspecified hereditary and idiopathic peripheral neuropathy   . Unspecified malignant neoplasm of skin, site unspecified    left foot  . Unspecified urinary incontinence   . Vitamin deficiency      Social History   Socioeconomic History  . Marital status: Widowed    Spouse name: Not on file  . Number of children: Not on file  . Years of education: Not on file  . Highest education level: Not on file  Social Needs  . Financial resource strain: Not on file  . Food insecurity - worry: Not on file  . Food insecurity - inability: Not on file  . Transportation needs - medical: Not on file  . Transportation needs - non-medical: Not on file  Occupational History  .  Not on file  Tobacco Use  . Smoking status: Former Smoker    Packs/day: 0.50    Years: 60.00    Pack years: 30.00    Types: Cigarettes    Last attempt to quit: 10/31/2011    Years since quitting: 6.1  . Smokeless tobacco: Never Used  Substance and Sexual Activity  . Alcohol use: No  . Drug use: No  . Sexual activity: No  Other Topics Concern  . Not on file  Social History Narrative  . Not on file    Past Surgical History:  Procedure Laterality Date  . CAROTID ENDARTERECTOMY Right 2006  . CATARACT EXTRACTION W/ INTRAOCULAR LENS  IMPLANT, BILATERAL  Bilateral 1990's  . DILATION AND CURETTAGE OF UTERUS    . EXCISIONAL HEMORRHOIDECTOMY  1970's?  Marland Kitchen STOMACH SURGERY  ~ 1960   "had ulcers for years; left scar tissue; did OR" (01/06/2014)  . VAGINAL HYSTERECTOMY  ~ 1980    Family History  Problem Relation Age of Onset  . Heart disease Mother 61  . Kidney disease Father   . Diabetes Sister   . Heart disease Brother 53  . Heart attack Son 61  . Cancer Son        Throat  . Stroke Brother   . Cancer Brother   . Cancer Sister        Kidney    Allergies  Allergen Reactions  . Codeine Other (See Comments)    Possibly crazy actions  . Colace [Docusate Calcium] Nausea Only    Current Outpatient Medications on File Prior to Visit  Medication Sig Dispense Refill  . amitriptyline (ELAVIL) 50 MG tablet Take 1 tablet (50 mg total) by mouth at bedtime. 90 tablet 0  . antiseptic oral rinse (BIOTENE) LIQD 1 application by Mouth Rinse route every 4 (four) hours as needed for dry mouth.    . cholecalciferol (VITAMIN D) 1000 units tablet Take 1,000 Units by mouth daily.    Marland Kitchen gabapentin (NEURONTIN) 300 MG capsule Take 1 capsule (300 mg total) by mouth at bedtime. 90 capsule 1  . levothyroxine (SYNTHROID, LEVOTHROID) 25 MCG tablet Take 1 tablet by mouth every morning on an empty stomach with a full glass of water. 90 tablet 3  . lisinopril (PRINIVIL,ZESTRIL) 5 MG tablet Take 1 tablet (5 mg total) by mouth daily. 90 tablet 1  . lubiprostone (AMITIZA) 24 MCG capsule Take 1 capsule (24 mcg total) by mouth 2 (two) times daily with a meal. 180 capsule 1  . Melatonin 3 MG TABS Take 1 tablet (3 mg total) by mouth daily. 30 tablet 0  . metoprolol succinate (TOPROL-XL) 50 MG 24 hr tablet Take 1 tablet (50 mg total) by mouth daily. 90 tablet 1  . omeprazole (PRILOSEC) 20 MG capsule TAKE 1 CAPSULE (20 MG TOTAL) BY MOUTH DAILY. FOR ACID REFLUX 90 capsule 1  . Polyethyl Glycol-Propyl Glycol (SYSTANE) 0.4-0.3 % SOLN Apply 1 drop to eye daily as needed (for dry  eyes).    . promethazine (PHENERGAN) 12.5 MG tablet Take 1 tablet (12.5 mg total) by mouth every 12 (twelve) hours as needed for nausea or vomiting. 90 tablet 0  . solifenacin (VESICARE) 5 MG tablet Take 1 tablet (5 mg total) by mouth daily. 90 tablet 1  . vitamin B-12 (CYANOCOBALAMIN) 1000 MCG tablet Take 1,000 mcg by mouth daily.     No current facility-administered medications on file prior to visit.     BP 110/70 (BP Location: Left Arm, Patient  Position: Sitting, Cuff Size: Normal)   Pulse 60   Temp 98.2 F (36.8 C) (Oral)   Wt 143 lb (64.9 kg)   SpO2 99%   BMI 27.02 kg/m    Objective:   Physical Exam  Constitutional: She is oriented to person, place, and time. She appears well-nourished.  Neck: Neck supple.  Cardiovascular: Normal rate and regular rhythm.  Pulmonary/Chest: Effort normal and breath sounds normal.  Neurological: She is alert and oriented to person, place, and time.  Skin: Skin is warm and dry.  Psychiatric: She has a normal mood and affect.          Assessment & Plan:  Recurrent Cough:  Present nightly x 2 months. Compliant to PPI, but will consider GERD as potential cause. Hold ACE for 2 weeks, patient and niece will update.  Stop OTC cough suppressant.   Pleas Koch, NP

## 2018-01-09 NOTE — Patient Instructions (Addendum)
Try Unisom for sleeping. Stop Melatonin.   Hold lisinopril 5 mg tablets for the next 1-2 weeks, please update me in a few weeks as this should help with the cough. Monitor your blood pressure for the next two weeks and notify me for readings at or above 140/90.  You will be contacted regarding your referral to home health.  Please let us know if you have not been contacted within one week.   Please schedule a follow up appointment in 6 months.

## 2018-01-10 ENCOUNTER — Other Ambulatory Visit: Payer: Self-pay | Admitting: Primary Care

## 2018-01-10 DIAGNOSIS — R269 Unspecified abnormalities of gait and mobility: Secondary | ICD-10-CM

## 2018-01-10 DIAGNOSIS — R296 Repeated falls: Secondary | ICD-10-CM

## 2018-01-15 DIAGNOSIS — R2689 Other abnormalities of gait and mobility: Secondary | ICD-10-CM | POA: Diagnosis not present

## 2018-01-15 DIAGNOSIS — E039 Hypothyroidism, unspecified: Secondary | ICD-10-CM | POA: Diagnosis not present

## 2018-01-15 DIAGNOSIS — G629 Polyneuropathy, unspecified: Secondary | ICD-10-CM | POA: Diagnosis not present

## 2018-01-15 DIAGNOSIS — G894 Chronic pain syndrome: Secondary | ICD-10-CM | POA: Diagnosis not present

## 2018-01-15 DIAGNOSIS — F0390 Unspecified dementia without behavioral disturbance: Secondary | ICD-10-CM | POA: Diagnosis not present

## 2018-01-15 DIAGNOSIS — F419 Anxiety disorder, unspecified: Secondary | ICD-10-CM | POA: Diagnosis not present

## 2018-01-15 DIAGNOSIS — K589 Irritable bowel syndrome without diarrhea: Secondary | ICD-10-CM | POA: Diagnosis not present

## 2018-01-15 DIAGNOSIS — Z87891 Personal history of nicotine dependence: Secondary | ICD-10-CM | POA: Diagnosis not present

## 2018-01-15 DIAGNOSIS — D631 Anemia in chronic kidney disease: Secondary | ICD-10-CM | POA: Diagnosis not present

## 2018-01-15 DIAGNOSIS — F329 Major depressive disorder, single episode, unspecified: Secondary | ICD-10-CM | POA: Diagnosis not present

## 2018-01-15 DIAGNOSIS — R296 Repeated falls: Secondary | ICD-10-CM | POA: Diagnosis not present

## 2018-01-15 DIAGNOSIS — E785 Hyperlipidemia, unspecified: Secondary | ICD-10-CM | POA: Diagnosis not present

## 2018-01-15 DIAGNOSIS — N183 Chronic kidney disease, stage 3 (moderate): Secondary | ICD-10-CM | POA: Diagnosis not present

## 2018-01-15 DIAGNOSIS — K219 Gastro-esophageal reflux disease without esophagitis: Secondary | ICD-10-CM | POA: Diagnosis not present

## 2018-01-15 DIAGNOSIS — I129 Hypertensive chronic kidney disease with stage 1 through stage 4 chronic kidney disease, or unspecified chronic kidney disease: Secondary | ICD-10-CM | POA: Diagnosis not present

## 2018-01-16 ENCOUNTER — Telehealth: Payer: Self-pay | Admitting: Primary Care

## 2018-01-16 NOTE — Telephone Encounter (Signed)
Copied from [place] [id number]. Topic: Quick Communication - See Telephone Encounter >> Jan 16, 2018  1:04 PM Ether Griffins B wrote: CRM for notification. See Telephone encounter for:  Claiborne Billings PT with [facility] needing verbal orders for PT 1-2x a week for 5 weeks. Call back number [phone].  01/16/18.

## 2018-01-16 NOTE — Telephone Encounter (Signed)
Approved.  

## 2018-01-17 NOTE — Telephone Encounter (Signed)
Claiborne Billings called back and she was notified the approval of the verbal order.

## 2018-01-17 NOTE — Telephone Encounter (Signed)
Message left for Claiborne Billings to return my call.

## 2018-01-21 ENCOUNTER — Other Ambulatory Visit: Payer: Self-pay | Admitting: Primary Care

## 2018-01-21 DIAGNOSIS — G4700 Insomnia, unspecified: Secondary | ICD-10-CM

## 2018-01-22 DIAGNOSIS — R296 Repeated falls: Secondary | ICD-10-CM | POA: Diagnosis not present

## 2018-01-22 DIAGNOSIS — G894 Chronic pain syndrome: Secondary | ICD-10-CM | POA: Diagnosis not present

## 2018-01-22 DIAGNOSIS — D631 Anemia in chronic kidney disease: Secondary | ICD-10-CM | POA: Diagnosis not present

## 2018-01-22 DIAGNOSIS — R2689 Other abnormalities of gait and mobility: Secondary | ICD-10-CM | POA: Diagnosis not present

## 2018-01-22 DIAGNOSIS — N183 Chronic kidney disease, stage 3 (moderate): Secondary | ICD-10-CM | POA: Diagnosis not present

## 2018-01-22 DIAGNOSIS — I129 Hypertensive chronic kidney disease with stage 1 through stage 4 chronic kidney disease, or unspecified chronic kidney disease: Secondary | ICD-10-CM | POA: Diagnosis not present

## 2018-01-23 ENCOUNTER — Encounter: Payer: Self-pay | Admitting: Primary Care

## 2018-01-24 DIAGNOSIS — D631 Anemia in chronic kidney disease: Secondary | ICD-10-CM | POA: Diagnosis not present

## 2018-01-24 DIAGNOSIS — I129 Hypertensive chronic kidney disease with stage 1 through stage 4 chronic kidney disease, or unspecified chronic kidney disease: Secondary | ICD-10-CM | POA: Diagnosis not present

## 2018-01-24 DIAGNOSIS — R296 Repeated falls: Secondary | ICD-10-CM | POA: Diagnosis not present

## 2018-01-24 DIAGNOSIS — G894 Chronic pain syndrome: Secondary | ICD-10-CM | POA: Diagnosis not present

## 2018-01-24 DIAGNOSIS — R2689 Other abnormalities of gait and mobility: Secondary | ICD-10-CM | POA: Diagnosis not present

## 2018-01-24 DIAGNOSIS — N183 Chronic kidney disease, stage 3 (moderate): Secondary | ICD-10-CM | POA: Diagnosis not present

## 2018-01-29 DIAGNOSIS — D631 Anemia in chronic kidney disease: Secondary | ICD-10-CM | POA: Diagnosis not present

## 2018-01-29 DIAGNOSIS — N183 Chronic kidney disease, stage 3 (moderate): Secondary | ICD-10-CM | POA: Diagnosis not present

## 2018-01-29 DIAGNOSIS — R296 Repeated falls: Secondary | ICD-10-CM | POA: Diagnosis not present

## 2018-01-29 DIAGNOSIS — G894 Chronic pain syndrome: Secondary | ICD-10-CM | POA: Diagnosis not present

## 2018-01-29 DIAGNOSIS — I129 Hypertensive chronic kidney disease with stage 1 through stage 4 chronic kidney disease, or unspecified chronic kidney disease: Secondary | ICD-10-CM | POA: Diagnosis not present

## 2018-01-29 DIAGNOSIS — R2689 Other abnormalities of gait and mobility: Secondary | ICD-10-CM | POA: Diagnosis not present

## 2018-01-31 ENCOUNTER — Encounter (INDEPENDENT_AMBULATORY_CARE_PROVIDER_SITE_OTHER): Payer: Medicare Other | Admitting: Ophthalmology

## 2018-02-01 DIAGNOSIS — G894 Chronic pain syndrome: Secondary | ICD-10-CM | POA: Diagnosis not present

## 2018-02-01 DIAGNOSIS — D631 Anemia in chronic kidney disease: Secondary | ICD-10-CM | POA: Diagnosis not present

## 2018-02-01 DIAGNOSIS — R296 Repeated falls: Secondary | ICD-10-CM | POA: Diagnosis not present

## 2018-02-01 DIAGNOSIS — I129 Hypertensive chronic kidney disease with stage 1 through stage 4 chronic kidney disease, or unspecified chronic kidney disease: Secondary | ICD-10-CM | POA: Diagnosis not present

## 2018-02-01 DIAGNOSIS — R2689 Other abnormalities of gait and mobility: Secondary | ICD-10-CM | POA: Diagnosis not present

## 2018-02-01 DIAGNOSIS — N183 Chronic kidney disease, stage 3 (moderate): Secondary | ICD-10-CM | POA: Diagnosis not present

## 2018-02-05 DIAGNOSIS — R296 Repeated falls: Secondary | ICD-10-CM | POA: Diagnosis not present

## 2018-02-05 DIAGNOSIS — G894 Chronic pain syndrome: Secondary | ICD-10-CM | POA: Diagnosis not present

## 2018-02-05 DIAGNOSIS — R2689 Other abnormalities of gait and mobility: Secondary | ICD-10-CM | POA: Diagnosis not present

## 2018-02-05 DIAGNOSIS — D631 Anemia in chronic kidney disease: Secondary | ICD-10-CM | POA: Diagnosis not present

## 2018-02-05 DIAGNOSIS — I129 Hypertensive chronic kidney disease with stage 1 through stage 4 chronic kidney disease, or unspecified chronic kidney disease: Secondary | ICD-10-CM | POA: Diagnosis not present

## 2018-02-05 DIAGNOSIS — N183 Chronic kidney disease, stage 3 (moderate): Secondary | ICD-10-CM | POA: Diagnosis not present

## 2018-02-06 ENCOUNTER — Encounter (INDEPENDENT_AMBULATORY_CARE_PROVIDER_SITE_OTHER): Payer: Medicare Other | Admitting: Ophthalmology

## 2018-02-07 ENCOUNTER — Telehealth: Payer: Self-pay | Admitting: Primary Care

## 2018-02-07 DIAGNOSIS — I129 Hypertensive chronic kidney disease with stage 1 through stage 4 chronic kidney disease, or unspecified chronic kidney disease: Secondary | ICD-10-CM | POA: Diagnosis not present

## 2018-02-07 DIAGNOSIS — G894 Chronic pain syndrome: Secondary | ICD-10-CM | POA: Diagnosis not present

## 2018-02-07 DIAGNOSIS — R296 Repeated falls: Secondary | ICD-10-CM | POA: Diagnosis not present

## 2018-02-07 DIAGNOSIS — N183 Chronic kidney disease, stage 3 (moderate): Secondary | ICD-10-CM | POA: Diagnosis not present

## 2018-02-07 DIAGNOSIS — D631 Anemia in chronic kidney disease: Secondary | ICD-10-CM | POA: Diagnosis not present

## 2018-02-07 DIAGNOSIS — R2689 Other abnormalities of gait and mobility: Secondary | ICD-10-CM | POA: Diagnosis not present

## 2018-02-07 NOTE — Telephone Encounter (Signed)
Please schedule a 30 min appointment for [place] to discuss. Would like to do some labs and talk about treatment for insomnia.

## 2018-02-07 NOTE — Telephone Encounter (Signed)
I spoke with Brittany Hughes; due to increased weakness  Pt is having problem completing physical therapy. Some days she cannot do her daily walk. Pt has not fallen.

## 2018-02-07 NOTE — Telephone Encounter (Signed)
Copied from [place] [phone]. Topic: Quick Communication - See Telephone Encounter >> Feb 07, 2018 11:19 AM Synthia Innocent wrote: CRM for notification. See Telephone encounter for: 02/07/18. Patient has not been sleeping well, tired all the time. Is having a trouble with therapy. All vitals are stable, just getting weaker. Please advise

## 2018-02-08 NOTE — Telephone Encounter (Signed)
Spoken to patient's niece and schedule the appt on 02/15/2018

## 2018-02-12 ENCOUNTER — Telehealth: Payer: Self-pay

## 2018-02-12 DIAGNOSIS — G894 Chronic pain syndrome: Secondary | ICD-10-CM | POA: Diagnosis not present

## 2018-02-12 DIAGNOSIS — R296 Repeated falls: Secondary | ICD-10-CM | POA: Diagnosis not present

## 2018-02-12 DIAGNOSIS — D631 Anemia in chronic kidney disease: Secondary | ICD-10-CM | POA: Diagnosis not present

## 2018-02-12 DIAGNOSIS — I129 Hypertensive chronic kidney disease with stage 1 through stage 4 chronic kidney disease, or unspecified chronic kidney disease: Secondary | ICD-10-CM | POA: Diagnosis not present

## 2018-02-12 DIAGNOSIS — R2689 Other abnormalities of gait and mobility: Secondary | ICD-10-CM | POA: Diagnosis not present

## 2018-02-12 DIAGNOSIS — N183 Chronic kidney disease, stage 3 (moderate): Secondary | ICD-10-CM | POA: Diagnosis not present

## 2018-02-12 NOTE — Telephone Encounter (Signed)
Approved. Make sure she's drinking enough water and eating enough fiber during the day.  Please have her come see me if symptoms persist.

## 2018-02-12 NOTE — Telephone Encounter (Signed)
Copied from [place]. Topic: Inquiry >> Feb 12, 2018  3:57 PM Pricilla Handler wrote: Reason for CRM: Claiborne Billings from [facility] [phone]) called requesting verbal PT orders. Claiborne Billings has requested the following: PT Once or Twice a week for 3 to 4 weeks. Patient is also having trouble Urinating and her groin hurts while having a BM.  Thank You!!!

## 2018-02-13 NOTE — Telephone Encounter (Signed)
Noted  

## 2018-02-13 NOTE — Telephone Encounter (Signed)
Jen with [facility] [place] [facility] [place] advised as instructed and voiced understanding; Claiborne Billings said pt has appt to see Anda Kraft on 02/15/18 at 12 noon.

## 2018-02-13 NOTE — Telephone Encounter (Signed)
Message left for [facility] from [place]  to return my call.

## 2018-02-15 ENCOUNTER — Ambulatory Visit (INDEPENDENT_AMBULATORY_CARE_PROVIDER_SITE_OTHER): Payer: Medicare Other | Admitting: Primary Care

## 2018-02-15 ENCOUNTER — Encounter: Payer: Self-pay | Admitting: Primary Care

## 2018-02-15 VITALS — BP 140/68 | HR 62 | Temp 98.0°F | Ht 61.0 in | Wt 145.0 lb

## 2018-02-15 DIAGNOSIS — K649 Unspecified hemorrhoids: Secondary | ICD-10-CM | POA: Diagnosis not present

## 2018-02-15 DIAGNOSIS — J309 Allergic rhinitis, unspecified: Secondary | ICD-10-CM | POA: Diagnosis not present

## 2018-02-15 DIAGNOSIS — K5909 Other constipation: Secondary | ICD-10-CM

## 2018-02-15 DIAGNOSIS — F329 Major depressive disorder, single episode, unspecified: Secondary | ICD-10-CM

## 2018-02-15 DIAGNOSIS — N183 Chronic kidney disease, stage 3 unspecified: Secondary | ICD-10-CM

## 2018-02-15 DIAGNOSIS — F32A Depression, unspecified: Secondary | ICD-10-CM

## 2018-02-15 DIAGNOSIS — F419 Anxiety disorder, unspecified: Secondary | ICD-10-CM

## 2018-02-15 DIAGNOSIS — K219 Gastro-esophageal reflux disease without esophagitis: Secondary | ICD-10-CM | POA: Diagnosis not present

## 2018-02-15 DIAGNOSIS — G4700 Insomnia, unspecified: Secondary | ICD-10-CM | POA: Diagnosis not present

## 2018-02-15 DIAGNOSIS — I10 Essential (primary) hypertension: Secondary | ICD-10-CM | POA: Diagnosis not present

## 2018-02-15 LAB — CBC WITH DIFFERENTIAL/PLATELET
BASOS ABS: 0 10*3/uL (ref 0.0–0.1)
Basophils Relative: 0.5 % (ref 0.0–3.0)
EOS PCT: 1.4 % (ref 0.0–5.0)
Eosinophils Absolute: 0.1 10*3/uL (ref 0.0–0.7)
HEMATOCRIT: 34.3 % — AB (ref 36.0–46.0)
HEMOGLOBIN: 11 g/dL — AB (ref 12.0–15.0)
LYMPHS PCT: 20.1 % (ref 12.0–46.0)
Lymphs Abs: 1.7 10*3/uL (ref 0.7–4.0)
MCHC: 32 g/dL (ref 30.0–36.0)
MCV: 84.1 fl (ref 78.0–100.0)
MONOS PCT: 5.9 % (ref 3.0–12.0)
Monocytes Absolute: 0.5 10*3/uL (ref 0.1–1.0)
Neutro Abs: 6 10*3/uL (ref 1.4–7.7)
Neutrophils Relative %: 72.1 % (ref 43.0–77.0)
Platelets: 196 10*3/uL (ref 150.0–400.0)
RBC: 4.08 Mil/uL (ref 3.87–5.11)
RDW: 18 % — ABNORMAL HIGH (ref 11.5–15.5)
WBC: 8.3 10*3/uL (ref 4.0–10.5)

## 2018-02-15 LAB — MAGNESIUM: Magnesium: 1.9 mg/dL (ref 1.5–2.5)

## 2018-02-15 LAB — URINALYSIS, ROUTINE W REFLEX MICROSCOPIC
Bilirubin Urine: NEGATIVE
Ketones, ur: NEGATIVE
Nitrite: NEGATIVE
RBC / HPF: NONE SEEN (ref 0–?)
Specific Gravity, Urine: 1.015 (ref 1.000–1.030)
Total Protein, Urine: NEGATIVE
Urine Glucose: NEGATIVE
Urobilinogen, UA: 0.2 (ref 0.0–1.0)
pH: 6 (ref 5.0–8.0)

## 2018-02-15 LAB — RENAL FUNCTION PANEL
Albumin: 4 g/dL (ref 3.5–5.2)
BUN: 14 mg/dL (ref 6–23)
CALCIUM: 9.1 mg/dL (ref 8.4–10.5)
CO2: 29 meq/L (ref 19–32)
Chloride: 104 mEq/L (ref 96–112)
Creatinine, Ser: 1.1 mg/dL (ref 0.40–1.20)
GFR: 49.88 mL/min — AB (ref >60.00)
Glucose, Bld: 94 mg/dL (ref 70–99)
PHOSPHORUS: 3.6 mg/dL (ref 2.3–4.6)
POTASSIUM: 4.5 meq/L (ref 3.5–5.1)
Sodium: 140 mEq/L (ref 135–145)

## 2018-02-15 LAB — VITAMIN D 25 HYDROXY (VIT D DEFICIENCY, FRACTURES): VITD: 29.75 ng/mL — ABNORMAL LOW (ref 30.00–100.00)

## 2018-02-15 MED ORDER — SERTRALINE HCL 25 MG PO TABS: ORAL_TABLET | ORAL | 1 refills | Status: DC

## 2018-02-15 MED ORDER — CETIRIZINE HCL 10 MG PO TABS: ORAL_TABLET | ORAL | 1 refills | Status: DC

## 2018-02-15 MED ORDER — HYDROCORTISONE ACETATE 25 MG RE SUPP: 25.0000 mg | Freq: Two times a day (BID) | RECTAL | 0 refills | Status: DC

## 2018-02-15 NOTE — Assessment & Plan Note (Signed)
Amitriptyline not effective, wean off slowly. Start Zoloft once daily 7 days after weaning from amitriptyline. Continue to monitor. Follow up in 6 weeks.

## 2018-02-15 NOTE — Assessment & Plan Note (Addendum)
Labs ordered today per nephrology request. Will fax to [place], Dr. Posey Pronto once received.

## 2018-02-15 NOTE — Assessment & Plan Note (Addendum)
Several non thrombosed external hemorrhoids on exam. Rx for Anusol [facility] suppositories sent to pharmacy. Continue OTC cream.   Consider GI referral if symptoms persist. Suspect pelvic discomfort secondary to hemorrhoids and potential rectocele. Continue Colace, add in Miralax 2-3 times weekly for softer stools.

## 2018-02-15 NOTE — Assessment & Plan Note (Signed)
Overall stable without lisinopril. Will resume lisinopril for renal protection as she continues to cough HS. Will have her follow up with nephrology as scheduled.

## 2018-02-15 NOTE — Assessment & Plan Note (Signed)
Hemorrhoids on exam, likely from straining during bowel movements. Will have her continue Colace, add in Miralax 2-3 times weekly.

## 2018-02-15 NOTE — Patient Instructions (Addendum)
Continue colace once daily.   Start Miralax 2-3 times weekly for firm stool/difficulty defecating. Please call me if you experience diarrhea.  Resume your lisinopril 5 mg tablets for kidney protection.   Start weaning off of amitriptyline. Take 1/2 tablet once nightly for 10 nights then stop.   Start sertraline (Zoloft) 25 mg tablets for anxiety and depression in 7 days. Take 1 tablet by mouth once daily.  Start cetirizine (Zyrtec) 10 mg tablets for allergies at bedtime.   Insert the hydrocortisone suppository twice daily for 6 days for hemorrhoids.   Stop by the lab prior to leaving today. I will notify you of your results once received.   Please schedule a follow up visit in 6 weeks for depression and anxiety. Call me sooner if you have any problems.   It was a pleasure to see you today!

## 2018-02-15 NOTE — Assessment & Plan Note (Signed)
Very likely contributing to insomnia.  PHQ 9 score of 16 and GAD 7 score of 11 today.  Amitriptyline is no longer helpful, will wean her off slowly. Add in low dose Zoloft after 7 days of weaning off of amitriptyline.   Follow up in 6 weeks.

## 2018-02-15 NOTE — Assessment & Plan Note (Signed)
Suspect night time post nasal drip to be the cause of her cough. Rx for Zyrtec 10 mg tablets HS sent to pharmacy. Will follow up with her in 6 weeks.

## 2018-02-15 NOTE — Progress Notes (Signed)
Subjective:    Patient ID: Brittany Hughes, female    DOB: 1928-05-20, 84 y.o.   MRN: [id number]  HPI  Brittany Hughes is an 84 year old female who presents today with multiple complaints. She is also needing labs drawn for an upcoming Nephrology appointment.   1) Insomnia/Anxiety/Depression: Chronic. Currently managed on amitriptyline 50 mg HS. Has tried Melatonin and Unisom without improvement. She was managed on amitriptyline for years with improvement in insomnia. Failed Trazodone. During her last visit she was using an OTC cough suppressant at bedtime for chronic dry cough so her lisinopril was held as it was assumed to be the cause.   She's not noticed improvement in her insomnia but endorses today that she has mind racing thoughts at bedtime about people she's lost during her life. GAD 7 score of 11 and PHQ 9 score of 16.   She was once managed on alprazolam and lorazepam in the past.   2) Essential Hypertension: Currently managed on metoprolol succinate 50 mg and lisinopril 5 mg. During her last visit her lisinopril was held due to persistent dry cough at night.   Since her last visit she's continued to notice a cough which is congested with phlegm. She's not noticed any improvement since removing lisinopril. She denies esophageal burning, she is compliant to her omeprazole. She is not taking an antihistamine.   She does not check her BP at home, she does have a cuff.  BP Readings from Last 3 Encounters:  02/15/18 140/68  01/09/18 110/70  10/03/17 120/74   3) Pelvic Pain/Rectal Pain: Present for the past one year. History of bladder tacking and hemorrhoids in the past. Occurs when straining when having a bowel movement. She'll lean to one side and the discomfort subsides. She'll sometimes have to pull the stool from her anus with improvement in discomfort. Also with history of hemorrhoids and fecal impaction. She is taking colace once daily. She had an episode of diarrhea this morning.  She's been using preparation H with some improvement in rectal pain. She denies obvious rectal bleeding.  Review of Systems  Constitutional: Negative for fever.  Respiratory: Negative for shortness of breath.   Cardiovascular: Negative for chest pain.  Gastrointestinal: Positive for constipation and rectal pain. Negative for abdominal pain, blood in stool and vomiting.  Genitourinary: Negative for vaginal discharge.       Pelvic discomfort  Neurological: Negative for dizziness.  Psychiatric/Behavioral: Positive for sleep disturbance. The patient is nervous/anxious.        See HPI        Past Medical History:  Diagnosis Date  . Atrophy of vulva   . Carotid stenosis   . Chronic kidney disease   . Depression   . Dermatophytosis of nail   . DVT (deep venous thrombosis) ([place]) 01/06/2014   RLE  . Dysuria   . GERD (gastroesophageal reflux disease)   . Hyperglycemia 04/18/2016  . Hyperlipidemia   . Hyperpotassemia   . Hypertension   . Macular degeneration    "left eye; growth behind that" (01/06/2014)  . Mild cognitive impairment, so stated   . Nephrolithiasis   . Osteoporosis, unspecified   . Other B-complex deficiencies   . Other diseases of larynx   . Other voice and resonance disorders   . Reflux esophagitis   . Seizures ([place])    "one, years ago" (01/06/2014)  . Unspecified hereditary and idiopathic peripheral neuropathy   . Unspecified malignant neoplasm of skin, site unspecified  left foot  . Unspecified urinary incontinence   . Vitamin deficiency      Social History   Socioeconomic History  . Marital status: Widowed    Spouse name: Not on file  . Number of children: Not on file  . Years of education: Not on file  . Highest education level: Not on file  Occupational History  . Not on file  Social Needs  . Financial resource strain: Not on file  . Food insecurity:    Worry: Not on file    Inability: Not on file  . Transportation needs:    Medical: Not on file      Non-medical: Not on file  Tobacco Use  . Smoking status: Former Smoker    Packs/day: 0.50    Years: 60.00    Pack years: 30.00    Types: Cigarettes    Last attempt to quit: 10/31/2011    Years since quitting: 6.2  . Smokeless tobacco: Never Used  Substance and Sexual Activity  . Alcohol use: No  . Drug use: No  . Sexual activity: Never  Lifestyle  . Physical activity:    Days per week: Not on file    Minutes per session: Not on file  . Stress: Not on file  Relationships  . Social connections:    Talks on phone: Not on file    Gets together: Not on file    Attends religious service: Not on file    Active member of club or organization: Not on file    Attends meetings of clubs or organizations: Not on file    Relationship status: Not on file  . Intimate partner violence:    Fear of current or ex partner: Not on file    Emotionally abused: Not on file    Physically abused: Not on file    Forced sexual activity: Not on file  Other Topics Concern  . Not on file  Social History Narrative  . Not on file    Past Surgical History:  Procedure Laterality Date  . CAROTID ENDARTERECTOMY Right 2006  . CATARACT EXTRACTION W/ INTRAOCULAR LENS  IMPLANT, BILATERAL Bilateral 1990's  . DILATION AND CURETTAGE OF UTERUS    . EXCISIONAL HEMORRHOIDECTOMY  1970's?  Marland Kitchen STOMACH SURGERY  ~ 1960   "had ulcers for years; left scar tissue; did OR" (01/06/2014)  . VAGINAL HYSTERECTOMY  ~ 1980    Family History  Problem Relation Age of Onset  . Heart disease Mother 56  . Kidney disease Father   . Diabetes Sister   . Heart disease Brother 81  . Heart attack Son 43  . Cancer Son        Throat  . Stroke Brother   . Cancer Brother   . Cancer Sister        Kidney    Allergies  Allergen Reactions  . Codeine Other (See Comments)    Possibly crazy actions  . Colace [Docusate Calcium] Nausea Only    Current Outpatient Medications on File Prior to Visit  Medication Sig Dispense Refill   . antiseptic oral rinse (BIOTENE) LIQD 1 application by Mouth Rinse route every 4 (four) hours as needed for dry mouth.    . cholecalciferol (VITAMIN D) 1000 units tablet Take 1,000 Units by mouth daily.    Marland Kitchen docusate sodium (COLACE) 100 MG capsule Take 100 mg by mouth daily.    Marland Kitchen gabapentin (NEURONTIN) 300 MG capsule Take 1 capsule (300 mg total) by mouth at  bedtime. 90 capsule 1  . levothyroxine (SYNTHROID, LEVOTHROID) 25 MCG tablet Take 1 tablet by mouth every morning on an empty stomach with a full glass of water. 90 tablet 3  . lubiprostone (AMITIZA) 24 MCG capsule Take 1 capsule (24 mcg total) by mouth 2 (two) times daily with a meal. 180 capsule 1  . Melatonin 3 MG TABS Take 1 tablet (3 mg total) by mouth daily. 30 tablet 0  . metoprolol succinate (TOPROL-XL) 50 MG 24 hr tablet Take 1 tablet (50 mg total) by mouth daily. 90 tablet 1  . omeprazole (PRILOSEC) 20 MG capsule TAKE 1 CAPSULE (20 MG TOTAL) BY MOUTH DAILY. FOR ACID REFLUX 90 capsule 1  . Polyethyl Glycol-Propyl Glycol (SYSTANE) 0.4-0.3 % SOLN Apply 1 drop to eye daily as needed (for dry eyes).    . promethazine (PHENERGAN) 12.5 MG tablet Take 1 tablet (12.5 mg total) by mouth every 12 (twelve) hours as needed for nausea or vomiting. 90 tablet 0  . solifenacin (VESICARE) 5 MG tablet Take 1 tablet (5 mg total) by mouth daily. 90 tablet 1  . vitamin B-12 (CYANOCOBALAMIN) 1000 MCG tablet Take 1,000 mcg by mouth daily.    Marland Kitchen lisinopril (PRINIVIL,ZESTRIL) 5 MG tablet Take 1 tablet (5 mg total) by mouth daily. (Patient not taking: Reported on 02/15/2018) 90 tablet 1   No current facility-administered medications on file prior to visit.     BP 140/68   Pulse 62   Temp 98 F (36.7 C) (Oral)   Ht 5\' 1"  (1.549 m)   Wt 145 lb (65.8 kg)   SpO2 97%   BMI 27.40 kg/m    Objective:   Physical Exam  Constitutional: She appears well-nourished.  Neck: Neck supple.  Cardiovascular: Normal rate and regular rhythm.  Pulmonary/Chest: Effort  normal and breath sounds normal.  Abdominal: Soft. Bowel sounds are normal. There is no tenderness.  Genitourinary: Rectal exam shows external hemorrhoid. No vaginal discharge found.  Genitourinary Comments: Unable to advance speculum into vaginal canal due to discomfort. Likely rectocele noted on exam.   Several large, non thrombosed external hemorrhoids on exam. No bleeding noted.   Skin: Skin is warm and dry.          Assessment & Plan:

## 2018-02-15 NOTE — Assessment & Plan Note (Signed)
Compliant to omeprazole, cough doesn't seem to be GERD related.

## 2018-02-18 ENCOUNTER — Encounter (INDEPENDENT_AMBULATORY_CARE_PROVIDER_SITE_OTHER): Payer: Medicare Other | Admitting: Ophthalmology

## 2018-02-18 LAB — PTH, INTACT AND CALCIUM
CALCIUM: 9.4 mg/dL (ref 8.6–10.4)
PTH: 59 pg/mL (ref 14–64)

## 2018-02-19 DIAGNOSIS — I129 Hypertensive chronic kidney disease with stage 1 through stage 4 chronic kidney disease, or unspecified chronic kidney disease: Secondary | ICD-10-CM | POA: Diagnosis not present

## 2018-02-19 DIAGNOSIS — R296 Repeated falls: Secondary | ICD-10-CM | POA: Diagnosis not present

## 2018-02-19 DIAGNOSIS — R2689 Other abnormalities of gait and mobility: Secondary | ICD-10-CM | POA: Diagnosis not present

## 2018-02-19 DIAGNOSIS — N183 Chronic kidney disease, stage 3 (moderate): Secondary | ICD-10-CM | POA: Diagnosis not present

## 2018-02-19 DIAGNOSIS — D631 Anemia in chronic kidney disease: Secondary | ICD-10-CM | POA: Diagnosis not present

## 2018-02-19 DIAGNOSIS — G894 Chronic pain syndrome: Secondary | ICD-10-CM | POA: Diagnosis not present

## 2018-02-21 DIAGNOSIS — N183 Chronic kidney disease, stage 3 (moderate): Secondary | ICD-10-CM | POA: Diagnosis not present

## 2018-02-21 DIAGNOSIS — R2689 Other abnormalities of gait and mobility: Secondary | ICD-10-CM | POA: Diagnosis not present

## 2018-02-21 DIAGNOSIS — R296 Repeated falls: Secondary | ICD-10-CM | POA: Diagnosis not present

## 2018-02-21 DIAGNOSIS — D631 Anemia in chronic kidney disease: Secondary | ICD-10-CM | POA: Diagnosis not present

## 2018-02-21 DIAGNOSIS — G894 Chronic pain syndrome: Secondary | ICD-10-CM | POA: Diagnosis not present

## 2018-02-21 DIAGNOSIS — I129 Hypertensive chronic kidney disease with stage 1 through stage 4 chronic kidney disease, or unspecified chronic kidney disease: Secondary | ICD-10-CM | POA: Diagnosis not present

## 2018-02-27 DIAGNOSIS — R296 Repeated falls: Secondary | ICD-10-CM | POA: Diagnosis not present

## 2018-02-27 DIAGNOSIS — R2689 Other abnormalities of gait and mobility: Secondary | ICD-10-CM | POA: Diagnosis not present

## 2018-02-27 DIAGNOSIS — I129 Hypertensive chronic kidney disease with stage 1 through stage 4 chronic kidney disease, or unspecified chronic kidney disease: Secondary | ICD-10-CM | POA: Diagnosis not present

## 2018-02-27 DIAGNOSIS — N183 Chronic kidney disease, stage 3 (moderate): Secondary | ICD-10-CM | POA: Diagnosis not present

## 2018-02-27 DIAGNOSIS — D631 Anemia in chronic kidney disease: Secondary | ICD-10-CM | POA: Diagnosis not present

## 2018-02-27 DIAGNOSIS — G894 Chronic pain syndrome: Secondary | ICD-10-CM | POA: Diagnosis not present

## 2018-02-28 DIAGNOSIS — I129 Hypertensive chronic kidney disease with stage 1 through stage 4 chronic kidney disease, or unspecified chronic kidney disease: Secondary | ICD-10-CM | POA: Diagnosis not present

## 2018-02-28 DIAGNOSIS — D631 Anemia in chronic kidney disease: Secondary | ICD-10-CM | POA: Diagnosis not present

## 2018-02-28 DIAGNOSIS — N2581 Secondary hyperparathyroidism of renal origin: Secondary | ICD-10-CM | POA: Diagnosis not present

## 2018-02-28 DIAGNOSIS — N183 Chronic kidney disease, stage 3 (moderate): Secondary | ICD-10-CM | POA: Diagnosis not present

## 2018-03-04 DIAGNOSIS — N183 Chronic kidney disease, stage 3 (moderate): Secondary | ICD-10-CM | POA: Diagnosis not present

## 2018-03-04 DIAGNOSIS — R296 Repeated falls: Secondary | ICD-10-CM | POA: Diagnosis not present

## 2018-03-04 DIAGNOSIS — I129 Hypertensive chronic kidney disease with stage 1 through stage 4 chronic kidney disease, or unspecified chronic kidney disease: Secondary | ICD-10-CM | POA: Diagnosis not present

## 2018-03-04 DIAGNOSIS — R2689 Other abnormalities of gait and mobility: Secondary | ICD-10-CM | POA: Diagnosis not present

## 2018-03-04 DIAGNOSIS — G894 Chronic pain syndrome: Secondary | ICD-10-CM | POA: Diagnosis not present

## 2018-03-04 DIAGNOSIS — D631 Anemia in chronic kidney disease: Secondary | ICD-10-CM | POA: Diagnosis not present

## 2018-03-11 DIAGNOSIS — I129 Hypertensive chronic kidney disease with stage 1 through stage 4 chronic kidney disease, or unspecified chronic kidney disease: Secondary | ICD-10-CM | POA: Diagnosis not present

## 2018-03-11 DIAGNOSIS — R296 Repeated falls: Secondary | ICD-10-CM | POA: Diagnosis not present

## 2018-03-11 DIAGNOSIS — G894 Chronic pain syndrome: Secondary | ICD-10-CM | POA: Diagnosis not present

## 2018-03-11 DIAGNOSIS — N183 Chronic kidney disease, stage 3 (moderate): Secondary | ICD-10-CM | POA: Diagnosis not present

## 2018-03-11 DIAGNOSIS — R2689 Other abnormalities of gait and mobility: Secondary | ICD-10-CM | POA: Diagnosis not present

## 2018-03-11 DIAGNOSIS — D631 Anemia in chronic kidney disease: Secondary | ICD-10-CM | POA: Diagnosis not present

## 2018-03-12 ENCOUNTER — Other Ambulatory Visit: Payer: Self-pay | Admitting: Primary Care

## 2018-03-12 DIAGNOSIS — F419 Anxiety disorder, unspecified: Principal | ICD-10-CM

## 2018-03-12 DIAGNOSIS — K581 Irritable bowel syndrome with constipation: Secondary | ICD-10-CM

## 2018-03-12 DIAGNOSIS — F329 Major depressive disorder, single episode, unspecified: Secondary | ICD-10-CM

## 2018-03-12 NOTE — Telephone Encounter (Signed)
Ok to refill? Electronically refill request for sertraline (ZOLOFT) 25 MG tablet  Last prescribed 02/15/2018. Last seen on 02/15/2018

## 2018-03-13 ENCOUNTER — Encounter: Payer: Self-pay | Admitting: Primary Care

## 2018-03-13 NOTE — Telephone Encounter (Signed)
Spoken to patient's niece. She stated patient is doing good on the Zoloft 25 mg, much improve. She asked if can refill for 90 days supply.  Also asked if Anda Kraft can refill promethazine for patient as well. Last prescribed was back in November of 2018.

## 2018-03-13 NOTE — Telephone Encounter (Signed)
How's patient doing on Zoloft 25 mg? She was supposed to schedule a follow up visit but did not. Any improvement? Any problems?

## 2018-03-13 NOTE — Telephone Encounter (Signed)
Message left for patient's niece (on [place]) to return my call.

## 2018-03-14 ENCOUNTER — Encounter: Payer: Self-pay | Admitting: Primary Care

## 2018-03-14 ENCOUNTER — Other Ambulatory Visit: Payer: Self-pay | Admitting: Primary Care

## 2018-03-14 DIAGNOSIS — K5909 Other constipation: Secondary | ICD-10-CM

## 2018-03-14 DIAGNOSIS — R32 Unspecified urinary incontinence: Secondary | ICD-10-CM

## 2018-03-14 DIAGNOSIS — G629 Polyneuropathy, unspecified: Secondary | ICD-10-CM

## 2018-03-14 DIAGNOSIS — K581 Irritable bowel syndrome with constipation: Secondary | ICD-10-CM

## 2018-03-14 DIAGNOSIS — I10 Essential (primary) hypertension: Secondary | ICD-10-CM

## 2018-03-14 MED ORDER — SOLIFENACIN SUCCINATE 5 MG PO TABS: 5.0000 mg | ORAL_TABLET | Freq: Every day | ORAL | 1 refills | Status: DC

## 2018-03-14 MED ORDER — PROMETHAZINE HCL 12.5 MG PO TABS
12.5000 mg | ORAL_TABLET | Freq: Two times a day (BID) | ORAL | 0 refills | Status: DC | PRN
Start: 2018-03-14 — End: 2018-06-10

## 2018-03-14 NOTE — Telephone Encounter (Signed)
Noted refills for both medications sent to pharmacy.

## 2018-03-14 NOTE — Telephone Encounter (Signed)
Already reply to this in the telephone encounter.

## 2018-03-20 ENCOUNTER — Encounter (INDEPENDENT_AMBULATORY_CARE_PROVIDER_SITE_OTHER): Payer: Medicare Other | Admitting: Ophthalmology

## 2018-03-20 DIAGNOSIS — H43813 Vitreous degeneration, bilateral: Secondary | ICD-10-CM

## 2018-03-20 DIAGNOSIS — H35372 Puckering of macula, left eye: Secondary | ICD-10-CM | POA: Diagnosis not present

## 2018-03-20 DIAGNOSIS — H353132 Nonexudative age-related macular degeneration, bilateral, intermediate dry stage: Secondary | ICD-10-CM

## 2018-03-20 DIAGNOSIS — H35033 Hypertensive retinopathy, bilateral: Secondary | ICD-10-CM | POA: Diagnosis not present

## 2018-03-20 DIAGNOSIS — I10 Essential (primary) hypertension: Secondary | ICD-10-CM

## 2018-04-03 ENCOUNTER — Ambulatory Visit: Payer: Private Health Insurance - Indemnity | Admitting: Primary Care

## 2018-04-15 ENCOUNTER — Other Ambulatory Visit: Payer: Self-pay | Admitting: Primary Care

## 2018-04-15 DIAGNOSIS — G4700 Insomnia, unspecified: Secondary | ICD-10-CM

## 2018-06-10 ENCOUNTER — Other Ambulatory Visit: Payer: Self-pay | Admitting: Primary Care

## 2018-06-10 DIAGNOSIS — K581 Irritable bowel syndrome with constipation: Secondary | ICD-10-CM

## 2018-06-28 ENCOUNTER — Encounter: Payer: Self-pay | Admitting: Family Medicine

## 2018-06-28 ENCOUNTER — Ambulatory Visit (INDEPENDENT_AMBULATORY_CARE_PROVIDER_SITE_OTHER): Payer: Medicare Other | Admitting: Family Medicine

## 2018-06-28 VITALS — BP 150/80 | HR 75 | Temp 98.7°F | Ht 61.0 in | Wt 149.8 lb

## 2018-06-28 DIAGNOSIS — K649 Unspecified hemorrhoids: Secondary | ICD-10-CM | POA: Diagnosis not present

## 2018-06-28 DIAGNOSIS — R300 Dysuria: Secondary | ICD-10-CM | POA: Diagnosis not present

## 2018-06-28 LAB — POC URINALSYSI DIPSTICK (AUTOMATED)
BILIRUBIN UA: NEGATIVE
GLUCOSE UA: NEGATIVE
KETONES UA: NEGATIVE
Nitrite, UA: NEGATIVE
Protein, UA: POSITIVE — AB
Spec Grav, UA: 1.02 (ref 1.010–1.025)
Urobilinogen, UA: 0.2 E.U./dL
pH, UA: 6 (ref 5.0–8.0)

## 2018-06-28 MED ORDER — HYDROCORTISONE ACETATE 25 MG RE SUPP: 25.0000 mg | Freq: Two times a day (BID) | RECTAL | 0 refills | Status: AC

## 2018-06-28 MED ORDER — SULFAMETHOXAZOLE-TRIMETHOPRIM 400-80 MG PO TABS: 1.0000 | ORAL_TABLET | Freq: Two times a day (BID) | ORAL | 0 refills | Status: DC

## 2018-06-28 MED ORDER — LIDOCAINE 5 % EX OINT: 1.0000 "application " | TOPICAL_OINTMENT | Freq: Three times a day (TID) | CUTANEOUS | 0 refills | Status: DC | PRN

## 2018-06-28 NOTE — Progress Notes (Signed)
Rectal pain with BM- longstanding.  On amitiza for h/o constipation.   Sx with urination-this is separate from the above.  Urethral pain with urination.  No abd pain.  No fevers.  Meds, vitals, and allergies reviewed.   Per HPI unless specifically indicated in ROS section   GEN: nad, alert and oriented HEENT: mucous membranes moist NECK: supple CV: rrr.  PULM: ctab, no inc wob ABD: soft, +bs, suprapubic area tender EXT: no edema SKIN: Well-perfused BACK: no CVA pain

## 2018-06-28 NOTE — Patient Instructions (Signed)
Drink plenty of water and start the antibiotics today.  We'll contact you with your lab report.  Use lidocaine for the rectal pain as needed.  Take care.

## 2018-06-29 LAB — URINE CULTURE
MICRO NUMBER:: 91009670
SPECIMEN QUALITY:: ADEQUATE

## 2018-06-30 NOTE — Assessment & Plan Note (Signed)
Rectal pain is likely separate from dysuria.  Discussed with patient about options.  She can use hydrocortisone suppositories, she can use lidocaine ointment as needed.  Continue bowel regimen.

## 2018-06-30 NOTE — Assessment & Plan Note (Signed)
Likely a separate issue from the rectal pain.  Presumed cystitis.  Start Septra.  Urinalysis discussed.  Check urine culture.  See notes on labs.  Okay for outpatient follow-up.  All questions answered.

## 2018-07-10 ENCOUNTER — Ambulatory Visit: Payer: Private Health Insurance - Indemnity | Admitting: Primary Care

## 2018-08-21 ENCOUNTER — Ambulatory Visit (INDEPENDENT_AMBULATORY_CARE_PROVIDER_SITE_OTHER): Payer: Medicare Other | Admitting: Primary Care

## 2018-08-21 ENCOUNTER — Other Ambulatory Visit: Payer: Self-pay | Admitting: Primary Care

## 2018-08-21 ENCOUNTER — Encounter: Payer: Self-pay | Admitting: Primary Care

## 2018-08-21 VITALS — BP 130/78 | HR 76 | Temp 97.7°F | Ht 61.0 in | Wt 157.5 lb

## 2018-08-21 DIAGNOSIS — R300 Dysuria: Secondary | ICD-10-CM | POA: Diagnosis not present

## 2018-08-21 DIAGNOSIS — F0150 Vascular dementia without behavioral disturbance: Secondary | ICD-10-CM | POA: Diagnosis not present

## 2018-08-21 DIAGNOSIS — E039 Hypothyroidism, unspecified: Secondary | ICD-10-CM

## 2018-08-21 DIAGNOSIS — K581 Irritable bowel syndrome with constipation: Secondary | ICD-10-CM | POA: Diagnosis not present

## 2018-08-21 DIAGNOSIS — E785 Hyperlipidemia, unspecified: Secondary | ICD-10-CM

## 2018-08-21 DIAGNOSIS — R32 Unspecified urinary incontinence: Secondary | ICD-10-CM

## 2018-08-21 DIAGNOSIS — N183 Chronic kidney disease, stage 3 unspecified: Secondary | ICD-10-CM

## 2018-08-21 DIAGNOSIS — G629 Polyneuropathy, unspecified: Secondary | ICD-10-CM

## 2018-08-21 DIAGNOSIS — K5909 Other constipation: Secondary | ICD-10-CM

## 2018-08-21 DIAGNOSIS — I10 Essential (primary) hypertension: Secondary | ICD-10-CM

## 2018-08-21 DIAGNOSIS — F419 Anxiety disorder, unspecified: Secondary | ICD-10-CM

## 2018-08-21 DIAGNOSIS — G4700 Insomnia, unspecified: Secondary | ICD-10-CM

## 2018-08-21 DIAGNOSIS — Z23 Encounter for immunization: Secondary | ICD-10-CM | POA: Diagnosis not present

## 2018-08-21 DIAGNOSIS — F329 Major depressive disorder, single episode, unspecified: Secondary | ICD-10-CM | POA: Diagnosis not present

## 2018-08-21 DIAGNOSIS — K219 Gastro-esophageal reflux disease without esophagitis: Secondary | ICD-10-CM | POA: Diagnosis not present

## 2018-08-21 DIAGNOSIS — F0153 Vascular dementia, unspecified severity, with mood disturbance: Secondary | ICD-10-CM

## 2018-08-21 LAB — BASIC METABOLIC PANEL
BUN: 21 mg/dL (ref 6–23)
CHLORIDE: 105 meq/L (ref 96–112)
CO2: 27 mEq/L (ref 19–32)
Calcium: 9.4 mg/dL (ref 8.4–10.5)
Creatinine, Ser: 1.11 mg/dL (ref 0.40–1.20)
GFR: 49.3 mL/min — AB (ref >60.00)
Glucose, Bld: 108 mg/dL — ABNORMAL HIGH (ref 70–99)
POTASSIUM: 4.7 meq/L (ref 3.5–5.1)
SODIUM: 140 meq/L (ref 135–145)

## 2018-08-21 LAB — POC URINALSYSI DIPSTICK (AUTOMATED)
BILIRUBIN UA: NEGATIVE
GLUCOSE UA: NEGATIVE
KETONES UA: NEGATIVE
NITRITE UA: NEGATIVE
PH UA: 6 (ref 5.0–8.0)
Protein, UA: NEGATIVE
Spec Grav, UA: 1.015 (ref 1.010–1.025)
Urobilinogen, UA: 0.2 E.U./dL

## 2018-08-21 LAB — LIPID PANEL
CHOL/HDL RATIO: 6
CHOLESTEROL: 231 mg/dL — AB (ref 0–200)
HDL: 41.2 mg/dL (ref >39.00)

## 2018-08-21 LAB — TSH: TSH: 2.33 u[IU]/mL (ref 0.35–4.50)

## 2018-08-21 LAB — LDL CHOLESTEROL, DIRECT: Direct LDL: 93 mg/dL

## 2018-08-21 MED ORDER — PROMETHAZINE HCL 12.5 MG PO TABS: 12.5000 mg | ORAL_TABLET | Freq: Two times a day (BID) | ORAL | 0 refills | Status: DC | PRN

## 2018-08-21 MED ORDER — SULFAMETHOXAZOLE-TRIMETHOPRIM 800-160 MG PO TABS: 1.0000 | ORAL_TABLET | Freq: Two times a day (BID) | ORAL | 0 refills | Status: DC

## 2018-08-21 MED ORDER — SERTRALINE HCL 50 MG PO TABS: ORAL_TABLET | ORAL | 0 refills | Status: DC

## 2018-08-21 NOTE — Assessment & Plan Note (Signed)
Stable in the office today, continue current regimen. BMP pending.

## 2018-08-21 NOTE — Assessment & Plan Note (Signed)
Doing well on Amitiza. Using promethazine several times weekly, refill sent to pharmacy. Doing well with rectal ointment, continue same.

## 2018-08-21 NOTE — Assessment & Plan Note (Signed)
Suspect anxiety to be playing a role. Increase Zoloft to 50 mg. Her niece will update in 4 weeks.

## 2018-08-21 NOTE — Assessment & Plan Note (Signed)
Repeat TSH pending. Continue levothyroxine 25 mcg for now.

## 2018-08-21 NOTE — Assessment & Plan Note (Signed)
Overall doing well on Zoloft, does continue to worry and experience mind racing thoughts at night. Will start with a dose increase to 50 mg of Zoloft. Also switch to bedtime.  She and her niece will update.

## 2018-08-21 NOTE — Assessment & Plan Note (Signed)
Doing well on omeprazole, continue same.

## 2018-08-21 NOTE — Assessment & Plan Note (Signed)
Doing well on gabapentin HS, continue same.  

## 2018-08-21 NOTE — Patient Instructions (Signed)
Stop by the lab prior to leaving today. I will notify you of your results once received.   We've increased the dose of your sertraline (Zoloft) to 50 mg. Take 1 tablet at bedtime. Please update me via My Chart in 4 weeks.   Start Bactrim DS (sulfamethoxazole/trimethoprim) tablets for urinary tract infection. Take 1 tablet by mouth twice daily for 5 days.  Please schedule a follow up appointment in 6 months.   It was a pleasure to see you today!

## 2018-08-21 NOTE — Addendum Note (Signed)
Addended by: Jacqualin Combes on: 08/21/2018 01:54 PM   Modules accepted: Orders

## 2018-08-21 NOTE — Assessment & Plan Note (Signed)
No improvement since last visit, no worse.  UA today with 3+ leuks, trace blood, negative nitrites. Culture sent.  Treat with Bactrim DS tablets.  Discussed good perineal care given use of pads for incontinence.   If UTI's persist then consider treatment for vulvar atrophy vs Urology evaluation.

## 2018-08-21 NOTE — Assessment & Plan Note (Signed)
Doing well with Amitiza, continue same.

## 2018-08-21 NOTE — Assessment & Plan Note (Signed)
Repeat lipids pending.  

## 2018-08-21 NOTE — Assessment & Plan Note (Signed)
Following with nephrology, no changes made on recent visit.

## 2018-08-21 NOTE — Progress Notes (Signed)
Subjective:    Patient ID: Brittany Hughes, female    DOB: 21-Apr-1928, 84 y.o.   MRN: [id number]  HPI  Brittany Hughes is an 84 year old female who presents today for follow up.  1) Insomnia/Anxiety/Depression: Currently managed on sertraline 25 mg. Her main concern is insomnia, difficulty falling asleep and waking during the night. She has mind racing thoughts and will lay awake for hours. She was once managed on Amitriptyline for years, was taken off by prior PCP and switched to Trazodone which didn't provide assistance. She was re-initiated on Amitriptyline which didn't provide any improvement. She's taking Melatonin nightly for the most part, this helps some but she continues to lay awake at night.  2) Essential Hypertension: Currently managed on metoprolol succinate 50 mg, lisinopril 5 mg. She denies chest pain, dizziness, shortness of breath.   BP Readings from Last 3 Encounters:  08/21/18 130/78  06/28/18 (!) 150/80  02/15/18 140/68   3) Hypothyroidism: Currently managed on levothyroxine 25 mcg. Her last TSH was 2.52 in November 2018.   4) IBS/Chronic Constipation: Currently managed on Amitiza 24 mcg daily and promethazine 12.5 mg PRN nausea for which she takes several times weekly.on average. She tries not to take daily, has been taking promethazine for years. Recently evaluated for rectal pain. She was provided with a prescription for topical lidocaine ointment which has helped significantly with rectal pain. She is now using an OTC product that is similar and is providing relief.   5) Urinary Incontinence/CKD/Dysuria: Currently managed on Vesicare 5 mg daily. Doing very well on Vesicare and is following with nephropathy. She was seen in late August 2019 with complaints of urethral pain with urination. UA and culture negative, she was treated with Bactrim DS tablets.   Today she reports dysuria for which has been present since late August 2019. She's not sure if she felt better after taking  Bactrim DS tablets that were provided during her visit in August. She denies hematuria, flank pain, fevers.  6) Neuropathy: Currently managed on gabapentin 300 mg HS. Overall feels well managed. Denies drowsiness.   Review of Systems  Respiratory: Negative for shortness of breath.   Cardiovascular: Negative for chest pain.  Genitourinary: Positive for dysuria. Negative for flank pain, pelvic pain and vaginal discharge.       Intermittent urinary incontinence   Neurological:       Neuropathy   Psychiatric/Behavioral: Positive for sleep disturbance. The patient is nervous/anxious.        Past Medical History:  Diagnosis Date  . Atrophy of vulva   . Carotid stenosis   . Chronic kidney disease   . Depression   . Dermatophytosis of nail   . DVT (deep venous thrombosis) ([place]) 01/06/2014   RLE  . Dysuria   . GERD (gastroesophageal reflux disease)   . Hyperglycemia 04/18/2016  . Hyperlipidemia   . Hyperpotassemia   . Hypertension   . Macular degeneration    "left eye; growth behind that" (01/06/2014)  . Mild cognitive impairment, so stated   . Nephrolithiasis   . Osteoporosis, unspecified   . Other B-complex deficiencies   . Other diseases of larynx   . Other voice and resonance disorders   . Reflux esophagitis   . Seizures ([place])    "one, years ago" (01/06/2014)  . Unspecified hereditary and idiopathic peripheral neuropathy   . Unspecified malignant neoplasm of skin, site unspecified    left foot  . Unspecified urinary incontinence   . Vitamin  deficiency      Social History   Socioeconomic History  . Marital status: Widowed    Spouse name: Not on file  . Number of children: Not on file  . Years of education: Not on file  . Highest education level: Not on file  Occupational History  . Not on file  Social Needs  . Financial resource strain: Not on file  . Food insecurity:    Worry: Not on file    Inability: Not on file  . Transportation needs:    Medical: Not on file     Non-medical: Not on file  Tobacco Use  . Smoking status: Former Smoker    Packs/day: 0.50    Years: 60.00    Pack years: 30.00    Types: Cigarettes    Last attempt to quit: 10/31/2011    Years since quitting: 6.8  . Smokeless tobacco: Never Used  Substance and Sexual Activity  . Alcohol use: No  . Drug use: No  . Sexual activity: Never  Lifestyle  . Physical activity:    Days per week: Not on file    Minutes per session: Not on file  . Stress: Not on file  Relationships  . Social connections:    Talks on phone: Not on file    Gets together: Not on file    Attends religious service: Not on file    Active member of club or organization: Not on file    Attends meetings of clubs or organizations: Not on file    Relationship status: Not on file  . Intimate partner violence:    Fear of current or ex partner: Not on file    Emotionally abused: Not on file    Physically abused: Not on file    Forced sexual activity: Not on file  Other Topics Concern  . Not on file  Social History Narrative  . Not on file    Past Surgical History:  Procedure Laterality Date  . CAROTID ENDARTERECTOMY Right 2006  . CATARACT EXTRACTION W/ INTRAOCULAR LENS  IMPLANT, BILATERAL Bilateral 1990's  . DILATION AND CURETTAGE OF UTERUS    . EXCISIONAL HEMORRHOIDECTOMY  1970's?  Marland Kitchen STOMACH SURGERY  ~ 1960   "had ulcers for years; left scar tissue; did OR" (01/06/2014)  . VAGINAL HYSTERECTOMY  ~ 1980    Family History  Problem Relation Age of Onset  . Heart disease Mother 24  . Kidney disease Father   . Diabetes Sister   . Heart disease Brother 33  . Heart attack Son 60  . Cancer Son        Throat  . Stroke Brother   . Cancer Brother   . Cancer Sister        Kidney    Allergies  Allergen Reactions  . Codeine Other (See Comments) and Nausea And Vomiting    nausea  . Colace [Docusate Calcium] Nausea Only    Current Outpatient Medications on File Prior to Visit  Medication Sig Dispense  Refill  . AMITIZA 24 MCG capsule TAKE 1 CAPSULE (24 MCG TOTAL) BY MOUTH 2 (TWO) TIMES DAILY WITH A MEAL. 180 capsule 1  . antiseptic oral rinse (BIOTENE) LIQD 1 application by Mouth Rinse route every 4 (four) hours as needed for dry mouth.    . cetirizine (ZYRTEC) 10 MG tablet Take 1 tablet by mouth at bedtime for allergies. 90 tablet 1  . cholecalciferol (VITAMIN D) 1000 units tablet Take 1,000 Units by mouth daily.    Marland Kitchen  docusate sodium (COLACE) 100 MG capsule Take 100 mg by mouth daily.    Marland Kitchen gabapentin (NEURONTIN) 300 MG capsule TAKE 1 CAPSULE BY MOUTH EVERYDAY AT BEDTIME 90 capsule 1  . levothyroxine (SYNTHROID, LEVOTHROID) 25 MCG tablet Take 1 tablet by mouth every morning on an empty stomach with a full glass of water. 90 tablet 3  . lidocaine (XYLOCAINE) 5 % ointment Apply 1 application topically 3 (three) times daily as needed. For rectal pain. 35.44 g 0  . lisinopril (PRINIVIL,ZESTRIL) 5 MG tablet TAKE 1 TABLET BY MOUTH EVERY DAY 90 tablet 1  . Melatonin 3 MG TABS Take 1 tablet (3 mg total) by mouth daily. 30 tablet 0  . metoprolol succinate (TOPROL-XL) 50 MG 24 hr tablet TAKE 1 TABLET BY MOUTH EVERY DAY 90 tablet 1  . omeprazole (PRILOSEC) 20 MG capsule TAKE 1 CAPSULE (20 MG TOTAL) BY MOUTH DAILY. FOR ACID REFLUX 90 capsule 1  . Polyethyl Glycol-Propyl Glycol (SYSTANE) 0.4-0.3 % SOLN Apply 1 drop to eye daily as needed (for dry eyes).    . solifenacin (VESICARE) 5 MG tablet Take 1 tablet (5 mg total) by mouth daily. 90 tablet 1  . vitamin B-12 (CYANOCOBALAMIN) 1000 MCG tablet Take 1,000 mcg by mouth daily.    . hydrocortisone (ANUSOL-HC) 25 MG suppository Place 1 suppository (25 mg total) rectally 2 (two) times daily. (Patient not taking: Reported on 08/21/2018) 12 suppository 0   No current facility-administered medications on file prior to visit.     BP 130/78   Pulse 76   Temp 97.7 F (36.5 C) (Oral)   Ht 5\' 1"  (1.549 m)   Wt 157 lb 8 oz (71.4 kg)   SpO2 97%   BMI 29.76 kg/m      Objective:   Physical Exam  Constitutional: She is oriented to person, place, and time. She appears well-nourished.  Neck: Neck supple.  Cardiovascular: Normal rate and regular rhythm.  Respiratory: Effort normal and breath sounds normal.  Neurological: She is alert and oriented to person, place, and time.  Skin: Skin is warm and dry.  Psychiatric: She has a normal mood and affect.           Assessment & Plan:

## 2018-08-21 NOTE — Assessment & Plan Note (Signed)
Overall doing well from depression standpoint but does have persistent anxiety at bedtime. Increase Zoloft to 50 mg and switch to night time use. Her niece will update [place] in 4 weeks. Denies SI/HI.

## 2018-08-21 NOTE — Assessment & Plan Note (Signed)
Doing well on Vesicare, continue same.

## 2018-08-22 LAB — URINE CULTURE
MICRO NUMBER:: 91244243
SPECIMEN QUALITY: ADEQUATE

## 2018-09-02 ENCOUNTER — Other Ambulatory Visit: Payer: Self-pay | Admitting: Primary Care

## 2018-09-02 DIAGNOSIS — K5909 Other constipation: Secondary | ICD-10-CM

## 2018-09-02 DIAGNOSIS — G629 Polyneuropathy, unspecified: Secondary | ICD-10-CM

## 2018-09-02 DIAGNOSIS — I10 Essential (primary) hypertension: Secondary | ICD-10-CM

## 2018-09-06 ENCOUNTER — Other Ambulatory Visit: Payer: Self-pay | Admitting: Primary Care

## 2018-09-06 DIAGNOSIS — R32 Unspecified urinary incontinence: Secondary | ICD-10-CM

## 2018-09-06 NOTE — Telephone Encounter (Signed)
Last prescribed on 10/23/2017 Last office visit on 08/21/2018

## 2018-09-06 NOTE — Telephone Encounter (Signed)
Noted, Rx sent to pharmacy. 

## 2018-11-16 ENCOUNTER — Other Ambulatory Visit: Payer: Self-pay | Admitting: Primary Care

## 2018-11-16 DIAGNOSIS — F329 Major depressive disorder, single episode, unspecified: Principal | ICD-10-CM

## 2018-11-16 DIAGNOSIS — G4700 Insomnia, unspecified: Secondary | ICD-10-CM

## 2018-11-16 DIAGNOSIS — F0150 Vascular dementia without behavioral disturbance: Secondary | ICD-10-CM

## 2018-11-16 DIAGNOSIS — F0153 Vascular dementia, unspecified severity, with mood disturbance: Secondary | ICD-10-CM

## 2018-11-18 ENCOUNTER — Other Ambulatory Visit: Payer: Self-pay | Admitting: Primary Care

## 2018-11-18 DIAGNOSIS — K581 Irritable bowel syndrome with constipation: Secondary | ICD-10-CM

## 2018-11-18 NOTE — Telephone Encounter (Signed)
Last prescribed on 08/21/2018 #90  with 0 refills. Last office visit on 08/21/2018. No future appointment

## 2018-11-19 NOTE — Telephone Encounter (Signed)
How she doing on the increased dose of Zoloft to 50 mg?  We increased her dose back in October 2019.

## 2018-11-20 NOTE — Telephone Encounter (Signed)
Pt's [name] stated pt is doing great on the 50mg .

## 2018-11-20 NOTE — Telephone Encounter (Signed)
Left message on VM per DPR asking pt how she is doing on the 50mg . Asked her to call and update.

## 2018-11-22 NOTE — Telephone Encounter (Signed)
Noted, refill sent to pharmacy. 

## 2018-12-30 ENCOUNTER — Other Ambulatory Visit: Payer: Self-pay | Admitting: Primary Care

## 2018-12-30 DIAGNOSIS — I10 Essential (primary) hypertension: Secondary | ICD-10-CM

## 2018-12-31 DIAGNOSIS — K219 Gastro-esophageal reflux disease without esophagitis: Secondary | ICD-10-CM

## 2018-12-31 DIAGNOSIS — E039 Hypothyroidism, unspecified: Secondary | ICD-10-CM

## 2018-12-31 MED ORDER — OMEPRAZOLE 20 MG PO CPDR: DELAYED_RELEASE_CAPSULE | ORAL | 3 refills | Status: AC

## 2018-12-31 MED ORDER — LEVOTHYROXINE SODIUM 25 MCG PO TABS: ORAL_TABLET | ORAL | 1 refills | Status: DC

## 2019-03-25 ENCOUNTER — Other Ambulatory Visit: Payer: Self-pay | Admitting: Primary Care

## 2019-03-25 DIAGNOSIS — K5909 Other constipation: Secondary | ICD-10-CM

## 2019-03-25 DIAGNOSIS — G629 Polyneuropathy, unspecified: Secondary | ICD-10-CM

## 2019-03-25 DIAGNOSIS — I10 Essential (primary) hypertension: Secondary | ICD-10-CM

## 2019-03-25 DIAGNOSIS — K581 Irritable bowel syndrome with constipation: Secondary | ICD-10-CM

## 2019-03-26 ENCOUNTER — Other Ambulatory Visit: Payer: Self-pay

## 2019-03-26 DIAGNOSIS — K581 Irritable bowel syndrome with constipation: Secondary | ICD-10-CM

## 2019-03-26 NOTE — Telephone Encounter (Signed)
Last prescribed on 11/19/2018  . Last office visit on 08/21/2018. No future appointment

## 2019-03-26 NOTE — Telephone Encounter (Signed)
Noted, refill sent to pharmacy. 

## 2019-03-27 ENCOUNTER — Telehealth: Payer: Self-pay | Admitting: *Deleted

## 2019-03-27 NOTE — Telephone Encounter (Signed)
Received fax from [facility] requesting PA for Promethazine 12.5 mg.  PA completed via phone and approved.  [facility] notified of approval via fax.

## 2019-04-02 ENCOUNTER — Encounter (INDEPENDENT_AMBULATORY_CARE_PROVIDER_SITE_OTHER): Payer: Medicare Other | Admitting: Ophthalmology

## 2019-04-18 DIAGNOSIS — N183 Chronic kidney disease, stage 3 (moderate): Secondary | ICD-10-CM | POA: Diagnosis not present

## 2019-04-23 DIAGNOSIS — N183 Chronic kidney disease, stage 3 (moderate): Secondary | ICD-10-CM | POA: Diagnosis not present

## 2019-04-23 DIAGNOSIS — N2581 Secondary hyperparathyroidism of renal origin: Secondary | ICD-10-CM | POA: Diagnosis not present

## 2019-04-23 DIAGNOSIS — D631 Anemia in chronic kidney disease: Secondary | ICD-10-CM | POA: Diagnosis not present

## 2019-04-23 DIAGNOSIS — I129 Hypertensive chronic kidney disease with stage 1 through stage 4 chronic kidney disease, or unspecified chronic kidney disease: Secondary | ICD-10-CM | POA: Diagnosis not present

## 2019-06-23 ENCOUNTER — Other Ambulatory Visit: Payer: Self-pay | Admitting: Primary Care

## 2019-06-23 DIAGNOSIS — K581 Irritable bowel syndrome with constipation: Secondary | ICD-10-CM

## 2019-06-23 DIAGNOSIS — E039 Hypothyroidism, unspecified: Secondary | ICD-10-CM

## 2019-06-23 DIAGNOSIS — I10 Essential (primary) hypertension: Secondary | ICD-10-CM

## 2019-06-25 NOTE — Telephone Encounter (Signed)
I have sent the refill request to you

## 2019-06-25 NOTE — Telephone Encounter (Signed)
tLast prescribed on 03/26/2019. Last appointment on 08/21/2018. N future appointment

## 2019-06-25 NOTE — Telephone Encounter (Signed)
Noted.  Will notify patient's family that she is due for follow-up visit for further refills.  Refill sent to pharmacy.

## 2019-07-23 ENCOUNTER — Ambulatory Visit (INDEPENDENT_AMBULATORY_CARE_PROVIDER_SITE_OTHER): Payer: Medicare Other | Admitting: Primary Care

## 2019-07-23 ENCOUNTER — Encounter: Payer: Self-pay | Admitting: Primary Care

## 2019-07-23 ENCOUNTER — Other Ambulatory Visit: Payer: Self-pay

## 2019-07-23 VITALS — BP 128/76 | HR 82 | Temp 98.0°F | Ht 61.0 in | Wt 153.5 lb

## 2019-07-23 DIAGNOSIS — N183 Chronic kidney disease, stage 3 unspecified: Secondary | ICD-10-CM

## 2019-07-23 DIAGNOSIS — K581 Irritable bowel syndrome with constipation: Secondary | ICD-10-CM | POA: Diagnosis not present

## 2019-07-23 DIAGNOSIS — E785 Hyperlipidemia, unspecified: Secondary | ICD-10-CM

## 2019-07-23 DIAGNOSIS — F0150 Vascular dementia without behavioral disturbance: Secondary | ICD-10-CM

## 2019-07-23 DIAGNOSIS — R739 Hyperglycemia, unspecified: Secondary | ICD-10-CM

## 2019-07-23 DIAGNOSIS — I10 Essential (primary) hypertension: Secondary | ICD-10-CM

## 2019-07-23 DIAGNOSIS — F0153 Vascular dementia, unspecified severity, with mood disturbance: Secondary | ICD-10-CM

## 2019-07-23 DIAGNOSIS — D649 Anemia, unspecified: Secondary | ICD-10-CM

## 2019-07-23 DIAGNOSIS — K5909 Other constipation: Secondary | ICD-10-CM | POA: Diagnosis not present

## 2019-07-23 DIAGNOSIS — E039 Hypothyroidism, unspecified: Secondary | ICD-10-CM | POA: Diagnosis not present

## 2019-07-23 DIAGNOSIS — K649 Unspecified hemorrhoids: Secondary | ICD-10-CM | POA: Diagnosis not present

## 2019-07-23 DIAGNOSIS — J309 Allergic rhinitis, unspecified: Secondary | ICD-10-CM

## 2019-07-23 DIAGNOSIS — G4700 Insomnia, unspecified: Secondary | ICD-10-CM | POA: Diagnosis not present

## 2019-07-23 DIAGNOSIS — K219 Gastro-esophageal reflux disease without esophagitis: Secondary | ICD-10-CM | POA: Diagnosis not present

## 2019-07-23 DIAGNOSIS — R32 Unspecified urinary incontinence: Secondary | ICD-10-CM

## 2019-07-23 DIAGNOSIS — Z23 Encounter for immunization: Secondary | ICD-10-CM | POA: Diagnosis not present

## 2019-07-23 DIAGNOSIS — F329 Major depressive disorder, single episode, unspecified: Secondary | ICD-10-CM

## 2019-07-23 DIAGNOSIS — F419 Anxiety disorder, unspecified: Secondary | ICD-10-CM

## 2019-07-23 DIAGNOSIS — G629 Polyneuropathy, unspecified: Secondary | ICD-10-CM

## 2019-07-23 MED ORDER — AMITRIPTYLINE HCL 50 MG PO TABS: 50.0000 mg | ORAL_TABLET | Freq: Every day | ORAL | 0 refills | Status: DC

## 2019-07-23 NOTE — Assessment & Plan Note (Signed)
Doing well on current dose of gabapentin HS, continue same.

## 2019-07-23 NOTE — Assessment & Plan Note (Signed)
Appears stable, answering questions appropriately.  Denies concerns for depression.

## 2019-07-23 NOTE — Assessment & Plan Note (Signed)
Continued despite Zoloft, Melatonin, etc. She would like to resume amitriptyline as she's noticed some improvement after her family member provided her with some two weeks ago.  Wean off of Zoloft, resume amitriptyline at 50 mg. Will slowly titrate as safely as possible.

## 2019-07-23 NOTE — Assessment & Plan Note (Signed)
Repeat BMP pending.

## 2019-07-23 NOTE — Assessment & Plan Note (Signed)
A1C pending.

## 2019-07-23 NOTE — Progress Notes (Signed)
Subjective:    Patient ID: Brittany Hughes, female    DOB: January 21, 1928, 85 y.o.   MRN: [id number]  HPI  Mr. Matthes is an 85 year old female who presents today for follow up. Her family is with her today who is providing most of the information for HPI.  1) Essential Hypertension: Currently managed on lisinopril 5 mg, metoprolol succinate 50 mg. She denies chest pain, dizziness.   BP Readings from Last 3 Encounters:  07/23/19 128/76  08/21/18 130/78  06/28/18 (!) 150/80   2) Vascular Dementia/Depression: Currently managed on sertraline 50 mg. She continues to struggle with difficulty sleeping. She sleeps on the couch and will turn of the TV anywhere between 10 pm to 2 am. When she attempts to go to sleep at night she will have mind racing thoughts of her deceased relatives.   She's tried Melatonin nightly and will have to take a few doses before she falls asleep, sometimes this doesn't work. She's also tried amitriptyline in the past which helped before but then stopped being effective. Her family member did restart her amitriptyline 50 mg tablets two weeks ago and she thinks this has helped. They would like to wean off of Zoloft and retry amitriptyline.   3) Chronic Constipation/IBS/Hemorhoids: Currently managed on Amitiza 24 mg BID. She overall feels well managed and is incorporating better foods into her diet. She does take promethazine as needed for intermittent nausea.   4) Neuropathy: Currently managed on gabapentin 300 mg HS, does well on this regimen.   5) Hypothyroidism: Currently managed on levothyroxine 25 mcg. Her last TSH was 2.33 in October 2019. She takes her levothyroxine every morning with water only. She doesn't take her other medications, including PPI, until lunch.  Review of Systems  Respiratory: Negative for shortness of breath.   Cardiovascular: Negative for chest pain.  Gastrointestinal: Negative for diarrhea.  Genitourinary:       Denies urinary incontinence    Neurological: Negative for dizziness and headaches.  Psychiatric/Behavioral: Positive for sleep disturbance. The patient is nervous/anxious.        Past Medical History:  Diagnosis Date  . Atrophy of vulva   . Carotid stenosis   . Chronic kidney disease   . Depression   . Dermatophytosis of nail   . DVT (deep venous thrombosis) ([place]) 01/06/2014   RLE  . Dysuria   . GERD (gastroesophageal reflux disease)   . Hyperglycemia 04/18/2016  . Hyperlipidemia   . Hyperpotassemia   . Hypertension   . Macular degeneration    "left eye; growth behind that" (01/06/2014)  . Mild cognitive impairment, so stated   . Nephrolithiasis   . Osteoporosis, unspecified   . Other B-complex deficiencies   . Other diseases of larynx   . Other voice and resonance disorders   . Reflux esophagitis   . Seizures ([place])    "one, years ago" (01/06/2014)  . Unspecified hereditary and idiopathic peripheral neuropathy   . Unspecified malignant neoplasm of skin, site unspecified    left foot  . Unspecified urinary incontinence   . Vitamin deficiency      Social History   Socioeconomic History  . Marital status: Widowed    Spouse name: Not on file  . Number of children: Not on file  . Years of education: Not on file  . Highest education level: Not on file  Occupational History  . Not on file  Social Needs  . Financial resource strain: Not on file  .  Food insecurity    Worry: Not on file    Inability: Not on file  . Transportation needs    Medical: Not on file    Non-medical: Not on file  Tobacco Use  . Smoking status: Former Smoker    Packs/day: 0.50    Years: 60.00    Pack years: 30.00    Types: Cigarettes    Quit date: 10/31/2011    Years since quitting: 7.7  . Smokeless tobacco: Never Used  Substance and Sexual Activity  . Alcohol use: No  . Drug use: No  . Sexual activity: Never  Lifestyle  . Physical activity    Days per week: Not on file    Minutes per session: Not on file  . Stress:  Not on file  Relationships  . Social Herbalist on phone: Not on file    Gets together: Not on file    Attends religious service: Not on file    Active member of club or organization: Not on file    Attends meetings of clubs or organizations: Not on file    Relationship status: Not on file  . Intimate partner violence    Fear of current or ex partner: Not on file    Emotionally abused: Not on file    Physically abused: Not on file    Forced sexual activity: Not on file  Other Topics Concern  . Not on file  Social History Narrative  . Not on file    Past Surgical History:  Procedure Laterality Date  . CAROTID ENDARTERECTOMY Right 2006  . CATARACT EXTRACTION W/ INTRAOCULAR LENS  IMPLANT, BILATERAL Bilateral 1990's  . DILATION AND CURETTAGE OF UTERUS    . EXCISIONAL HEMORRHOIDECTOMY  1970's?  Marland Kitchen STOMACH SURGERY  ~ 1960   "had ulcers for years; left scar tissue; did OR" (01/06/2014)  . VAGINAL HYSTERECTOMY  ~ 1980    Family History  Problem Relation Age of Onset  . Heart disease Mother 3  . Kidney disease Father   . Diabetes Sister   . Heart disease Brother 78  . Heart attack Son 55  . Cancer Son        Throat  . Stroke Brother   . Cancer Brother   . Cancer Sister        Kidney    Allergies  Allergen Reactions  . Codeine Other (See Comments) and Nausea And Vomiting    nausea  . Colace [Docusate Calcium] Nausea Only    Current Outpatient Medications on File Prior to Visit  Medication Sig Dispense Refill  . AMITIZA 24 MCG capsule TAKE 1 CAPSULE (24 MCG TOTAL) BY MOUTH 2 (TWO) TIMES DAILY WITH A MEAL. 180 capsule 1  . antiseptic oral rinse (BIOTENE) LIQD 1 application by Mouth Rinse route every 4 (four) hours as needed for dry mouth.    . cetirizine (ZYRTEC) 10 MG tablet Take 1 tablet by mouth at bedtime for allergies. 90 tablet 1  . cholecalciferol (VITAMIN D) 1000 units tablet Take 1,000 Units by mouth daily.    Marland Kitchen docusate sodium (COLACE) 100 MG capsule  Take 100 mg by mouth daily.    Marland Kitchen gabapentin (NEURONTIN) 300 MG capsule TAKE 1 CAPSULE BY MOUTH EVERYDAY AT BEDTIME 90 capsule 1  . levothyroxine (SYNTHROID) 25 MCG tablet 1 TAB EVERY MORNING ON AN EMPTY STOMACH WITH FULL GLASS OF WATER. NO FOOD/MEDS FOR 30 MINUTES. 90 tablet 0  . lidocaine (XYLOCAINE) 5 % ointment Apply 1  application topically 3 (three) times daily as needed. For rectal pain. 35.44 g 0  . lisinopril (ZESTRIL) 5 MG tablet TAKE 1 TABLET BY MOUTH EVERY DAY 90 tablet 1  . Melatonin 3 MG TABS Take 1 tablet (3 mg total) by mouth daily. 30 tablet 0  . metoprolol succinate (TOPROL-XL) 50 MG 24 hr tablet TAKE 1 TABLET BY MOUTH EVERY DAY 90 tablet 0  . omeprazole (PRILOSEC) 20 MG capsule TAKE 1 CAPSULE (20 MG TOTAL) BY MOUTH DAILY. FOR ACID REFLUX 90 capsule 3  . Polyethyl Glycol-Propyl Glycol (SYSTANE) 0.4-0.3 % SOLN Apply 1 drop to eye daily as needed (for dry eyes).    . promethazine (PHENERGAN) 12.5 MG tablet TAKE 1 TABLET BY MOUTH EVERY 12 HOURS AS NEEDED FOR NAUSEA OR VOMITING 30 tablet 0  . vitamin B-12 (CYANOCOBALAMIN) 1000 MCG tablet Take 1,000 mcg by mouth daily.    . hydrocortisone (ANUSOL-HC) 25 MG suppository Place 1 suppository (25 mg total) rectally 2 (two) times daily. (Patient not taking: Reported on 08/21/2018) 12 suppository 0   No current facility-administered medications on file prior to visit.     BP 128/76   Pulse 82   Temp 98 F (36.7 C) (Temporal)   Ht 5\' 1"  (1.549 m)   Wt 153 lb 8 oz (69.6 kg)   SpO2 98%   BMI 29.00 kg/m    Objective:   Physical Exam  Constitutional: She appears well-nourished.  Neck: Neck supple.  Cardiovascular: Normal rate and regular rhythm.  Respiratory: Effort normal and breath sounds normal.  Skin: Skin is warm and dry.  Psychiatric: She has a normal mood and affect.           Assessment & Plan:

## 2019-07-23 NOTE — Assessment & Plan Note (Signed)
Seems to be taking levothyroxine appropriately. Repeat TSH pending. Continue levothyroxine 25 mcg for now.

## 2019-07-23 NOTE — Assessment & Plan Note (Signed)
Breakthrough drainage nearly every morning. Will have her switch to Xyzal from Zyrtec.

## 2019-07-23 NOTE — Assessment & Plan Note (Signed)
No longer on medication, resolved.

## 2019-07-23 NOTE — Assessment & Plan Note (Signed)
Overall stable with softer bowel movements on Amitiza. Continue same.

## 2019-07-23 NOTE — Assessment & Plan Note (Signed)
Stable in the office today, continue current regimen. 

## 2019-07-23 NOTE — Assessment & Plan Note (Signed)
Doing well on PPI, continue same. 

## 2019-07-23 NOTE — Assessment & Plan Note (Signed)
Denies concerns for depression. Suspect underlying anxiety contributing to insomnia at night.  She does wish to wean off of Zoloft as she doesn't find this effective. Discussed other options including other SSRI's for anxiety and she kindly declines. She would like to resume amitriptyline.   Will wean off of Zoloft, resume amitriptyline.

## 2019-07-23 NOTE — Assessment & Plan Note (Signed)
Overall doing well on Amitiza. Also working to incorporate fiber and water. Continue same.

## 2019-07-23 NOTE — Assessment & Plan Note (Signed)
Overall stable, some breakthrough cramping that is tolerable. Continue Amitiza and PRN promethazine.

## 2019-07-23 NOTE — Assessment & Plan Note (Signed)
Repeat CBC pending. 

## 2019-07-23 NOTE — Assessment & Plan Note (Signed)
Repeat lipids pending.  

## 2019-07-23 NOTE — Patient Instructions (Signed)
Stop by the lab prior to leaving today. I will notify you of your results once received.   Cut the sertraline (Zoloft) tablets in half and take 1/2 tablet daily for one week then stop.   Start Amitriptyline 50 mg tablets once nightly for sleep. Please update me in a few weeks as discussed.  It was a pleasure to see you today!

## 2019-07-24 LAB — LIPID PANEL
Cholesterol: 233 mg/dL — ABNORMAL HIGH (ref 0–200)
HDL: 41.6 mg/dL (ref >39.00)
NonHDL: 190.95
Total CHOL/HDL Ratio: 6
Triglycerides: 364 mg/dL — ABNORMAL HIGH (ref 0.0–149.0)
VLDL: 72.8 mg/dL — ABNORMAL HIGH (ref 0.0–40.0)

## 2019-07-24 LAB — CBC
HCT: 33.1 % — ABNORMAL LOW (ref 36.0–46.0)
Hemoglobin: 10.4 g/dL — ABNORMAL LOW (ref 12.0–15.0)
MCHC: 31.6 g/dL (ref 30.0–36.0)
MCV: 79.8 fl (ref 78.0–100.0)
Platelets: 180 10*3/uL (ref 150.0–400.0)
RBC: 4.14 Mil/uL (ref 3.87–5.11)
RDW: 18.2 % — ABNORMAL HIGH (ref 11.5–15.5)
WBC: 9.3 10*3/uL (ref 4.0–10.5)

## 2019-07-24 LAB — COMPREHENSIVE METABOLIC PANEL
ALT: 8 U/L (ref 0–35)
AST: 13 U/L (ref 0–37)
Albumin: 4 g/dL (ref 3.5–5.2)
Alkaline Phosphatase: 104 U/L (ref 39–117)
BUN: 16 mg/dL (ref 6–23)
CO2: 28 mEq/L (ref 19–32)
Calcium: 9.6 mg/dL (ref 8.4–10.5)
Chloride: 103 mEq/L (ref 96–112)
Creatinine, Ser: 1.12 mg/dL (ref 0.40–1.20)
GFR: 45.81 mL/min — ABNORMAL LOW (ref >60.00)
Glucose, Bld: 99 mg/dL (ref 70–99)
Potassium: 4.5 mEq/L (ref 3.5–5.1)
Sodium: 140 mEq/L (ref 135–145)
Total Bilirubin: 0.3 mg/dL (ref 0.2–1.2)
Total Protein: 7.1 g/dL (ref 6.0–8.3)

## 2019-07-24 LAB — HEMOGLOBIN A1C: Hgb A1c MFr Bld: 6.5 % (ref 4.6–6.5)

## 2019-07-24 LAB — TSH: TSH: 2.81 u[IU]/mL (ref 0.35–4.50)

## 2019-07-24 LAB — LDL CHOLESTEROL, DIRECT: Direct LDL: 137 mg/dL

## 2019-08-18 DIAGNOSIS — J309 Allergic rhinitis, unspecified: Secondary | ICD-10-CM

## 2019-08-18 MED ORDER — LEVOCETIRIZINE DIHYDROCHLORIDE 5 MG PO TABS: 5.0000 mg | ORAL_TABLET | Freq: Every evening | ORAL | 0 refills | Status: DC

## 2019-08-25 DIAGNOSIS — M25552 Pain in left hip: Secondary | ICD-10-CM | POA: Diagnosis not present

## 2019-08-28 DIAGNOSIS — M25552 Pain in left hip: Secondary | ICD-10-CM | POA: Diagnosis not present

## 2019-09-03 DIAGNOSIS — G4700 Insomnia, unspecified: Secondary | ICD-10-CM

## 2019-09-03 MED ORDER — AMITRIPTYLINE HCL 75 MG PO TABS: 75.0000 mg | ORAL_TABLET | Freq: Every day | ORAL | 0 refills | Status: AC

## 2019-09-03 MED ORDER — AMITRIPTYLINE HCL 50 MG PO TABS: 75.0000 mg | ORAL_TABLET | Freq: Every day | ORAL | 0 refills | Status: DC

## 2019-09-03 NOTE — Telephone Encounter (Signed)
Brittany Hughes, will you call the pharmacy to cancel the 50 mg tablet of amitriptyline? She is to pick up the 75 mg dose.

## 2019-09-04 NOTE — Telephone Encounter (Signed)
Called [facility] and cancel the 50 mg. I have place PA for the 75 mg

## 2019-09-10 DIAGNOSIS — M25552 Pain in left hip: Secondary | ICD-10-CM | POA: Diagnosis not present

## 2019-09-12 ENCOUNTER — Inpatient Hospital Stay
Admission: EM | Admit: 2019-09-12 | Discharge: 2019-09-19 | DRG: 477 | Disposition: A | Discharge status: Skilled Nursing Facility | Payer: Medicare Other | Attending: Internal Medicine | Admitting: Internal Medicine

## 2019-09-12 ENCOUNTER — Other Ambulatory Visit: Payer: Self-pay

## 2019-09-12 ENCOUNTER — Inpatient Hospital Stay: Payer: Medicare Other

## 2019-09-12 ENCOUNTER — Emergency Department: Payer: Medicare Other

## 2019-09-12 DIAGNOSIS — E559 Vitamin D deficiency, unspecified: Secondary | ICD-10-CM | POA: Diagnosis present

## 2019-09-12 DIAGNOSIS — K5900 Constipation, unspecified: Secondary | ICD-10-CM | POA: Diagnosis not present

## 2019-09-12 DIAGNOSIS — R262 Difficulty in walking, not elsewhere classified: Secondary | ICD-10-CM | POA: Diagnosis not present

## 2019-09-12 DIAGNOSIS — K30 Functional dyspepsia: Secondary | ICD-10-CM | POA: Diagnosis not present

## 2019-09-12 DIAGNOSIS — N838 Other noninflammatory disorders of ovary, fallopian tube and broad ligament: Secondary | ICD-10-CM | POA: Diagnosis present

## 2019-09-12 DIAGNOSIS — N179 Acute kidney failure, unspecified: Secondary | ICD-10-CM | POA: Diagnosis present

## 2019-09-12 DIAGNOSIS — R918 Other nonspecific abnormal finding of lung field: Secondary | ICD-10-CM | POA: Diagnosis not present

## 2019-09-12 DIAGNOSIS — I700 Atherosclerosis of aorta: Secondary | ICD-10-CM | POA: Diagnosis present

## 2019-09-12 DIAGNOSIS — F0390 Unspecified dementia without behavioral disturbance: Secondary | ICD-10-CM | POA: Diagnosis present

## 2019-09-12 DIAGNOSIS — Z7189 Other specified counseling: Secondary | ICD-10-CM | POA: Diagnosis not present

## 2019-09-12 DIAGNOSIS — R569 Unspecified convulsions: Secondary | ICD-10-CM | POA: Diagnosis not present

## 2019-09-12 DIAGNOSIS — S3282XA Multiple fractures of pelvis without disruption of pelvic ring, initial encounter for closed fracture: Secondary | ICD-10-CM | POA: Diagnosis not present

## 2019-09-12 DIAGNOSIS — I358 Other nonrheumatic aortic valve disorders: Secondary | ICD-10-CM | POA: Diagnosis present

## 2019-09-12 DIAGNOSIS — C3401 Malignant neoplasm of right main bronchus: Secondary | ICD-10-CM | POA: Diagnosis present

## 2019-09-12 DIAGNOSIS — K5909 Other constipation: Secondary | ICD-10-CM | POA: Diagnosis present

## 2019-09-12 DIAGNOSIS — Z961 Presence of intraocular lens: Secondary | ICD-10-CM | POA: Diagnosis present

## 2019-09-12 DIAGNOSIS — Z20828 Contact with and (suspected) exposure to other viral communicable diseases: Secondary | ICD-10-CM | POA: Diagnosis present

## 2019-09-12 DIAGNOSIS — G8929 Other chronic pain: Secondary | ICD-10-CM | POA: Diagnosis not present

## 2019-09-12 DIAGNOSIS — E785 Hyperlipidemia, unspecified: Secondary | ICD-10-CM | POA: Diagnosis not present

## 2019-09-12 DIAGNOSIS — J440 Chronic obstructive pulmonary disease with acute lower respiratory infection: Secondary | ICD-10-CM | POA: Diagnosis present

## 2019-09-12 DIAGNOSIS — N200 Calculus of kidney: Secondary | ICD-10-CM | POA: Diagnosis present

## 2019-09-12 DIAGNOSIS — Z885 Allergy status to narcotic agent status: Secondary | ICD-10-CM

## 2019-09-12 DIAGNOSIS — Z66 Do not resuscitate: Secondary | ICD-10-CM | POA: Diagnosis not present

## 2019-09-12 DIAGNOSIS — E039 Hypothyroidism, unspecified: Secondary | ICD-10-CM | POA: Diagnosis present

## 2019-09-12 DIAGNOSIS — Q5039 Other congenital malformation of ovary: Secondary | ICD-10-CM | POA: Diagnosis not present

## 2019-09-12 DIAGNOSIS — J181 Lobar pneumonia, unspecified organism: Secondary | ICD-10-CM | POA: Diagnosis not present

## 2019-09-12 DIAGNOSIS — R509 Fever, unspecified: Secondary | ICD-10-CM | POA: Diagnosis not present

## 2019-09-12 DIAGNOSIS — S329XXD Fracture of unspecified parts of lumbosacral spine and pelvis, subsequent encounter for fracture with routine healing: Secondary | ICD-10-CM | POA: Diagnosis not present

## 2019-09-12 DIAGNOSIS — K219 Gastro-esophageal reflux disease without esophagitis: Secondary | ICD-10-CM | POA: Diagnosis present

## 2019-09-12 DIAGNOSIS — I82401 Acute embolism and thrombosis of unspecified deep veins of right lower extremity: Secondary | ICD-10-CM | POA: Diagnosis not present

## 2019-09-12 DIAGNOSIS — Z515 Encounter for palliative care: Secondary | ICD-10-CM | POA: Diagnosis not present

## 2019-09-12 DIAGNOSIS — I959 Hypotension, unspecified: Secondary | ICD-10-CM | POA: Diagnosis not present

## 2019-09-12 DIAGNOSIS — Z808 Family history of malignant neoplasm of other organs or systems: Secondary | ICD-10-CM

## 2019-09-12 DIAGNOSIS — E538 Deficiency of other specified B group vitamins: Secondary | ICD-10-CM | POA: Diagnosis present

## 2019-09-12 DIAGNOSIS — R404 Transient alteration of awareness: Secondary | ICD-10-CM | POA: Diagnosis not present

## 2019-09-12 DIAGNOSIS — R1900 Intra-abdominal and pelvic swelling, mass and lump, unspecified site: Secondary | ICD-10-CM | POA: Diagnosis not present

## 2019-09-12 DIAGNOSIS — E1122 Type 2 diabetes mellitus with diabetic chronic kidney disease: Secondary | ICD-10-CM | POA: Diagnosis present

## 2019-09-12 DIAGNOSIS — G4700 Insomnia, unspecified: Secondary | ICD-10-CM | POA: Diagnosis not present

## 2019-09-12 DIAGNOSIS — Z79899 Other long term (current) drug therapy: Secondary | ICD-10-CM

## 2019-09-12 DIAGNOSIS — N183 Chronic kidney disease, stage 3 unspecified: Secondary | ICD-10-CM | POA: Diagnosis present

## 2019-09-12 DIAGNOSIS — S3282XD Multiple fractures of pelvis without disruption of pelvic ring, subsequent encounter for fracture with routine healing: Secondary | ICD-10-CM | POA: Diagnosis not present

## 2019-09-12 DIAGNOSIS — N10 Acute tubulo-interstitial nephritis: Secondary | ICD-10-CM | POA: Diagnosis not present

## 2019-09-12 DIAGNOSIS — R531 Weakness: Secondary | ICD-10-CM | POA: Diagnosis not present

## 2019-09-12 DIAGNOSIS — Z823 Family history of stroke: Secondary | ICD-10-CM

## 2019-09-12 DIAGNOSIS — M899 Disorder of bone, unspecified: Secondary | ICD-10-CM | POA: Diagnosis not present

## 2019-09-12 DIAGNOSIS — C7972 Secondary malignant neoplasm of left adrenal gland: Secondary | ICD-10-CM | POA: Diagnosis present

## 2019-09-12 DIAGNOSIS — J449 Chronic obstructive pulmonary disease, unspecified: Secondary | ICD-10-CM | POA: Diagnosis not present

## 2019-09-12 DIAGNOSIS — S3289XA Fracture of other parts of pelvis, initial encounter for closed fracture: Secondary | ICD-10-CM | POA: Diagnosis not present

## 2019-09-12 DIAGNOSIS — M4858XA Collapsed vertebra, not elsewhere classified, sacral and sacrococcygeal region, initial encounter for fracture: Principal | ICD-10-CM | POA: Diagnosis present

## 2019-09-12 DIAGNOSIS — S32810D Multiple fractures of pelvis with stable disruption of pelvic ring, subsequent encounter for fracture with routine healing: Secondary | ICD-10-CM | POA: Diagnosis not present

## 2019-09-12 DIAGNOSIS — Z791 Long term (current) use of non-steroidal anti-inflammatories (NSAID): Secondary | ICD-10-CM

## 2019-09-12 DIAGNOSIS — Z8051 Family history of malignant neoplasm of kidney: Secondary | ICD-10-CM

## 2019-09-12 DIAGNOSIS — Z90710 Acquired absence of both cervix and uterus: Secondary | ICD-10-CM

## 2019-09-12 DIAGNOSIS — H04123 Dry eye syndrome of bilateral lacrimal glands: Secondary | ICD-10-CM | POA: Diagnosis not present

## 2019-09-12 DIAGNOSIS — E875 Hyperkalemia: Secondary | ICD-10-CM | POA: Diagnosis not present

## 2019-09-12 DIAGNOSIS — M810 Age-related osteoporosis without current pathological fracture: Secondary | ICD-10-CM | POA: Diagnosis not present

## 2019-09-12 DIAGNOSIS — Z7401 Bed confinement status: Secondary | ICD-10-CM | POA: Diagnosis not present

## 2019-09-12 DIAGNOSIS — M84550A Pathological fracture in neoplastic disease, pelvis, initial encounter for fracture: Secondary | ICD-10-CM | POA: Diagnosis not present

## 2019-09-12 DIAGNOSIS — G3184 Mild cognitive impairment, so stated: Secondary | ICD-10-CM | POA: Diagnosis not present

## 2019-09-12 DIAGNOSIS — Z9181 History of falling: Secondary | ICD-10-CM

## 2019-09-12 DIAGNOSIS — H3530 Unspecified macular degeneration: Secondary | ICD-10-CM | POA: Diagnosis not present

## 2019-09-12 DIAGNOSIS — Z85828 Personal history of other malignant neoplasm of skin: Secondary | ICD-10-CM

## 2019-09-12 DIAGNOSIS — Z9841 Cataract extraction status, right eye: Secondary | ICD-10-CM

## 2019-09-12 DIAGNOSIS — C7652 Malignant neoplasm of left lower limb: Secondary | ICD-10-CM | POA: Diagnosis not present

## 2019-09-12 DIAGNOSIS — G894 Chronic pain syndrome: Secondary | ICD-10-CM | POA: Diagnosis present

## 2019-09-12 DIAGNOSIS — S32591A Other specified fracture of right pubis, initial encounter for closed fracture: Secondary | ICD-10-CM | POA: Diagnosis not present

## 2019-09-12 DIAGNOSIS — Z888 Allergy status to other drugs, medicaments and biological substances status: Secondary | ICD-10-CM

## 2019-09-12 DIAGNOSIS — M2550 Pain in unspecified joint: Secondary | ICD-10-CM | POA: Diagnosis not present

## 2019-09-12 DIAGNOSIS — J9601 Acute respiratory failure with hypoxia: Secondary | ICD-10-CM | POA: Diagnosis present

## 2019-09-12 DIAGNOSIS — K6289 Other specified diseases of anus and rectum: Secondary | ICD-10-CM | POA: Diagnosis not present

## 2019-09-12 DIAGNOSIS — J189 Pneumonia, unspecified organism: Secondary | ICD-10-CM | POA: Diagnosis present

## 2019-09-12 DIAGNOSIS — F329 Major depressive disorder, single episode, unspecified: Secondary | ICD-10-CM | POA: Diagnosis not present

## 2019-09-12 DIAGNOSIS — E1142 Type 2 diabetes mellitus with diabetic polyneuropathy: Secondary | ICD-10-CM | POA: Diagnosis present

## 2019-09-12 DIAGNOSIS — T148XXA Other injury of unspecified body region, initial encounter: Secondary | ICD-10-CM | POA: Diagnosis not present

## 2019-09-12 DIAGNOSIS — Z8249 Family history of ischemic heart disease and other diseases of the circulatory system: Secondary | ICD-10-CM

## 2019-09-12 DIAGNOSIS — Z9842 Cataract extraction status, left eye: Secondary | ICD-10-CM

## 2019-09-12 DIAGNOSIS — H9190 Unspecified hearing loss, unspecified ear: Secondary | ICD-10-CM | POA: Diagnosis present

## 2019-09-12 DIAGNOSIS — R41841 Cognitive communication deficit: Secondary | ICD-10-CM | POA: Diagnosis not present

## 2019-09-12 DIAGNOSIS — C9000 Multiple myeloma not having achieved remission: Secondary | ICD-10-CM

## 2019-09-12 DIAGNOSIS — N12 Tubulo-interstitial nephritis, not specified as acute or chronic: Secondary | ICD-10-CM | POA: Diagnosis present

## 2019-09-12 DIAGNOSIS — Z87891 Personal history of nicotine dependence: Secondary | ICD-10-CM

## 2019-09-12 DIAGNOSIS — S32810A Multiple fractures of pelvis with stable disruption of pelvic ring, initial encounter for closed fracture: Secondary | ICD-10-CM | POA: Diagnosis not present

## 2019-09-12 DIAGNOSIS — R1314 Dysphagia, pharyngoesophageal phase: Secondary | ICD-10-CM | POA: Diagnosis not present

## 2019-09-12 DIAGNOSIS — Z79890 Hormone replacement therapy (postmenopausal): Secondary | ICD-10-CM

## 2019-09-12 DIAGNOSIS — R590 Localized enlarged lymph nodes: Secondary | ICD-10-CM | POA: Diagnosis not present

## 2019-09-12 DIAGNOSIS — R739 Hyperglycemia, unspecified: Secondary | ICD-10-CM | POA: Diagnosis not present

## 2019-09-12 DIAGNOSIS — I129 Hypertensive chronic kidney disease with stage 1 through stage 4 chronic kidney disease, or unspecified chronic kidney disease: Secondary | ICD-10-CM | POA: Diagnosis present

## 2019-09-12 DIAGNOSIS — E43 Unspecified severe protein-calorie malnutrition: Secondary | ICD-10-CM | POA: Diagnosis not present

## 2019-09-12 DIAGNOSIS — R102 Pelvic and perineal pain: Secondary | ICD-10-CM | POA: Diagnosis not present

## 2019-09-12 DIAGNOSIS — M792 Neuralgia and neuritis, unspecified: Secondary | ICD-10-CM | POA: Diagnosis not present

## 2019-09-12 DIAGNOSIS — Z981 Arthrodesis status: Secondary | ICD-10-CM | POA: Diagnosis not present

## 2019-09-12 DIAGNOSIS — R319 Hematuria, unspecified: Secondary | ICD-10-CM | POA: Diagnosis not present

## 2019-09-12 DIAGNOSIS — I10 Essential (primary) hypertension: Secondary | ICD-10-CM | POA: Diagnosis not present

## 2019-09-12 DIAGNOSIS — N390 Urinary tract infection, site not specified: Secondary | ICD-10-CM | POA: Diagnosis not present

## 2019-09-12 DIAGNOSIS — M25552 Pain in left hip: Secondary | ICD-10-CM

## 2019-09-12 DIAGNOSIS — Z86718 Personal history of other venous thrombosis and embolism: Secondary | ICD-10-CM

## 2019-09-12 DIAGNOSIS — Z833 Family history of diabetes mellitus: Secondary | ICD-10-CM

## 2019-09-12 DIAGNOSIS — Z87442 Personal history of urinary calculi: Secondary | ICD-10-CM

## 2019-09-12 DIAGNOSIS — R52 Pain, unspecified: Secondary | ICD-10-CM | POA: Diagnosis not present

## 2019-09-12 DIAGNOSIS — R0902 Hypoxemia: Secondary | ICD-10-CM | POA: Diagnosis not present

## 2019-09-12 DIAGNOSIS — R1312 Dysphagia, oropharyngeal phase: Secondary | ICD-10-CM | POA: Diagnosis not present

## 2019-09-12 DIAGNOSIS — M8440XD Pathological fracture, unspecified site, subsequent encounter for fracture with routine healing: Secondary | ICD-10-CM | POA: Diagnosis not present

## 2019-09-12 LAB — URINALYSIS, COMPLETE (UACMP) WITH MICROSCOPIC
Bilirubin Urine: NEGATIVE
Glucose, UA: NEGATIVE mg/dL
Hgb urine dipstick: NEGATIVE
Ketones, ur: NEGATIVE mg/dL
Nitrite: NEGATIVE
Protein, ur: 100 mg/dL — AB
RBC / HPF: >50 RBC/hpf — ABNORMAL HIGH (ref 0–5)
Specific Gravity, Urine: 1.02 (ref 1.005–1.030)
WBC, UA: >50 WBC/hpf — ABNORMAL HIGH (ref 0–5)
pH: 5 (ref 5.0–8.0)

## 2019-09-12 LAB — CBC
HCT: 32.9 % — ABNORMAL LOW (ref 36.0–46.0)
Hemoglobin: 10.3 g/dL — ABNORMAL LOW (ref 12.0–15.0)
MCH: 25.5 pg — ABNORMAL LOW (ref 26.0–34.0)
MCHC: 31.3 g/dL (ref 30.0–36.0)
MCV: 81.4 fL (ref 80.0–100.0)
Platelets: 200 10*3/uL (ref 150–400)
RBC: 4.04 MIL/uL (ref 3.87–5.11)
RDW: 16.8 % — ABNORMAL HIGH (ref 11.5–15.5)
WBC: 18 10*3/uL — ABNORMAL HIGH (ref 4.0–10.5)
nRBC: 0 % (ref 0.0–0.2)

## 2019-09-12 LAB — COMPREHENSIVE METABOLIC PANEL
ALT: 14 U/L (ref 0–44)
AST: 19 U/L (ref 15–41)
Albumin: 3.3 g/dL — ABNORMAL LOW (ref 3.5–5.0)
Alkaline Phosphatase: 137 U/L — ABNORMAL HIGH (ref 38–126)
Anion gap: 10 (ref 5–15)
BUN: 28 mg/dL — ABNORMAL HIGH (ref 8–23)
CO2: 25 mmol/L (ref 22–32)
Calcium: 9.8 mg/dL (ref 8.9–10.3)
Chloride: 102 mmol/L (ref 98–111)
Creatinine, Ser: 1.4 mg/dL — ABNORMAL HIGH (ref 0.44–1.00)
GFR calc Af Amer: 39 mL/min — ABNORMAL LOW (ref >60)
GFR calc non Af Amer: 33 mL/min — ABNORMAL LOW (ref >60)
Glucose, Bld: 90 mg/dL (ref 70–99)
Potassium: 4.3 mmol/L (ref 3.5–5.1)
Sodium: 137 mmol/L (ref 135–145)
Total Bilirubin: 0.7 mg/dL (ref 0.3–1.2)
Total Protein: 7 g/dL (ref 6.5–8.1)

## 2019-09-12 LAB — SARS CORONAVIRUS 2 (TAT 6-24 HRS): SARS Coronavirus 2: NEGATIVE

## 2019-09-12 MED ORDER — PANTOPRAZOLE SODIUM 40 MG PO TBEC
40.0000 mg | DELAYED_RELEASE_TABLET | Freq: Every day | ORAL | Status: DC
  Administered 2019-09-13 – 2019-09-19 (×7): 40 mg via ORAL
  Filled 2019-09-12 (×7): qty 1

## 2019-09-12 MED ORDER — LEVOTHYROXINE SODIUM 25 MCG PO TABS
25.0000 ug | ORAL_TABLET | Freq: Every day | ORAL | Status: DC
  Administered 2019-09-13 – 2019-09-19 (×7): 25 ug via ORAL
  Filled 2019-09-12 (×8): qty 1

## 2019-09-12 MED ORDER — HYDROCODONE-ACETAMINOPHEN 5-325 MG PO TABS
1.0000 | ORAL_TABLET | ORAL | Status: DC | PRN
  Administered 2019-09-12 – 2019-09-13 (×3): 2 via ORAL
  Filled 2019-09-12 (×3): qty 2

## 2019-09-12 MED ORDER — SODIUM CHLORIDE 0.9 % IV SOLN
500.0000 mg | Freq: Once | INTRAVENOUS | Status: AC
  Administered 2019-09-12: 500 mg via INTRAVENOUS
  Filled 2019-09-12: qty 500

## 2019-09-12 MED ORDER — BISACODYL 10 MG RE SUPP
10.0000 mg | Freq: Every day | RECTAL | Status: DC | PRN
  Filled 2019-09-12: qty 1

## 2019-09-12 MED ORDER — SODIUM CHLORIDE 0.9 % IV SOLN
500.0000 mg | INTRAVENOUS | Status: DC
  Administered 2019-09-13 – 2019-09-15 (×3): 500 mg via INTRAVENOUS
  Filled 2019-09-12 (×5): qty 500

## 2019-09-12 MED ORDER — METOPROLOL SUCCINATE ER 50 MG PO TB24
50.0000 mg | ORAL_TABLET | Freq: Every day | ORAL | Status: DC
  Administered 2019-09-12 – 2019-09-19 (×8): 50 mg via ORAL
  Filled 2019-09-12 (×2): qty 1
  Filled 2019-09-12: qty 2
  Filled 2019-09-12 (×6): qty 1

## 2019-09-12 MED ORDER — PROMETHAZINE HCL 25 MG PO TABS
12.5000 mg | ORAL_TABLET | Freq: Two times a day (BID) | ORAL | Status: DC | PRN
  Filled 2019-09-12: qty 1

## 2019-09-12 MED ORDER — AMITRIPTYLINE HCL 25 MG PO TABS
75.0000 mg | ORAL_TABLET | Freq: Every day | ORAL | Status: DC
  Administered 2019-09-12 – 2019-09-18 (×7): 75 mg via ORAL
  Filled 2019-09-12 (×2): qty 1
  Filled 2019-09-12: qty 3
  Filled 2019-09-12: qty 2
  Filled 2019-09-12 (×2): qty 3
  Filled 2019-09-12 (×2): qty 1

## 2019-09-12 MED ORDER — SODIUM CHLORIDE 0.9 % IV SOLN
1.0000 g | INTRAVENOUS | Status: DC
  Administered 2019-09-13: 1 g via INTRAVENOUS
  Filled 2019-09-12: qty 1
  Filled 2019-09-12: qty 10

## 2019-09-12 MED ORDER — ONDANSETRON HCL 4 MG/2ML IJ SOLN
4.0000 mg | Freq: Four times a day (QID) | INTRAMUSCULAR | Status: DC | PRN
  Administered 2019-09-17: 4 mg via INTRAVENOUS
  Filled 2019-09-12: qty 2

## 2019-09-12 MED ORDER — MELATONIN 5 MG PO TABS
5.0000 mg | ORAL_TABLET | Freq: Every day | ORAL | Status: DC
  Administered 2019-09-14 – 2019-09-18 (×6): 5 mg via ORAL
  Filled 2019-09-12 (×9): qty 1

## 2019-09-12 MED ORDER — VITAMIN B-12 1000 MCG PO TABS
1000.0000 ug | ORAL_TABLET | Freq: Every day | ORAL | Status: DC
  Administered 2019-09-13 – 2019-09-19 (×6): 1000 ug via ORAL
  Filled 2019-09-12 (×7): qty 1

## 2019-09-12 MED ORDER — SODIUM CHLORIDE 0.9 % IV SOLN
1.0000 g | Freq: Once | INTRAVENOUS | Status: AC
  Administered 2019-09-12: 1 g via INTRAVENOUS
  Filled 2019-09-12: qty 10

## 2019-09-12 MED ORDER — ONDANSETRON HCL 4 MG PO TABS
4.0000 mg | ORAL_TABLET | Freq: Four times a day (QID) | ORAL | Status: DC | PRN
  Filled 2019-09-12: qty 1

## 2019-09-12 MED ORDER — ACETAMINOPHEN 325 MG PO TABS
650.0000 mg | ORAL_TABLET | Freq: Four times a day (QID) | ORAL | Status: DC | PRN
  Administered 2019-09-16 – 2019-09-17 (×2): 650 mg via ORAL
  Filled 2019-09-12 (×2): qty 2

## 2019-09-12 MED ORDER — ACETAMINOPHEN 650 MG RE SUPP
650.0000 mg | Freq: Four times a day (QID) | RECTAL | Status: DC | PRN
  Filled 2019-09-12: qty 1

## 2019-09-12 MED ORDER — ENOXAPARIN SODIUM 30 MG/0.3ML ~~LOC~~ SOLN
30.0000 mg | SUBCUTANEOUS | Status: DC
  Administered 2019-09-12: 30 mg via SUBCUTANEOUS
  Filled 2019-09-12: qty 0.3

## 2019-09-12 MED ORDER — BIOTENE DRY MOUTH MT LIQD: 1.0000 "application " | OROMUCOSAL | Status: DC | PRN

## 2019-09-12 MED ORDER — DOCUSATE SODIUM 100 MG PO CAPS
100.0000 mg | ORAL_CAPSULE | Freq: Every day | ORAL | Status: DC
  Administered 2019-09-14 – 2019-09-19 (×5): 100 mg via ORAL
  Filled 2019-09-12 (×5): qty 1

## 2019-09-12 MED ORDER — SENNOSIDES-DOCUSATE SODIUM 8.6-50 MG PO TABS
1.0000 | ORAL_TABLET | Freq: Every evening | ORAL | Status: DC | PRN
  Administered 2019-09-14: 1 via ORAL
  Filled 2019-09-12: qty 1

## 2019-09-12 MED ORDER — GABAPENTIN 300 MG PO CAPS
300.0000 mg | ORAL_CAPSULE | Freq: Every day | ORAL | Status: DC
  Administered 2019-09-12 – 2019-09-18 (×7): 300 mg via ORAL
  Filled 2019-09-12 (×7): qty 1

## 2019-09-12 NOTE — ED Notes (Signed)
Hospitalist at bedside 

## 2019-09-12 NOTE — Progress Notes (Signed)
PHARMACIST - PHYSICIAN COMMUNICATION  CONCERNING:  Enoxaparin (Lovenox) for DVT Prophylaxis    RECOMMENDATION: Patient was prescribed enoxaprin 40mg  q24 hours for VTE prophylaxis.   Filed Weights   09/12/19 1140  Weight: 150 lb (68 kg)    Body mass index is 27.44 kg/m.  Estimated Creatinine Clearance: 24.6 mL/min (A) (by C-G formula based on SCr of 1.4 mg/dL (H)).   Patient is candidate for enoxaparin 30mg  every 24 hours based on CrCl <74ml/min or Weight less then 45kg   DESCRIPTION: Pharmacy has adjusted enoxaparin dose per [facility] policy.   Patient is now receiving enoxaparin 30mg  every 24 hours.  Rowland Lathe, PharmD Clinical Pharmacist  09/12/2019 3:15 PM

## 2019-09-12 NOTE — ED Notes (Signed)
Took pt to decon room to place her on a bedpan as she states she needs to pee- pt unable to urinate at this time

## 2019-09-12 NOTE — ED Notes (Signed)
Pt given dinner tray, no further needs at this time.

## 2019-09-12 NOTE — ED Notes (Signed)
Pt resting calmly in bed. Still eating. Rail up. Bed locked low.

## 2019-09-12 NOTE — ED Provider Notes (Signed)
[facility] Emergency Department Provider Note  Time seen: 11:49 AM  I have reviewed the triage vital signs and the nursing notes.   HISTORY  Chief Complaint Left flank pain, hip pain  HPI Brittany Hughes is a 85 y.o. female with a past medical history of depression, CKD, gastric reflux, hypertension, hyperlipidemia, presents to the emergency department for left-sided pain.  Patient is a quite poor historian, but according to the patient she came to the emergency department today because she has been experiencing pain she points to her left side/left back and states it has been going down into her left leg.  Patient denies any trauma.  Patient states she "thinks she got an injection" recently but cannot tell me for sure and cannot tell me when.  Patient does not believe she is having dysuria but is not sure.  Denies any pain going down the leg at this time.  Past Medical History:  Diagnosis Date  . Atrophy of vulva   . Baker's cyst 04/18/2016  . Carotid stenosis   . Chronic kidney disease   . Depression   . Dermatophytosis of nail   . DVT (deep venous thrombosis) ([place]) 01/06/2014   RLE  . Dysuria   . GERD (gastroesophageal reflux disease)   . Hyperglycemia 04/18/2016  . Hyperlipidemia   . Hyperpotassemia   . Hypertension   . Macular degeneration    "left eye; growth behind that" (01/06/2014)  . Mild cognitive impairment, so stated   . Nephrolithiasis   . Osteoporosis, unspecified   . Other B-complex deficiencies   . Other diseases of larynx   . Other voice and resonance disorders   . Reflux esophagitis   . Right wrist fracture 01/06/2014  . Seizures ([place])    "one, years ago" (01/06/2014)  . Unspecified hereditary and idiopathic peripheral neuropathy   . Unspecified malignant neoplasm of skin, site unspecified    left foot  . Unspecified urinary incontinence   . Vitamin deficiency     Patient Active Problem List   Diagnosis Date Noted  . Hemorrhoids  02/15/2018  . Allergic rhinitis 02/15/2018  . Urinary incontinence 10/03/2017  . Abnormality of gait 06/20/2016  . Chronic constipation 04/18/2016  . Hyperglycemia 04/18/2016  . Anemia 04/18/2016  . Hypothyroidism 04/18/2016  . IBS (irritable bowel syndrome) 09/29/2014  . Essential hypertension 09/29/2014  . Neuropathy 09/29/2014  . Insomnia 09/29/2014  . Leg edema, left 04/07/2014  . Dementia, vascular, with depression ([place]) 03/11/2014  . GERD (gastroesophageal reflux disease) 01/06/2014  . Frequent falls 01/06/2014  . Hyperlipidemia with target LDL less than 100 11/12/2013  . Arthritis of left knee 11/12/2013  . Atrophy of vulva 02/18/2013  . Malunion and nonunion of fracture(733.8) 02/18/2013  . Vitamin D deficiency 02/18/2013  . Chronic kidney disease, stage III (moderate) 02/18/2013  . Other B-complex deficiencies 02/18/2013  . Tobacco use disorder 02/18/2013  . Other diseases of vocal cords 02/18/2013  . Anxiety and depression 02/18/2013  . Chronic pain syndrome 02/18/2013  . Deafness or hearing loss of type classifiable to 389.0 with type classifiable to 389.1 02/18/2013  . Occlusion and stenosis of carotid artery without mention of cerebral infarction 02/18/2013    Past Surgical History:  Procedure Laterality Date  . CAROTID ENDARTERECTOMY Right 2006  . CATARACT EXTRACTION W/ INTRAOCULAR LENS  IMPLANT, BILATERAL Bilateral 1990's  . DILATION AND CURETTAGE OF UTERUS    . EXCISIONAL HEMORRHOIDECTOMY  1970's?  Marland Kitchen STOMACH SURGERY  ~ 1960   "  had ulcers for years; left scar tissue; did OR" (01/06/2014)  . VAGINAL HYSTERECTOMY  ~ 1980    Prior to Admission medications   Medication Sig Start Date End Date Taking? Authorizing Provider  AMITIZA 24 MCG capsule TAKE 1 CAPSULE (24 MCG TOTAL) BY MOUTH 2 (TWO) TIMES DAILY WITH A MEAL. 03/26/19   Pleas Koch, NP  amitriptyline (ELAVIL) 75 MG tablet Take 1 tablet (75 mg total) by mouth at bedtime. For sleep. 09/03/19   Pleas Koch, NP  antiseptic oral rinse (BIOTENE) LIQD 1 application by Mouth Rinse route every 4 (four) hours as needed for dry mouth.    [provider]  cholecalciferol (VITAMIN D) 1000 units tablet Take 1,000 Units by mouth daily.    [provider]  docusate sodium (COLACE) 100 MG capsule Take 100 mg by mouth daily.    [provider]  gabapentin (NEURONTIN) 300 MG capsule TAKE 1 CAPSULE BY MOUTH EVERYDAY AT BEDTIME 03/26/19   Pleas Koch, NP  hydrocortisone (ANUSOL-HC) 25 MG suppository Place 1 suppository (25 mg total) rectally 2 (two) times daily. Patient not taking: Reported on 08/21/2018 06/28/18   Tonia Ghent, MD  levocetirizine (XYZAL) 5 MG tablet Take 1 tablet (5 mg total) by mouth every evening. For allergies. 08/18/19   Pleas Koch, NP  levothyroxine (SYNTHROID) 25 MCG tablet 1 TAB EVERY MORNING ON AN EMPTY STOMACH WITH FULL GLASS OF WATER. NO FOOD/MEDS FOR 30 MINUTES. 06/25/19   Pleas Koch, NP  lidocaine (XYLOCAINE) 5 % ointment Apply 1 application topically 3 (three) times daily as needed. For rectal pain. 06/28/18   Tonia Ghent, MD  lisinopril (ZESTRIL) 5 MG tablet TAKE 1 TABLET BY MOUTH EVERY DAY 03/26/19   Pleas Koch, NP  Melatonin 3 MG TABS Take 1 tablet (3 mg total) by mouth daily. 09/29/14   Blanchie Serve, MD  metoprolol succinate (TOPROL-XL) 50 MG 24 hr tablet TAKE 1 TABLET BY MOUTH EVERY DAY 06/25/19   Pleas Koch, NP  omeprazole (PRILOSEC) 20 MG capsule TAKE 1 CAPSULE (20 MG TOTAL) BY MOUTH DAILY. FOR ACID REFLUX 12/31/18   Pleas Koch, NP  Polyethyl Glycol-Propyl Glycol (SYSTANE) 0.4-0.3 % SOLN Apply 1 drop to eye daily as needed (for dry eyes).    [provider]  promethazine (PHENERGAN) 12.5 MG tablet TAKE 1 TABLET BY MOUTH EVERY 12 HOURS AS NEEDED FOR NAUSEA OR VOMITING 06/25/19   Pleas Koch, NP  vitamin B-12 (CYANOCOBALAMIN) 1000 MCG tablet Take 1,000 mcg by mouth daily.     [provider]    Allergies  Allergen Reactions  . Codeine Other (See Comments) and Nausea And Vomiting    nausea  . Colace [Docusate Calcium] Nausea Only    Family History  Problem Relation Age of Onset  . Heart disease Mother 54  . Kidney disease Father   . Diabetes Sister   . Heart disease Brother 49  . Heart attack Son 41  . Cancer Son        Throat  . Stroke Brother   . Cancer Brother   . Cancer Sister        Kidney    Social History Social History   Tobacco Use  . Smoking status: Former Smoker    Packs/day: 0.50    Years: 60.00    Pack years: 30.00    Types: Cigarettes    Quit date: 10/31/2011    Years since quitting: 7.8  .  Smokeless tobacco: Never Used  Substance Use Topics  . Alcohol use: No  . Drug use: No    Review of Systems Constitutional: Negative for fever. Cardiovascular: Negative for chest pain. Respiratory: Negative for shortness of breath. Gastrointestinal: Left flank/left back pain.  No vomiting or diarrhea. Genitourinary: Negative for urinary compaints Musculoskeletal: Possible left hip pain.  States pain shoots down the left leg. Skin: Negative for skin complaints  Neurological: Negative for headache All other ROS negative  ____________________________________________   PHYSICAL EXAM:  VITAL SIGNS: ED Triage Vitals  Enc Vitals Group     BP 09/12/19 1143 125/60     Pulse Rate 09/12/19 1143 82     Resp 09/12/19 1143 16     Temp 09/12/19 1139 98.3 F (36.8 C)     Temp Source 09/12/19 1139 Oral     SpO2 09/12/19 1143 100 %     Weight 09/12/19 1140 150 lb (68 kg)     Height 09/12/19 1140 5\' 2"  (1.575 m)     Head Circumference --      Peak Flow --      Pain Score 09/12/19 1139 9     Pain Loc --      Pain Edu? --      Excl. in [place]? --     Constitutional: Patient is awake and alert extremely poor historian, difficult to obtain an adequate history. Eyes: Normal exam ENT      Head: Normocephalic and atraumatic.       Mouth/Throat: Mucous membranes are moist. Cardiovascular: Normal rate, regular rhythm.  Respiratory: Normal respiratory effort without tachypnea nor retractions. Breath sounds are clear Gastrointestinal: Soft and nontender. No distention.   Musculoskeletal: Nontender with normal range of motion in all extremities. Neurologic:  Normal speech and language. No gross focal neurologic deficits  Skin:  Skin is warm, dry and intact.  Psychiatric: Mood and affect are normal.   ____________________________________________     RADIOLOGY  CT shows multiple acute and subacute pelvic fractures, possible metastatic disease not excluded versus lytic lesions.  Also right lower lobe pneumonia.  ____________________________________________   INITIAL IMPRESSION / ASSESSMENT AND PLAN / ED COURSE  Pertinent labs & imaging results that were available during my care of the patient were reviewed by me and considered in my medical decision making (see chart for details).   Patient presents to the emergency department for what appears to be left flank pain, left hip pain with pain shooting down the left leg.  Patient is extremely poor historian and is not entirely clear why the patient is in the emergency department today.  Her vitals are reassuring.  We will check labs, obtain a CT renal scan, urinalysis.  Patient reports has multiple findings on her CT including multiple acute and subacute pelvic fractures as well as right lower lobe pneumonia.  She also has a urinary tract infection on urinalysis.  We will admit to the hospital service for further treatment.  We will start IV Rocephin and Zithromax to cover for pneumonia and UTI.  Blood cultures and urine cultures will be sent given the elevated white blood cell count of 18,000.  Patient agreeable to plan of care.  Alexzia Kasler Asche was evaluated in Emergency Department on 09/12/2019 for the symptoms described in the history of present illness. She was evaluated in  the context of the global COVID-19 pandemic, which necessitated consideration that the patient might be at risk for infection with the SARS-CoV-2 virus that causes COVID-19. Institutional  protocols and algorithms that pertain to the evaluation of patients at risk for COVID-19 are in a state of rapid change based on information released by regulatory bodies including the CDC and federal and state organizations. These policies and algorithms were followed during the patient's care in the ED.  ____________________________________________   FINAL CLINICAL IMPRESSION(S) / ED DIAGNOSES  Left flank pain/left hip pain Pelvis fractures Right lower lobe pneumonia Urinary tract infection   Harvest Dark, MD 09/12/19 1351

## 2019-09-12 NOTE — ED Notes (Signed)
This RN to pt's room; pt pulled off BP cuff and pulse ox. Reapplied and educated pt. Pt states she is hot and was trying to adjust herself for comfort. Room temp dec for pt.

## 2019-09-12 NOTE — ED Notes (Addendum)
Pt given more warm blankets. Room temp inc per pt request. Given drink.

## 2019-09-12 NOTE — ED Triage Notes (Signed)
Pt arrives via EMS from home with left hip pain- pt has had this pain for 4 week and had an injection in it and it did not help- pt takes tramadol for pain that does not help

## 2019-09-12 NOTE — ED Notes (Signed)
This RN attempted IV access x1 without success

## 2019-09-12 NOTE — ED Notes (Signed)
Pt's POA given update

## 2019-09-12 NOTE — ED Notes (Signed)
Called xray to let them know pt was ready

## 2019-09-12 NOTE — Consult Note (Signed)
ORTHOPAEDIC CONSULTATION  REQUESTING PHYSICIAN: Swayze, Ava, DO  Chief Complaint:   Pelvic and bilateral lower extremity pain  History of Present Illness: Brittany Hughes is a 85 y.o. female with multiple medical problems, including hypertension, hyperlipidemia, diabetes, chronic kidney disease macular degeneration, gastroesophageal reflux disease, seizure disorder, and peripheral neuropathy who lives alone, has noted progressively worsening pain and weakness to her pelvis and both lower extremities, resulting in numerous falls over the past month or so.  She states that she is quite stoic and has tried hard to push through the pain, but has become increasingly unable to do so.  This morning, she finally was unable to get out of bed.  The EMS was called and she was brought to the hospital and admitted for further work-up.  Past Medical History:  Diagnosis Date  . Atrophy of vulva   . Baker's cyst 04/18/2016  . Carotid stenosis   . Chronic kidney disease   . Depression   . Dermatophytosis of nail   . DVT (deep venous thrombosis) ([place]) 01/06/2014   RLE  . Dysuria   . GERD (gastroesophageal reflux disease)   . Hyperglycemia 04/18/2016  . Hyperlipidemia   . Hyperpotassemia   . Hypertension   . Macular degeneration    "left eye; growth behind that" (01/06/2014)  . Mild cognitive impairment, so stated   . Nephrolithiasis   . Osteoporosis, unspecified   . Other B-complex deficiencies   . Other diseases of larynx   . Other voice and resonance disorders   . Reflux esophagitis   . Right wrist fracture 01/06/2014  . Seizures ([place])    "one, years ago" (01/06/2014)  . Unspecified hereditary and idiopathic peripheral neuropathy   . Unspecified malignant neoplasm of skin, site unspecified    left foot  . Unspecified urinary incontinence   . Vitamin deficiency    Past Surgical History:  Procedure Laterality Date  . CAROTID  ENDARTERECTOMY Right 2006  . CATARACT EXTRACTION W/ INTRAOCULAR LENS  IMPLANT, BILATERAL Bilateral 1990's  . DILATION AND CURETTAGE OF UTERUS    . EXCISIONAL HEMORRHOIDECTOMY  1970's?  Marland Kitchen STOMACH SURGERY  ~ 1960   "had ulcers for years; left scar tissue; did OR" (01/06/2014)  . VAGINAL HYSTERECTOMY  ~ 1980   Social History   Socioeconomic History  . Marital status: Widowed    Spouse name: Not on file  . Number of children: Not on file  . Years of education: Not on file  . Highest education level: Not on file  Occupational History  . Not on file  Social Needs  . Financial resource strain: Not on file  . Food insecurity    Worry: Not on file    Inability: Not on file  . Transportation needs    Medical: Not on file    Non-medical: Not on file  Tobacco Use  . Smoking status: Former Smoker    Packs/day: 0.50    Years: 60.00    Pack years: 30.00    Types: Cigarettes    Quit date: 10/31/2011    Years since quitting: 7.8  . Smokeless tobacco: Never Used  Substance and Sexual Activity  . Alcohol use: No  . Drug use: No  . Sexual activity: Never  Lifestyle  . Physical activity    Days per week: Not on file    Minutes per session: Not on file  . Stress: Not on file  Relationships  . Social connections    Talks on phone: Not on  file    Gets together: Not on file    Attends religious service: Not on file    Active member of club or organization: Not on file    Attends meetings of clubs or organizations: Not on file    Relationship status: Not on file  Other Topics Concern  . Not on file  Social History Narrative  . Not on file   Family History  Problem Relation Age of Onset  . Heart disease Mother 19  . Kidney disease Father   . Diabetes Sister   . Heart disease Brother 54  . Heart attack Son 46  . Cancer Son        Throat  . Stroke Brother   . Cancer Brother   . Cancer Sister        Kidney   Allergies  Allergen Reactions  . Codeine Other (See Comments) and  Nausea And Vomiting    nausea  . Colace [Docusate Calcium] Nausea Only   Prior to Admission medications   Medication Sig Start Date End Date Taking? Authorizing Provider  AMITIZA 24 MCG capsule TAKE 1 CAPSULE (24 MCG TOTAL) BY MOUTH 2 (TWO) TIMES DAILY WITH A MEAL. Patient taking differently: Take 24 mcg by mouth 2 (two) times daily with a meal.  03/26/19  Yes Pleas Koch, NP  amitriptyline (ELAVIL) 75 MG tablet Take 1 tablet (75 mg total) by mouth at bedtime. For sleep. 09/03/19  Yes Pleas Koch, NP  antiseptic oral rinse (BIOTENE) LIQD 1 application by Mouth Rinse route every 4 (four) hours as needed for dry mouth.   Yes [provider]  cholecalciferol (VITAMIN D) 1000 units tablet Take 1,000 Units by mouth daily.   Yes [provider]  docusate sodium (COLACE) 100 MG capsule Take 100 mg by mouth daily.   Yes [provider]  gabapentin (NEURONTIN) 300 MG capsule TAKE 1 CAPSULE BY MOUTH EVERYDAY AT BEDTIME Patient taking differently: Take 300 mg by mouth at bedtime.  03/26/19  Yes Pleas Koch, NP  levocetirizine (XYZAL) 5 MG tablet Take 1 tablet (5 mg total) by mouth every evening. For allergies. 08/18/19  Yes Pleas Koch, NP  levothyroxine (SYNTHROID) 25 MCG tablet 1 TAB EVERY MORNING ON AN EMPTY STOMACH WITH FULL GLASS OF WATER. NO FOOD/MEDS FOR 30 MINUTES. Patient taking differently: Take 25 mcg by mouth daily before breakfast.  06/25/19  Yes Pleas Koch, NP  lisinopril (ZESTRIL) 5 MG tablet TAKE 1 TABLET BY MOUTH EVERY DAY Patient taking differently: Take 5 mg by mouth daily.  03/26/19  Yes Pleas Koch, NP  Melatonin 3 MG TABS Take 1 tablet (3 mg total) by mouth daily. 09/29/14  Yes Blanchie Serve, MD  meloxicam (MOBIC) 7.5 MG tablet Take 7.5 mg by mouth daily. 09/10/19  Yes [provider]  metoprolol succinate (TOPROL-XL) 50 MG 24 hr tablet TAKE 1 TABLET BY MOUTH EVERY DAY Patient taking differently: Take 50 mg by  mouth daily.  06/25/19  Yes Pleas Koch, NP  omeprazole (PRILOSEC) 20 MG capsule TAKE 1 CAPSULE (20 MG TOTAL) BY MOUTH DAILY. FOR ACID REFLUX Patient taking differently: Take 20 mg by mouth daily.  12/31/18  Yes Pleas Koch, NP  Polyethyl Glycol-Propyl Glycol (SYSTANE) 0.4-0.3 % SOLN Apply 1 drop to eye daily as needed (for dry eyes).   Yes [provider]  promethazine (PHENERGAN) 12.5 MG tablet TAKE 1 TABLET BY MOUTH EVERY 12 HOURS AS NEEDED FOR NAUSEA OR  VOMITING Patient taking differently: Take 12.5 mg by mouth every 12 (twelve) hours as needed for nausea or vomiting.  06/25/19  Yes Pleas Koch, NP  traMADol (ULTRAM) 50 MG tablet Take 50 mg by mouth every 6 (six) hours as needed for pain. 09/10/19  Yes [provider]  vitamin B-12 (CYANOCOBALAMIN) 1000 MCG tablet Take 1,000 mcg by mouth daily.   Yes [provider]  hydrocortisone (ANUSOL-HC) 25 MG suppository Place 1 suppository (25 mg total) rectally 2 (two) times daily. Patient not taking: Reported on 08/21/2018 06/28/18   Tonia Ghent, MD   Dg Chest 1 View  Result Date: 09/12/2019 CLINICAL DATA:  Peribronchovascular nodularity and consolidation in the right lower lobe on an abdomen pelvis CT earlier today. EXAM: CHEST  1 VIEW COMPARISON:  Abdomen pelvis CT obtained earlier today and chest radiographs dated 03/10/2014. FINDINGS: Mildly enlarged cardiac silhouette with a mild increase in size since 03/20/2014. Patchy opacity and small amount of pleural fluid in the right lower lung zone. Minimal patchy opacity more superiorly in the right lung. Interval oval appearance of the soft tissues within or overlying the right hilum. Clear left lung. Multiple surgical clips in the region of the gastroesophageal junction. Unremarkable bones. IMPRESSION: 1. Mild right lung pneumonia and associated small pleural effusion. 2. Interval oval appearance of the soft tissues within or overlying the right hilum. This  is suspicious for a possible mediastinal, hilar or lung mass. Further evaluation with a chest CT with contrast is recommended. Electronically Signed   By: Claudie Revering M.D.   On: 09/12/2019 15:35   Ct Renal Stone Study  Result Date: 09/12/2019 CLINICAL DATA:  Left pelvic pain for the past month. EXAM: CT ABDOMEN AND PELVIS WITHOUT CONTRAST TECHNIQUE: Multidetector CT imaging of the abdomen and pelvis was performed following the standard protocol without IV contrast. COMPARISON:  CT abdomen pelvis dated December 21, 2015. FINDINGS: Lower chest: Peribronchovascular nodularity and consolidation in the right lower lobe. Trace right pleural effusion. Hepatobiliary: No focal liver abnormality is seen. No gallstones, gallbladder wall thickening, or biliary dilatation. Pancreas: Mild atrophy. No ductal dilatation or surrounding inflammatory changes. Spleen: Normal in size without focal abnormality. Adrenals/Urinary Tract: New indeterminate 1.6 x 1.5 cm left adrenal nodule. The right adrenal gland is unremarkable. 9 mm calculus in the right renal pelvis. No hydronephrosis. Subcentimeter hemorrhagic/proteinaceous left renal cyst again noted. Mild circumferential bladder wall thickening. Stomach/Bowel: Surgical clips near the gastroesophageal junction. Stomach is within normal limits. Appendix appears normal. No evidence of bowel wall thickening, distention, or inflammatory changes. Vascular/Lymphatic: Aortic atherosclerosis. No enlarged abdominal or pelvic lymph nodes. Reproductive: Status post hysterectomy. Mild asymmetric enlargement of the right ovary, increased in size since 2017, with small areas of hypodensity. The left ovary is unremarkable. Other: No abdominal wall hernia or abnormality. No abdominopelvic ascites. No pneumoperitoneum. Musculoskeletal: Acute to subacute fractures of the bilateral sacral ala, S1 and S4 vertebral bodies, right parasymphyseal pubic bone, and left inferior pubic ramus. Cortical  irregularity and indistinctness of the right parasymphyseal pubic bone and left inferior pubic ramus. Cortical irregularity and destruction of the right anterior iliac wing with small adjacent soft tissue mass. Small lytic lesion in the left inferior sacral ala (series 2, image 61). Possible acute to subacute small corner fracture of the L5 anterior inferior endplate. Chronic appearing moderate L1 superior endplate compression deformity, new since 2017. IMPRESSION: 1. Multiple acute to subacute pelvic fractures involving the S1 and S4 vertebral bodies, bilateral sacral ala, right parasymphyseal  pubic bone, and left inferior pubic ramus. While the sacral fractures are likely insufficiency related, bony irregularity involving the right parasymphyseal pubic bone, left inferior pubic ramus, and right anterior iliac wing are concerning for underlying myeloma or metastatic disease. The right anterior iliac wing lesion has a small soft tissue component. Additional small lytic lesion in the left inferior sacral ala. 2. Possible acute to subacute small corner fracture of the L5 anterior inferior endplate. 3. Chronic appearing moderate L1 superior endplate compression deformity, new since 2017. 4. Right lower lobe pneumonia.  Trace right pleural effusion. 5. Mild asymmetric enlargement of the right ovary, increased in size since 2017. Non-emergent outpatient pelvic ultrasound suggested for further evaluation. 6. New indeterminate 1.6 cm left adrenal nodule. In light of the bony pelvis findings, metastatic disease is not excluded. 7. 9 mm calculus in the right renal pelvis.  No hydronephrosis. 8.  Aortic atherosclerosis (ICD10-I70.0). Electronically Signed   By: Titus Dubin M.D.   On: 09/12/2019 12:57    Positive ROS: All other systems have been reviewed and were otherwise negative with the exception of those mentioned in the HPI and as above.  Physical Exam: General:  Alert, no acute distress Psychiatric:  Patient  is competent for consent with normal mood and affect   Cardiovascular:  No pedal edema Respiratory:  No wheezing, non-labored breathing GI:  Abdomen is soft and non-tender Skin:  No lesions in the area of chief complaint Neurologic:  Sensation intact distally Lymphatic:  No axillary or cervical lymphadenopathy  Orthopedic Exam:  Orthopedic examination is limited to the pelvis and both lower extremities.  The patient has mild to moderate tenderness to palpation over the sacral region posteriorly as well as anteriorly along the pubic symphysis.  She also has mild tenderness to palpation over both ASIS regions.  She has increased discomfort with gentle rocking of the pelvis, but has little if any discomfort with gentle logrolling of each lower extremity.  She is able to dorsiflex and plantarflex both the toes and ankles bilaterally, but has difficulty performing an active straight leg raise with either leg due to generalized lower extremity weakness.  Sensation is intact subjectively to light touch over both lower extremities and feet.  She has fair capillary refill to both feet.  X-rays:  I have reviewed the abdominal CT scan performed earlier today.  The findings are as described above.  Assessment: Multiple acute/subacute pelvic fractures, including bilateral sacral alar fractures.  Plan: The CT scan does demonstrate multiple areas of acute and subacute fractures of her pelvis, including both sacral alar regions, some of which are concerning for possible metastatic involvement.  Therefore, I agree with oncology consultation.  In terms of managing her fractures, no formal surgical intervention is necessary at this time.  Therefore, I feel that the patient can be mobilized with physical therapy as symptoms permit.  Most likely, she will require rehab placement due to her profound weakness and deconditioning.  If she is unable to tolerate physical therapy, the patient might benefit from a sacroplasty  to help control her pain.  I do not perform this procedure, but this could be performed by my partner, Dr. Rudene Christians, if she does feel that she is a candidate for this procedure.  He is away this weekend, but perhaps might be available to do this procedure next week if she is in agreement.  Obviously, all of this plan is dependent on the findings and recommendations provided by the oncologist.  Thank you for  asking me to participate in the care of this pleasant yet unfortunate woman.  I will be happy to follow her with you.   Pascal Lux, MD  Beeper #:  [phone]  09/12/2019 7:04 PM

## 2019-09-12 NOTE — ED Notes (Signed)
Patient transported to CT 

## 2019-09-12 NOTE — H&P (Signed)
Brittany Hughes is an 85 y.o. female.   Chief Complaint: Unable to walk. Pt with progressively worsening pain in her groin with walking in the last week. Also shortness of breath and dysuria. HPI: The patient is a 85 yr old woman who lives at home on her home with help from her niece and others among her "people", as she calls them, that help her. The patient has a past medical history significant for DVT of the right lower extremity, GERD, Hyperlipidemia, Hypertension, Mild cognitive impairment, osteoporosis, seizures, Skin cancer of her left foot. She states that she does not drink alcohol, and she quit smoking one year ago.  In the ED the patient is found to have multiple pelvic fractures. Some of these appear to be related to osteopenia. Some of these are suspicious for possible multiple myeloma or metastasis. She is also found to have a right lower lobe pneumonia and UTI. There is also a left adnexal mass.  She denies fever, chills, nausea, vomiting, constipation, or diarrhea. No melena, hematochezia, or coffee ground or hematemesis. She denies chest pain, or sputum production. She states that she has had no difficulty with eating or drinking. She states that she has gained about 10 lbs in the last year.  [facility] hospitalists have been consulted to admit the patient for further evaluation and treatment.  Past Medical History:  Diagnosis Date  . Atrophy of vulva   . Baker's cyst 04/18/2016  . Carotid stenosis   . Chronic kidney disease   . Depression   . Dermatophytosis of nail   . DVT (deep venous thrombosis) ([place]) 01/06/2014   RLE  . Dysuria   . GERD (gastroesophageal reflux disease)   . Hyperglycemia 04/18/2016  . Hyperlipidemia   . Hyperpotassemia   . Hypertension   . Macular degeneration    "left eye; growth behind that" (01/06/2014)  . Mild cognitive impairment, so stated   . Nephrolithiasis   . Osteoporosis, unspecified   . Other B-complex deficiencies   . Other diseases of larynx    . Other voice and resonance disorders   . Reflux esophagitis   . Right wrist fracture 01/06/2014  . Seizures ([place])    "one, years ago" (01/06/2014)  . Unspecified hereditary and idiopathic peripheral neuropathy   . Unspecified malignant neoplasm of skin, site unspecified    left foot  . Unspecified urinary incontinence   . Vitamin deficiency     Past Surgical History:  Procedure Laterality Date  . CAROTID ENDARTERECTOMY Right 2006  . CATARACT EXTRACTION W/ INTRAOCULAR LENS  IMPLANT, BILATERAL Bilateral 1990's  . DILATION AND CURETTAGE OF UTERUS    . EXCISIONAL HEMORRHOIDECTOMY  1970's?  Marland Kitchen STOMACH SURGERY  ~ 1960   "had ulcers for years; left scar tissue; did OR" (01/06/2014)  . VAGINAL HYSTERECTOMY  ~ 1980    Family History  Problem Relation Age of Onset  . Heart disease Mother 79  . Kidney disease Father   . Diabetes Sister   . Heart disease Brother 67  . Heart attack Son 68  . Cancer Son        Throat  . Stroke Brother   . Cancer Brother   . Cancer Sister        Kidney   Social History:  reports that she quit smoking about 7 years ago. Her smoking use included cigarettes. She has a 30.00 pack-year smoking history. She has never used smokeless tobacco. She reports that she does not drink alcohol or use  drugs. (Not in a hospital admission)   Allergies:  Allergies  Allergen Reactions  . Codeine Other (See Comments) and Nausea And Vomiting    nausea  . Colace [Docusate Calcium] Nausea Only    Pertinent items noted in HPI and remainder of comprehensive ROS otherwise negative.   General appearance: alert, cooperative, appears stated age and no distress Head: Normocephalic, without obvious abnormality, atraumatic Eyes: conjunctivae/corneas clear. PERRL, EOM's intact. Fundi benign. Throat: lips, mucosa, and tongue normal; teeth and gums normal Neck: no adenopathy, no carotid bruit, no JVD, supple, symmetrical, trachea midline and thyroid not enlarged, symmetric, no  tenderness/mass/nodules Resp: No increased work of breathing. No wheezes, rales, or rhonchi. No tactile fremitus. Chest wall: no tenderness Cardio: regular rate and rhythm, S1, S2 normal, no murmur, click, rub or gallop GI: soft, non-tender; bowel sounds normal; no masses,  no organomegaly Extremities: extremities normal, atraumatic, no cyanosis or edema Pulses: 2+ and symmetric Skin: Skin color, texture, turgor normal. No rashes or lesions Lymph nodes: Cervical, supraclavicular, and axillary nodes normal. Neurologic: Grossly normal  Results for orders placed or performed during the hospital encounter of 09/12/19 (from the past 48 hour(s))  CBC     Status: Abnormal   Collection Time: 09/12/19 12:02 PM  Result Value Ref Range   WBC 18.0 (H) 4.0 - 10.5 K/uL   RBC 4.04 3.87 - 5.11 MIL/uL   Hemoglobin 10.3 (L) 12.0 - 15.0 g/dL   HCT 32.9 (L) 36.0 - 46.0 %   MCV 81.4 80.0 - 100.0 fL   MCH 25.5 (L) 26.0 - 34.0 pg   MCHC 31.3 30.0 - 36.0 g/dL   RDW 16.8 (H) 11.5 - 15.5 %   Platelets 200 150 - 400 K/uL   nRBC 0.0 0.0 - 0.2 %    Comment: Performed at [facility], [address] [place], [place] [address]  CMP     Status: Abnormal   Collection Time: 09/12/19 12:02 PM  Result Value Ref Range   Sodium 137 135 - 145 mmol/L   Potassium 4.3 3.5 - 5.1 mmol/L   Chloride 102 98 - 111 mmol/L   CO2 25 22 - 32 mmol/L   Glucose, Bld 90 70 - 99 mg/dL   BUN 28 (H) 8 - 23 mg/dL   Creatinine, Ser 1.40 (H) 0.44 - 1.00 mg/dL   Calcium 9.8 8.9 - 10.3 mg/dL   Total Protein 7.0 6.5 - 8.1 g/dL   Albumin 3.3 (L) 3.5 - 5.0 g/dL   AST 19 15 - 41 U/L   ALT 14 0 - 44 U/L   Alkaline Phosphatase 137 (H) 38 - 126 U/L   Total Bilirubin 0.7 0.3 - 1.2 mg/dL   GFR calc non Af Amer 33 (L) >60 mL/min   GFR calc Af Amer 39 (L) >60 mL/min   Anion gap 10 5 - 15    Comment: Performed at [facility], [address] [place], [place] [address]  Urinalysis, Complete w Microscopic     Status:  Abnormal   Collection Time: 09/12/19  1:13 PM  Result Value Ref Range   Color, Urine YELLOW (A) YELLOW   APPearance CLOUDY (A) CLEAR   Specific Gravity, Urine 1.020 1.005 - 1.030   pH 5.0 5.0 - 8.0   Glucose, UA NEGATIVE NEGATIVE mg/dL   Hgb urine dipstick NEGATIVE NEGATIVE   Bilirubin Urine NEGATIVE NEGATIVE   Ketones, ur NEGATIVE NEGATIVE mg/dL   Protein, ur 100 (A) NEGATIVE mg/dL   Nitrite NEGATIVE  NEGATIVE   Leukocytes,Ua LARGE (A) NEGATIVE   RBC / HPF >50 (H) 0 - 5 RBC/hpf   WBC, UA >50 (H) 0 - 5 WBC/hpf   Bacteria, UA RARE (A) NONE SEEN   Squamous Epithelial / LPF 0-5 0 - 5   WBC Clumps PRESENT    Mucus PRESENT     Comment: Performed at [facility], [address] [place], [place] [address]   _0 @  Blood pressure 125/60, pulse 82, temperature 98.3 F (36.8 C), temperature source Oral, resp. rate 16, height 5' 2" (1.575 m), weight 68 kg, SpO2 100 %.    Assessment/Plan Pneumonia: Right lower lobe. Possibly due to aspiration. Will have SLP evaluate swallow. The patient is receiving IV Rocephin and azithromycin at this time.  UTI/pyelonephritis: Pt has left flank pain and dysuria. Urine culture is pending. IV rocephin should be sufficient to cover most pathogens at this time.   Multiple pelvic fractures on CT of the pelvis. It demonstrates: acute to subacute fractures of the bilateral sacral ala, S1 and S4 vertebral bodies, right parasymphyseal pubic bone, and left inferior pubic ramus. Cortical irregularity and indistinctness of the right parasymphyseal pubic bone and left inferior pubic ramus. Cortical irregularity and destruction of the right anterior iliac wing with small adjacent soft tissue mass. Small lytic lesion in the left inferior sacral ala. Possible acute to subacute small corner fracture of the L5 anterior inferior endplate. Chronic appearing moderate L1 superior endplate compression deformity, new since 2017. Right lower lobe pneumonia. Trace  right pleural effusion. Mild asymmetric enlargement of the right ovary, increased in size since 2017. Non-emergent outpatient pelvic ultrasound suggested for further evaluation. New indeterminate 1.6 cm left adrenal nodule. In light of the bony pelvis findings, metastatic disease is not excluded. 9 mm calculus in the right renal pelvis.  No hydronephrosis. Aortic atherosclerosis (ICD10-I70.0). Orthopedic surgery has been consulted from the ED to help with management. SPEP and UPEP have been ordered to investigate possible multiple myeloma. Ca 19-9 has been ordered to investigate possible ovarian cancer. I will discuss with patient and family how aggressive they wish to be in investigating the metastatic potential of some of these abnormalities.  Left hip pain: Pain control. PT/OT with the guidance of orthopedic surgery.  HTN: Continue metoprolol as at home. Monitor.  Peripheral neuropathy: Continue neurontin as at home.  Hypothyroidism: Continue Synthroid as at home.   I have seen and examined this patient myself. I have spent 80 minutes in her evaluation and care.  CODE STATUS: Full Code DVT Prophylaxis: Lovenox Family Communication: None available Disposition: med/surg bed.    09/12/2019, 4:52 PM

## 2019-09-12 NOTE — ED Notes (Signed)
Pt placed on purewick by [place] NT. Confirmed with this RN.

## 2019-09-12 NOTE — ED Notes (Signed)
Pt resting in bed. Rocephin nearly finished.

## 2019-09-12 NOTE — ED Notes (Signed)
Pt assisted onto bedpan to urinate.

## 2019-09-12 NOTE — ED Notes (Signed)
TV turned on for pt. Pt urinated. Peri care provided. Briefs applied.

## 2019-09-13 ENCOUNTER — Other Ambulatory Visit: Payer: Self-pay

## 2019-09-13 ENCOUNTER — Inpatient Hospital Stay: Payer: Medicare Other

## 2019-09-13 DIAGNOSIS — M8440XD Pathological fracture, unspecified site, subsequent encounter for fracture with routine healing: Secondary | ICD-10-CM

## 2019-09-13 DIAGNOSIS — R262 Difficulty in walking, not elsewhere classified: Secondary | ICD-10-CM

## 2019-09-13 DIAGNOSIS — R102 Pelvic and perineal pain: Secondary | ICD-10-CM

## 2019-09-13 DIAGNOSIS — R1900 Intra-abdominal and pelvic swelling, mass and lump, unspecified site: Secondary | ICD-10-CM

## 2019-09-13 LAB — COMPREHENSIVE METABOLIC PANEL
ALT: 11 U/L (ref 0–44)
AST: 14 U/L — ABNORMAL LOW (ref 15–41)
Albumin: 2.8 g/dL — ABNORMAL LOW (ref 3.5–5.0)
Alkaline Phosphatase: 117 U/L (ref 38–126)
Anion gap: 11 (ref 5–15)
BUN: 27 mg/dL — ABNORMAL HIGH (ref 8–23)
CO2: 23 mmol/L (ref 22–32)
Calcium: 9.1 mg/dL (ref 8.9–10.3)
Chloride: 106 mmol/L (ref 98–111)
Creatinine, Ser: 1.05 mg/dL — ABNORMAL HIGH (ref 0.44–1.00)
GFR calc Af Amer: 55 mL/min — ABNORMAL LOW (ref >60)
GFR calc non Af Amer: 47 mL/min — ABNORMAL LOW (ref >60)
Glucose, Bld: 103 mg/dL — ABNORMAL HIGH (ref 70–99)
Potassium: 4.1 mmol/L (ref 3.5–5.1)
Sodium: 140 mmol/L (ref 135–145)
Total Bilirubin: 0.5 mg/dL (ref 0.3–1.2)
Total Protein: 5.9 g/dL — ABNORMAL LOW (ref 6.5–8.1)

## 2019-09-13 LAB — RETIC PANEL
Immature Retic Fract: 24.6 % — ABNORMAL HIGH (ref 2.3–15.9)
RBC.: 3.87 MIL/uL (ref 3.87–5.11)
Retic Count, Absolute: 71.6 10*3/uL (ref 19.0–186.0)
Retic Ct Pct: 1.9 % (ref 0.4–3.1)
Reticulocyte Hemoglobin: 27.8 pg — ABNORMAL LOW (ref >27.9)

## 2019-09-13 LAB — CBC
HCT: 30.3 % — ABNORMAL LOW (ref 36.0–46.0)
Hemoglobin: 9.4 g/dL — ABNORMAL LOW (ref 12.0–15.0)
MCH: 25.7 pg — ABNORMAL LOW (ref 26.0–34.0)
MCHC: 31 g/dL (ref 30.0–36.0)
MCV: 82.8 fL (ref 80.0–100.0)
Platelets: 171 10*3/uL (ref 150–400)
RBC: 3.66 MIL/uL — ABNORMAL LOW (ref 3.87–5.11)
RDW: 16.8 % — ABNORMAL HIGH (ref 11.5–15.5)
WBC: 12.7 10*3/uL — ABNORMAL HIGH (ref 4.0–10.5)
nRBC: 0 % (ref 0.0–0.2)

## 2019-09-13 MED ORDER — HYDROCODONE-ACETAMINOPHEN 10-325 MG PO TABS
1.0000 | ORAL_TABLET | ORAL | Status: DC | PRN
  Administered 2019-09-13: 1 via ORAL
  Administered 2019-09-14 – 2019-09-19 (×15): 2 via ORAL
  Filled 2019-09-13 (×7): qty 2
  Filled 2019-09-13: qty 1
  Filled 2019-09-13 (×10): qty 2

## 2019-09-13 MED ORDER — ENOXAPARIN SODIUM 40 MG/0.4ML ~~LOC~~ SOLN
40.0000 mg | SUBCUTANEOUS | Status: DC
  Administered 2019-09-13 – 2019-09-14 (×2): 40 mg via SUBCUTANEOUS
  Filled 2019-09-13 (×2): qty 0.4

## 2019-09-13 MED ORDER — POLYETHYLENE GLYCOL 3350 17 G PO PACK
17.0000 g | PACK | Freq: Every day | ORAL | Status: DC
  Administered 2019-09-13 – 2019-09-18 (×4): 17 g via ORAL
  Filled 2019-09-13 (×4): qty 1

## 2019-09-13 NOTE — ED Notes (Signed)
Report to Seth Bake RN in case of move to [facility].

## 2019-09-13 NOTE — Consult Note (Signed)
Hematology/Oncology Consult note [facility] [phone] [phone]  Patient Care Team: Pleas Koch, NP as PCP - General (Internal Medicine)   Name of the patient: Brittany Hughes  [id number]  1928/07/12   Date of visit: 09/13/19 REASON FOR COSULTATION:  Suspect metastatic disease History of presenting illness-  85 y.o. female with PMH listed at below who presents to ER for evaluation of left-sided pain for about a month. Patient is a very poor historian.  History of was obtained from medical chart reviewing and from patient's niece Con Memos via phone conversation 09/12/2019 CT renal stone study showed multiple acute to subacute pelvic fractures involving the S1 and S4 vertebral bodies, bilateral sacral alla, right parasymphyseal pubic bone, left inferior pubic ramus.  Concerning for underlying myeloma or metastatic disease.  The right anterior iliac wing lesion has a small soft tissue component.  Additional small lytic lesions in the left inferior sacral ala. Possible acute to subacute small corner fracture of the L5.  Chronic appearing moderate L1 compression deformity.  Mild asymmetric enlargement of the right ovary increasing size. New indeterminate 1.6 cm left adrenal nodule.  9 mm calculus right renal pelvis.  No hydronephrosis.  Aortic sclerosis  Per niece, patient lives by herself.  She has been quite sufficient, she does not have any children.  Nieces help her.  Patient has baseline mild cognitive impairment.      Review of Systems  Unable to perform ROS: Dementia  HENT:   Positive for hearing loss.   Eyes:       Chronic vision impairment  Musculoskeletal:       Hip pain    Allergies  Allergen Reactions  . Codeine Other (See Comments) and Nausea And Vomiting    nausea  . Colace [Docusate Calcium] Nausea Only    Patient Active Problem List   Diagnosis Date Noted  . Pneumonia 09/12/2019  . Hemorrhoids 02/15/2018  .  Allergic rhinitis 02/15/2018  . Urinary incontinence 10/03/2017  . Abnormality of gait 06/20/2016  . Chronic constipation 04/18/2016  . Hyperglycemia 04/18/2016  . Anemia 04/18/2016  . Hypothyroidism 04/18/2016  . IBS (irritable bowel syndrome) 09/29/2014  . Essential hypertension 09/29/2014  . Neuropathy 09/29/2014  . Insomnia 09/29/2014  . Leg edema, left 04/07/2014  . Dementia, vascular, with depression ([place]) 03/11/2014  . GERD (gastroesophageal reflux disease) 01/06/2014  . Frequent falls 01/06/2014  . Hyperlipidemia with target LDL less than 100 11/12/2013  . Arthritis of left knee 11/12/2013  . Atrophy of vulva 02/18/2013  . Malunion and nonunion of fracture(733.8) 02/18/2013  . Vitamin D deficiency 02/18/2013  . Chronic kidney disease, stage III (moderate) 02/18/2013  . Other B-complex deficiencies 02/18/2013  . Tobacco use disorder 02/18/2013  . Other diseases of vocal cords 02/18/2013  . Anxiety and depression 02/18/2013  . Chronic pain syndrome 02/18/2013  . Deafness or hearing loss of type classifiable to 389.0 with type classifiable to 389.1 02/18/2013  . Occlusion and stenosis of carotid artery without mention of cerebral infarction 02/18/2013     Past Medical History:  Diagnosis Date  . Atrophy of vulva   . Baker's cyst 04/18/2016  . Carotid stenosis   . Chronic kidney disease   . Depression   . Dermatophytosis of nail   . DVT (deep venous thrombosis) ([place]) 01/06/2014   RLE  . Dysuria   . GERD (gastroesophageal reflux disease)   . Hyperglycemia 04/18/2016  . Hyperlipidemia   . Hyperpotassemia   . Hypertension   .  Macular degeneration    "left eye; growth behind that" (01/06/2014)  . Mild cognitive impairment, so stated   . Nephrolithiasis   . Osteoporosis, unspecified   . Other B-complex deficiencies   . Other diseases of larynx   . Other voice and resonance disorders   . Reflux esophagitis   . Right wrist fracture 01/06/2014  . Seizures ([place])    "one,  years ago" (01/06/2014)  . Unspecified hereditary and idiopathic peripheral neuropathy   . Unspecified malignant neoplasm of skin, site unspecified    left foot  . Unspecified urinary incontinence   . Vitamin deficiency      Past Surgical History:  Procedure Laterality Date  . CAROTID ENDARTERECTOMY Right 2006  . CATARACT EXTRACTION W/ INTRAOCULAR LENS  IMPLANT, BILATERAL Bilateral 1990's  . DILATION AND CURETTAGE OF UTERUS    . EXCISIONAL HEMORRHOIDECTOMY  1970's?  Marland Kitchen STOMACH SURGERY  ~ 1960   "had ulcers for years; left scar tissue; did OR" (01/06/2014)  . VAGINAL HYSTERECTOMY  ~ 1980    Social History   Socioeconomic History  . Marital status: Widowed    Spouse name: Not on file  . Number of children: Not on file  . Years of education: Not on file  . Highest education level: Not on file  Occupational History  . Not on file  Social Needs  . Financial resource strain: Not on file  . Food insecurity    Worry: Not on file    Inability: Not on file  . Transportation needs    Medical: Not on file    Non-medical: Not on file  Tobacco Use  . Smoking status: Former Smoker    Packs/day: 0.50    Years: 60.00    Pack years: 30.00    Types: Cigarettes    Quit date: 10/31/2011    Years since quitting: 7.8  . Smokeless tobacco: Never Used  Substance and Sexual Activity  . Alcohol use: No  . Drug use: No  . Sexual activity: Never  Lifestyle  . Physical activity    Days per week: Not on file    Minutes per session: Not on file  . Stress: Not on file  Relationships  . Social Herbalist on phone: Not on file    Gets together: Not on file    Attends religious service: Not on file    Active member of club or organization: Not on file    Attends meetings of clubs or organizations: Not on file    Relationship status: Not on file  . Intimate partner violence    Fear of current or ex partner: Not on file    Emotionally abused: Not on file    Physically abused: Not on  file    Forced sexual activity: Not on file  Other Topics Concern  . Not on file  Social History Narrative  . Not on file     Family History  Problem Relation Age of Onset  . Heart disease Mother 62  . Kidney disease Father   . Diabetes Sister   . Heart disease Brother 60  . Heart attack Son 77  . Cancer Son        Throat  . Stroke Brother   . Cancer Brother   . Cancer Sister        Kidney     Current Facility-Administered Medications:  .  acetaminophen (TYLENOL) tablet 650 mg, 650 mg, Oral, Q6H PRN **OR** acetaminophen (TYLENOL) suppository  650 mg, 650 mg, Rectal, Q6H PRN, Swayze, Ava, DO .  amitriptyline (ELAVIL) tablet 75 mg, 75 mg, Oral, QHS, Swayze, Ava, DO, 75 mg at 09/12/19 2142 .  azithromycin (ZITHROMAX) 500 mg in sodium chloride 0.9 % 250 mL IVPB, 500 mg, Intravenous, Q24H, Swayze, Ava, DO .  bisacodyl (DULCOLAX) suppository 10 mg, 10 mg, Rectal, Daily PRN, Swayze, Ava, DO .  cefTRIAXone (ROCEPHIN) 1 g in sodium chloride 0.9 % 100 mL IVPB, 1 g, Intravenous, Q24H, Swayze, Ava, DO .  docusate sodium (COLACE) capsule 100 mg, 100 mg, Oral, Daily, Swayze, Ava, DO .  enoxaparin (LOVENOX) injection 40 mg, 40 mg, Subcutaneous, Q24H, Hallaji, Sheema M, RPH .  gabapentin (NEURONTIN) capsule 300 mg, 300 mg, Oral, QHS, Swayze, Ava, DO, 300 mg at 09/12/19 2142 .  HYDROcodone-acetaminophen (NORCO) 10-325 MG per tablet 1-2 tablet, 1-2 tablet, Oral, Q4H PRN, Swayze, Ava, DO .  levothyroxine (SYNTHROID) tablet 25 mcg, 25 mcg, Oral, Q0600, Swayze, Ava, DO, 25 mcg at 09/13/19 0556 .  Melatonin TABS 5 mg, 5 mg, Oral, Daily, Swayze, Ava, DO .  metoprolol succinate (TOPROL-XL) 24 hr tablet 50 mg, 50 mg, Oral, Daily, Swayze, Ava, DO, 50 mg at 09/13/19 1005 .  ondansetron (ZOFRAN) tablet 4 mg, 4 mg, Oral, Q6H PRN **OR** ondansetron (ZOFRAN) injection 4 mg, 4 mg, Intravenous, Q6H PRN, Swayze, Ava, DO .  pantoprazole (PROTONIX) EC tablet 40 mg, 40 mg, Oral, Daily, Swayze, Ava, DO, 40 mg at  09/13/19 1004 .  polyethylene glycol (MIRALAX / GLYCOLAX) packet 17 g, 17 g, Oral, Daily, Swayze, Ava, DO, 17 g at 09/13/19 1006 .  promethazine (PHENERGAN) tablet 12.5 mg, 12.5 mg, Oral, Q12H PRN, Swayze, Ava, DO .  senna-docusate (Senokot-S) tablet 1 tablet, 1 tablet, Oral, QHS PRN, Swayze, Ava, DO .  vitamin B-12 (CYANOCOBALAMIN) tablet 1,000 mcg, 1,000 mcg, Oral, Daily, Swayze, Ava, DO, 1,000 mcg at 09/13/19 1004   Physical exam: ECOG  Vitals:   09/13/19 0130 09/13/19 0143 09/13/19 0308 09/13/19 0333  BP: (!) 123/53 (!) 115/54 109/74 (!) 167/70  Pulse: 76 71 79 76  Resp:  _0 Temp:  98.4 F (36.9 C)  (!) 97.5 F (36.4 C)  TempSrc:  Oral  Oral  SpO2:  92% 96% 95%  Weight:      Height:       Physical Exam  Constitutional: No distress.  HENT:  Head: Normocephalic and atraumatic.  Eyes: Pupils are equal, round, and reactive to light. No scleral icterus.  Neck: Normal range of motion. Neck supple.  Pulmonary/Chest: Effort normal. No respiratory distress. She has no wheezes.  Abdominal: Soft.  Musculoskeletal:        General: No edema.  Neurological: She is alert.  Orientated to person.  Skin: Skin is warm and dry.  Psychiatric: Affect normal.        CMP Latest Ref Rng & Units 09/13/2019  Glucose 70 - 99 mg/dL 103(H)  BUN 8 - 23 mg/dL 27(H)  Creatinine 0.44 - 1.00 mg/dL 1.05(H)  Sodium 135 - 145 mmol/L 140  Potassium 3.5 - 5.1 mmol/L 4.1  Chloride 98 - 111 mmol/L 106  CO2 22 - 32 mmol/L 23  Calcium 8.9 - 10.3 mg/dL 9.1  Total Protein 6.5 - 8.1 g/dL 5.9(L)  Total Bilirubin 0.3 - 1.2 mg/dL 0.5  Alkaline Phos 38 - 126 U/L 117  AST 15 - 41 U/L 14(L)  ALT 0 - 44 U/L 11   CBC Latest Ref Rng & Units 09/13/2019  WBC 4.0 - 10.5 K/uL 12.7(H)  Hemoglobin 12.0 - 15.0 g/dL 9.4(L)  Hematocrit 36.0 - 46.0 % 30.3(L)  Platelets 150 - 400 K/uL 171   RADIOGRAPHIC STUDIES: I have personally reviewed the radiological images as listed and agreed with the findings in the  report.   Dg Chest 1 View  Result Date: 09/12/2019 CLINICAL DATA:  Peribronchovascular nodularity and consolidation in the right lower lobe on an abdomen pelvis CT earlier today. EXAM: CHEST  1 VIEW COMPARISON:  Abdomen pelvis CT obtained earlier today and chest radiographs dated 03/10/2014. FINDINGS: Mildly enlarged cardiac silhouette with a mild increase in size since 03/20/2014. Patchy opacity and small amount of pleural fluid in the right lower lung zone. Minimal patchy opacity more superiorly in the right lung. Interval oval appearance of the soft tissues within or overlying the right hilum. Clear left lung. Multiple surgical clips in the region of the gastroesophageal junction. Unremarkable bones. IMPRESSION: 1. Mild right lung pneumonia and associated small pleural effusion. 2. Interval oval appearance of the soft tissues within or overlying the right hilum. This is suspicious for a possible mediastinal, hilar or lung mass. Further evaluation with a chest CT with contrast is recommended. Electronically Signed   By: Claudie Revering M.D.   On: 09/12/2019 15:35   Ct Renal Stone Study  Result Date: 09/12/2019 CLINICAL DATA:  Left pelvic pain for the past month. EXAM: CT ABDOMEN AND PELVIS WITHOUT CONTRAST TECHNIQUE: Multidetector CT imaging of the abdomen and pelvis was performed following the standard protocol without IV contrast. COMPARISON:  CT abdomen pelvis dated December 21, 2015. FINDINGS: Lower chest: Peribronchovascular nodularity and consolidation in the right lower lobe. Trace right pleural effusion. Hepatobiliary: No focal liver abnormality is seen. No gallstones, gallbladder wall thickening, or biliary dilatation. Pancreas: Mild atrophy. No ductal dilatation or surrounding inflammatory changes. Spleen: Normal in size without focal abnormality. Adrenals/Urinary Tract: New indeterminate 1.6 x 1.5 cm left adrenal nodule. The right adrenal gland is unremarkable. 9 mm calculus in the right renal  pelvis. No hydronephrosis. Subcentimeter hemorrhagic/proteinaceous left renal cyst again noted. Mild circumferential bladder wall thickening. Stomach/Bowel: Surgical clips near the gastroesophageal junction. Stomach is within normal limits. Appendix appears normal. No evidence of bowel wall thickening, distention, or inflammatory changes. Vascular/Lymphatic: Aortic atherosclerosis. No enlarged abdominal or pelvic lymph nodes. Reproductive: Status post hysterectomy. Mild asymmetric enlargement of the right ovary, increased in size since 2017, with small areas of hypodensity. The left ovary is unremarkable. Other: No abdominal wall hernia or abnormality. No abdominopelvic ascites. No pneumoperitoneum. Musculoskeletal: Acute to subacute fractures of the bilateral sacral ala, S1 and S4 vertebral bodies, right parasymphyseal pubic bone, and left inferior pubic ramus. Cortical irregularity and indistinctness of the right parasymphyseal pubic bone and left inferior pubic ramus. Cortical irregularity and destruction of the right anterior iliac wing with small adjacent soft tissue mass. Small lytic lesion in the left inferior sacral ala (series 2, image 61). Possible acute to subacute small corner fracture of the L5 anterior inferior endplate. Chronic appearing moderate L1 superior endplate compression deformity, new since 2017. IMPRESSION: 1. Multiple acute to subacute pelvic fractures involving the S1 and S4 vertebral bodies, bilateral sacral ala, right parasymphyseal pubic bone, and left inferior pubic ramus. While the sacral fractures are likely insufficiency related, bony irregularity involving the right parasymphyseal pubic bone, left inferior pubic ramus, and right anterior iliac wing are concerning for underlying myeloma or metastatic disease. The right anterior iliac wing lesion has a small soft tissue component. Additional  small lytic lesion in the left inferior sacral ala. 2. Possible acute to subacute small  corner fracture of the L5 anterior inferior endplate. 3. Chronic appearing moderate L1 superior endplate compression deformity, new since 2017. 4. Right lower lobe pneumonia.  Trace right pleural effusion. 5. Mild asymmetric enlargement of the right ovary, increased in size since 2017. Non-emergent outpatient pelvic ultrasound suggested for further evaluation. 6. New indeterminate 1.6 cm left adrenal nodule. In light of the bony pelvis findings, metastatic disease is not excluded. 7. 9 mm calculus in the right renal pelvis.  No hydronephrosis. 8.  Aortic atherosclerosis (ICD10-I70.0). Electronically Signed   By: Titus Dubin M.D.   On: 09/12/2019 12:57    Assessment and plan- Patient is a 85 y.o. female presented for evaluation of left side pain.   #Abnormal CT, images were independently reviewed by me CT findings concerning for multiple acute and subacute pelvic fractures, vertebral compression fractures.  Enlarged right ovary and indeterminate left adrenal nodule. Patient also had CKD, chronic anemia, normocytic.  Corrected calcium normal. AKI on CKD on admission, creatinine improved after hydration.  Check multiple myeloma, light chain ratio, UPEP, reticulocyte panel check CA-125 I had lengthy discussion with patient's niece Con Memos.  Possible bone marrow biopsy discussed with her.  Family is willing to proceed with procedures necessary.  She also preferred work-up to be done during this admission.  I will wait on multiple myeloma work-up first. Recommend pelvic ultrasound for further evaluation of enlarged ovary.  Former smoker, with indeterminate adrenal lesion.  Chest x-ray was independently viewed by me.  She has mild right lung pneumonia with associated small pleural effusion.  Interval soft tissue within or overlying right hilum.  Questionable mediastinal/hilar/lung mass.  I would obtain a CT chest for further evaluation-given her recent AKI on CKD, possible multiple myeloma undergoing work-up,  recommend to obtain CT chest without contrast for now.  Communicated with Dr.Swayze via secure chat.   Thank you for allowing me to participate in the care of this patient.  Total face to face encounter time for this patient visit was 70 min. >50% of the time was  spent in counseling and coordination of care.    Earlie Server, MD, PhD Hematology Oncology [facility] Pager- [phone] 09/13/2019

## 2019-09-13 NOTE — ED Notes (Signed)
Pt sleeping. 

## 2019-09-13 NOTE — TOC Initial Note (Signed)
Transition of Care [facility]) - Initial/Assessment Note    Patient Details  Name: Brittany Hughes MRN: [id number] Date of Birth: [birth year]  [facility] of [facility]) CM/SW Contact:    Latanya Maudlin, RN Phone Number: 09/13/2019, 11:34 AM  Clinical Narrative:  [facility] team consulted for home health needs. Patient lives alone in an apartment. She uses a rolling walker at home. She is mostly independent at baseline. However, she does report that she has struggled to function independently around the last month or so. Patient feels home health would be beneficial. CMS Medicare.gov Compare Post Acute Care list reviewed with patient and she has no preference in agency. Heads up to [facility] care. MD to place orders prior to discharge.                 Expected Discharge Plan: [place] Barriers to Discharge: Continued Medical Work up   Patient Goals and CMS Choice   CMS Medicare.gov Compare Post Acute Care list provided to:: Patient Choice offered to / list presented to : Patient  Expected Discharge Plan and Services Expected Discharge Plan: [place]   Discharge Planning Services: CM Consult Post Acute Care Choice: [place] arrangements for the past 2 months: Apartment                           HH Arranged: RN, PT, Nurse's Aide [place] Agency: [place] [facility] ([place]) Date HH Agency Contacted: 09/13/19 Time [place]: 10 Representative spoke with at [place]: Corene Cornea  Prior Living Arrangements/Services Living arrangements for the past 2 months: Apartment Lives with:: Self Patient language and need for interpreter reviewed:: No Do you feel safe going back to the place where you live?: Yes      Need for Family Participation in Patient Care: No (Comment)   Current home services: DME Criminal Activity/Legal Involvement Pertinent to Current Situation/Hospitalization: No - Comment as needed  Activities of Daily Living Home  Assistive Devices/Equipment: Environmental consultant (specify type), Shower chair with back ADL Screening (condition at time of admission) Patient's cognitive ability adequate to safely complete daily activities?: Yes Is the patient deaf or have difficulty hearing?: No Does the patient have difficulty seeing, even when wearing glasses/contacts?: No Does the patient have difficulty concentrating, remembering, or making decisions?: No Patient able to express need for assistance with ADLs?: No Does the patient have difficulty dressing or bathing?: No Independently performs ADLs?: Yes (appropriate for developmental age) Does the patient have difficulty walking or climbing stairs?: Yes Weakness of Legs: Both Weakness of Arms/Hands: Both  Permission Sought/Granted                  Emotional Assessment Appearance:: Appears stated age Attitude/Demeanor/Rapport: Engaged, Gracious Affect (typically observed): Stable Orientation: : Oriented to Self, Oriented to Place, Oriented to  Time, Oriented to Situation      Admission diagnosis:  Left hip pain [M25.552] Multiple closed fractures of pelvis, initial encounter ([place]) [S32.82XA] Urinary tract infection with hematuria, site unspecified [N39.0, R31.9] Community acquired pneumonia of right lower lobe of lung [J18.9] Patient Active Problem List   Diagnosis Date Noted  . Pneumonia 09/12/2019  . Hemorrhoids 02/15/2018  . Allergic rhinitis 02/15/2018  . Urinary incontinence 10/03/2017  . Abnormality of gait 06/20/2016  . Chronic constipation 04/18/2016  . Hyperglycemia 04/18/2016  . Anemia 04/18/2016  . Hypothyroidism 04/18/2016  . IBS (irritable bowel syndrome) 09/29/2014  . Essential  hypertension 09/29/2014  . Neuropathy 09/29/2014  . Insomnia 09/29/2014  . Leg edema, left 04/07/2014  . Dementia, vascular, with depression ([place]) 03/11/2014  . GERD (gastroesophageal reflux disease) 01/06/2014  . Frequent falls 01/06/2014  . Hyperlipidemia with  target LDL less than 100 11/12/2013  . Arthritis of left knee 11/12/2013  . Atrophy of vulva 02/18/2013  . Malunion and nonunion of fracture(733.8) 02/18/2013  . Vitamin D deficiency 02/18/2013  . Chronic kidney disease, stage III (moderate) 02/18/2013  . Other B-complex deficiencies 02/18/2013  . Tobacco use disorder 02/18/2013  . Other diseases of vocal cords 02/18/2013  . Anxiety and depression 02/18/2013  . Chronic pain syndrome 02/18/2013  . Deafness or hearing loss of type classifiable to 389.0 with type classifiable to 389.1 02/18/2013  . Occlusion and stenosis of carotid artery without mention of cerebral infarction 02/18/2013   PCP:  Pleas Koch, NP Pharmacy:   [id number] - [place], [address] [place] [address] Phone: [phone] Fax: [phone]  [id number] [place], [address] [place] [address] Phone: [phone] Fax: [phone]     Social Determinants of Health (SDOH) Interventions    Readmission Risk Interventions Readmission Risk Prevention Plan 09/13/2019  Post Dischage Appt Complete  Medication Screening Complete  Transportation Screening Complete  Some recent data might be hidden

## 2019-09-13 NOTE — Progress Notes (Signed)
PROGRESS NOTE  Brittany Hughes:[id number] DOB: 19-Jun-1928 DOA: 09/12/2019 PCP: Doreene Nest, NP  Brief History   Unable to walk. Pt with progressively worsening pain in her groin with walking in the last week. Also shortness of breath and dysuria.  The patient is a 85 yr old woman who lives at home on her home with help from her niece and others among her "people", as she calls them, that help her. The patient has a past medical history significant for DVT of the right lower extremity, GERD, Hyperlipidemia, Hypertension, Mild cognitive impairment, osteoporosis, seizures, Skin cancer of her left foot. She states that she does not drink alcohol, and she quit smoking one year ago.  In the ED the patient is found to have multiple pelvic fractures. Some of these appear to be related to osteopenia. Some of these are suspicious for possible multiple myeloma or metastasis. She is also found to have a right lower lobe pneumonia and UTI. There is also a left pelvic mass and a right lower lobe mass.  The patient has been admitted to a medical bed. This patient is receiving IV antibiotics. She is receiving pain control. Consults have been requested for orthopedic surgery and oncology. She continues to have considerable pain with trying to move about in bed.  Consultants  . Orthopedic surgery . Oncology  Procedures  . None  Antibiotics   Anti-infectives (From admission, onward)   Start     Dose/Rate Route Frequency Ordered Stop   09/13/19 1700  azithromycin (ZITHROMAX) 500 mg in sodium chloride 0.9 % 250 mL IVPB     500 mg 250 mL/hr over 60 Minutes Intravenous Every 24 hours 09/12/19 1412     09/13/19 1600  cefTRIAXone (ROCEPHIN) 1 g in sodium chloride 0.9 % 100 mL IVPB     1 g 200 mL/hr over 30 Minutes Intravenous Every 24 hours 09/12/19 1412     09/12/19 1330  azithromycin (ZITHROMAX) 500 mg in sodium chloride 0.9 % 250 mL IVPB     500 mg 250 mL/hr over 60 Minutes Intravenous  Once  09/12/19 1315 09/12/19 1752   09/12/19 1330  cefTRIAXone (ROCEPHIN) 1 g in sodium chloride 0.9 % 100 mL IVPB     1 g 200 mL/hr over 30 Minutes Intravenous  Once 09/12/19 1315 09/12/19 1649    .  Subjective  The patient is comfortably in bed until nursing works to get her situated in bed. She cries out in pain with horizontal movement of her pelvis. She seems comfortable without moving.   Objective   Vitals:  Vitals:   09/13/19 0308 09/13/19 0333  BP: 109/74 (!) 167/70  Pulse: 79 76  Resp: 16 20  Temp:  (!) 97.5 F (36.4 C)  SpO2: 96% 95%   Exam:  Constitutional:  . The patient is awake, alert, and oriented x 3. Acute distress with attempting to move about in bed. She is comfortable if still.  Respiratory:  . No increased work of breathing. . No wheezes, rales, or rhonchi . No tactile fremitus Cardiovascular:  . Regular rate and rhythm . No murmurs, ectopy, or gallups. . No lateral PMI. No thrills. Abdomen:  . Abdomen is soft, non-tender, non-distended . No hernias, masses, or organomegaly . Normoactive bowel sounds.  Musculoskeletal:  . No cyanosis, clubbing, or edema Skin:  . No rashes, lesions, ulcers . palpation of skin: no induration or nodules Neurologic:  . CN 2-12 intact . Sensation all 4 extremities intact Psychiatric:  .  Mental status o Mood, affect appropriate o Orientation to person, place, time  . judgment and insight appear intact  I have personally reviewed the following:   Today's Data  . Vitals, CBC, CMP  Scheduled Meds: . amitriptyline  75 mg Oral QHS  . docusate sodium  100 mg Oral Daily  . enoxaparin (LOVENOX) injection  40 mg Subcutaneous Q24H  . gabapentin  300 mg Oral QHS  . levothyroxine  25 mcg Oral Q0600  . Melatonin  5 mg Oral Daily  . metoprolol succinate  50 mg Oral Daily  . pantoprazole  40 mg Oral Daily  . polyethylene glycol  17 g Oral Daily  . vitamin B-12  1,000 mcg Oral Daily   Continuous Infusions: . azithromycin     . cefTRIAXone (ROCEPHIN)  IV      Active Problems:   Pneumonia   LOS: 1 day   A & P   Pneumonia: Right lower lobe. Possibly due to aspiration. Will have SLP evaluate swallow. The patient is receiving IV Rocephin and azithromycin at this time.  UTI/pyelonephritis: Pt has left flank pain and dysuria. Urine culture is pending. IV rocephin should be sufficient to cover most pathogens at this time.   Multiple pelvic fractures on CT of the pelvis. It demonstrates: acute to subacute fractures of the bilateral sacral ala, S1 and S4 vertebral bodies, right parasymphyseal pubic bone, and left inferior pubic ramus. Cortical irregularity and indistinctness of the right parasymphyseal pubic bone and left inferior pubic ramus. Cortical irregularity and destruction of the right anterior iliac wing with small adjacent soft tissue mass. Small lytic lesion in the left inferior sacral ala. Possible acute to subacute small corner fracture of the L5 anterior inferior endplate. Chronic appearing moderate L1 superior endplate compression deformity, new since 2017. Right lower lobe pneumonia. Trace right pleural effusion. Mild asymmetric enlargement of the right ovary, increased in size since 2017. Non-emergent outpatient pelvic ultrasound suggested for further evaluation. New indeterminate 1.6 cm left adrenal nodule. In light of the bony pelvis findings, metastatic disease is not excluded. 9 mm calculus in the right renal pelvis. No hydronephrosis.Aortic atherosclerosis (ICD10-I70.0). Orthopedic surgery has been consulted from the ED to help with management. SPEP and UPEP have been ordered to investigate possible multiple myeloma. Ca 19-9 has been ordered to investigate possible ovarian cancer. I will discuss with patient and family how aggressive they wish to be in investigating the metastatic potential of some of these abnormalities.  Left hip pain: Pain control. PT/OT with the guidance of orthopedic surgery.   HTN: Continue metoprolol as at home. Monitor.  Peripheral neuropathy: Continue neurontin as at home.  Hypothyroidism: Continue Synthroid as at home.   I have seen and examined this patient myself. I have spent 80 minutes in her evaluation and care.  CODE STATUS: Full Code DVT Prophylaxis: Lovenox Family Communication: I have discussed this patient with her niece this morning. All questions answered to the best of my ability. Disposition: med/surg bed.  Braley Luckenbaugh, DO [facility] Direct contact: see [email or web address]  7PM-7AM contact night coverage as above 09/13/2019, 12:03 PM  LOS: 1 day         s

## 2019-09-13 NOTE — Progress Notes (Signed)
PHARMACIST - PHYSICIAN COMMUNICATION  CONCERNING:  Enoxaparin (Lovenox) for DVT Prophylaxis   RECOMMENDATION: Patient was prescribed enoxaprin 30mg  q24 hours for VTE prophylaxis.   Filed Weights   09/12/19 1140  Weight: 150 lb (68 kg)    Body mass index is 27.44 kg/m.  Estimated Creatinine Clearance: 32.9 mL/min (A) (by C-G formula based on SCr of 1.05 mg/dL (H)).  Scr and CrCl have improved. Based on [place] patient is candidate for enoxaparin 40mg  every 24 hours based on CrCl >61ml/min and Weight >45kg   DESCRIPTION: Pharmacy has adjusted enoxaparin dose per [facility] policy.  Patient is now receiving enoxaparin 40mg  every 24hours.   Pernell Dupre, PharmD, BCPS Clinical Pharmacist 09/13/2019 8:46 AM

## 2019-09-13 NOTE — ED Notes (Signed)
Pt called out to have this RN silence monitor. Silenced. Lights dimmed for pt. TV adjusted for pt. Called bell within reach. Bed locked low. Rail up. Pt denies any needs.

## 2019-09-13 NOTE — Progress Notes (Signed)
Paged Ava Swayze re: Harrel Lemon, POA, would like to speak with her regarding plan of care. Also, updated the phone contact info for Harrel Lemon per her information.

## 2019-09-13 NOTE — ED Notes (Signed)
Pt moved to [facility] room 30. Pt tolerated well. Pt alert and easily aroused from sleep. No distress noted at this time. States she is having very little pain. Lights off to enhance rest. Provided for safety and comfort and will continue to assess.

## 2019-09-13 NOTE — Plan of Care (Signed)
Patient is alert and oriented but is a high fall risk due to multiple falls in the home. Placed PT/OT evaluations. Case management was consulted due to need for home health services.

## 2019-09-14 DIAGNOSIS — M899 Disorder of bone, unspecified: Secondary | ICD-10-CM

## 2019-09-14 DIAGNOSIS — R590 Localized enlarged lymph nodes: Secondary | ICD-10-CM

## 2019-09-14 DIAGNOSIS — M25552 Pain in left hip: Secondary | ICD-10-CM

## 2019-09-14 DIAGNOSIS — Z86718 Personal history of other venous thrombosis and embolism: Secondary | ICD-10-CM

## 2019-09-14 DIAGNOSIS — E875 Hyperkalemia: Secondary | ICD-10-CM

## 2019-09-14 DIAGNOSIS — R739 Hyperglycemia, unspecified: Secondary | ICD-10-CM

## 2019-09-14 DIAGNOSIS — E785 Hyperlipidemia, unspecified: Secondary | ICD-10-CM

## 2019-09-14 DIAGNOSIS — I10 Essential (primary) hypertension: Secondary | ICD-10-CM

## 2019-09-14 DIAGNOSIS — Z79899 Other long term (current) drug therapy: Secondary | ICD-10-CM

## 2019-09-14 DIAGNOSIS — R918 Other nonspecific abnormal finding of lung field: Secondary | ICD-10-CM

## 2019-09-14 DIAGNOSIS — S3282XA Multiple fractures of pelvis without disruption of pelvic ring, initial encounter for closed fracture: Secondary | ICD-10-CM

## 2019-09-14 DIAGNOSIS — R32 Unspecified urinary incontinence: Secondary | ICD-10-CM

## 2019-09-14 DIAGNOSIS — Q5039 Other congenital malformation of ovary: Secondary | ICD-10-CM

## 2019-09-14 DIAGNOSIS — H3530 Unspecified macular degeneration: Secondary | ICD-10-CM

## 2019-09-14 DIAGNOSIS — K219 Gastro-esophageal reflux disease without esophagitis: Secondary | ICD-10-CM

## 2019-09-14 DIAGNOSIS — M810 Age-related osteoporosis without current pathological fracture: Secondary | ICD-10-CM

## 2019-09-14 DIAGNOSIS — R569 Unspecified convulsions: Secondary | ICD-10-CM

## 2019-09-14 DIAGNOSIS — S32810D Multiple fractures of pelvis with stable disruption of pelvic ring, subsequent encounter for fracture with routine healing: Secondary | ICD-10-CM

## 2019-09-14 LAB — COMPREHENSIVE METABOLIC PANEL
ALT: 10 U/L (ref 0–44)
AST: 15 U/L (ref 15–41)
Albumin: 2.8 g/dL — ABNORMAL LOW (ref 3.5–5.0)
Alkaline Phosphatase: 126 U/L (ref 38–126)
Anion gap: 11 (ref 5–15)
BUN: 22 mg/dL (ref 8–23)
CO2: 23 mmol/L (ref 22–32)
Calcium: 9.1 mg/dL (ref 8.9–10.3)
Chloride: 104 mmol/L (ref 98–111)
Creatinine, Ser: 0.92 mg/dL (ref 0.44–1.00)
GFR calc Af Amer: >60 mL/min (ref >60)
GFR calc non Af Amer: 55 mL/min — ABNORMAL LOW (ref >60)
Glucose, Bld: 112 mg/dL — ABNORMAL HIGH (ref 70–99)
Potassium: 3.9 mmol/L (ref 3.5–5.1)
Sodium: 138 mmol/L (ref 135–145)
Total Bilirubin: 0.7 mg/dL (ref 0.3–1.2)
Total Protein: 6.5 g/dL (ref 6.5–8.1)

## 2019-09-14 LAB — CBC WITH DIFFERENTIAL/PLATELET
Abs Immature Granulocytes: 0.14 10*3/uL — ABNORMAL HIGH (ref 0.00–0.07)
Basophils Absolute: 0 10*3/uL (ref 0.0–0.1)
Basophils Relative: 0 %
Eosinophils Absolute: 0.1 10*3/uL (ref 0.0–0.5)
Eosinophils Relative: 1 %
HCT: 31.1 % — ABNORMAL LOW (ref 36.0–46.0)
Hemoglobin: 9.8 g/dL — ABNORMAL LOW (ref 12.0–15.0)
Immature Granulocytes: 1 %
Lymphocytes Relative: 9 %
Lymphs Abs: 1.1 10*3/uL (ref 0.7–4.0)
MCH: 25.3 pg — ABNORMAL LOW (ref 26.0–34.0)
MCHC: 31.5 g/dL (ref 30.0–36.0)
MCV: 80.2 fL (ref 80.0–100.0)
Monocytes Absolute: 0.8 10*3/uL (ref 0.1–1.0)
Monocytes Relative: 6 %
Neutro Abs: 10.3 10*3/uL — ABNORMAL HIGH (ref 1.7–7.7)
Neutrophils Relative %: 83 %
Platelets: 198 10*3/uL (ref 150–400)
RBC: 3.88 MIL/uL (ref 3.87–5.11)
RDW: 16.5 % — ABNORMAL HIGH (ref 11.5–15.5)
WBC: 12.4 10*3/uL — ABNORMAL HIGH (ref 4.0–10.5)
nRBC: 0 % (ref 0.0–0.2)

## 2019-09-14 LAB — URINE CULTURE: Culture: >=100000 — AB

## 2019-09-14 LAB — CBC
HCT: 29.6 % — ABNORMAL LOW (ref 36.0–46.0)
Hemoglobin: 9.3 g/dL — ABNORMAL LOW (ref 12.0–15.0)
MCH: 25.1 pg — ABNORMAL LOW (ref 26.0–34.0)
MCHC: 31.4 g/dL (ref 30.0–36.0)
MCV: 80 fL (ref 80.0–100.0)
Platelets: 197 10*3/uL (ref 150–400)
RBC: 3.7 MIL/uL — ABNORMAL LOW (ref 3.87–5.11)
RDW: 16.4 % — ABNORMAL HIGH (ref 11.5–15.5)
WBC: 11.8 10*3/uL — ABNORMAL HIGH (ref 4.0–10.5)
nRBC: 0 % (ref 0.0–0.2)

## 2019-09-14 LAB — CA 125: Cancer Antigen (CA) 125: 28.5 U/mL (ref 0.0–38.1)

## 2019-09-14 MED ORDER — AMOXICILLIN-POT CLAVULANATE 500-125 MG PO TABS: 1.0000 | ORAL_TABLET | Freq: Three times a day (TID) | ORAL | Status: DC

## 2019-09-14 MED ORDER — SODIUM CHLORIDE 0.9 % IV SOLN
1.0000 g | INTRAVENOUS | Status: DC
  Administered 2019-09-14 – 2019-09-18 (×5): 1 g via INTRAVENOUS
  Filled 2019-09-14 (×2): qty 10
  Filled 2019-09-14 (×4): qty 1
  Filled 2019-09-14: qty 10
  Filled 2019-09-14: qty 1

## 2019-09-14 NOTE — Progress Notes (Signed)
Subjective: No new complaints.  The patient still has pain in her pelvic region with attempted weightbearing, but is comfortable at rest.   Objective: Vital signs in last 24 hours: Temp:  [98.3 F (36.8 C)-98.8 F (37.1 C)] 98.4 F (36.9 C) (11/08 0449) Pulse Rate:  [73-92] 92 (11/08 0449) Resp:  [20] 20 (11/08 0449) BP: (140-151)/(52-69) 144/69 (11/08 0449) SpO2:  [93 %] 93 % (11/08 0449)  Intake/Output from previous day: 11/07 0701 - 11/08 0700 In: 101 [IV Piggyback:101] Out: 400 [Urine:400] Intake/Output this shift: No intake/output data recorded.  Recent Labs    09/12/19 1202 09/13/19 0527 09/14/19 0635  HGB 10.3* 9.4* 9.8*   Recent Labs    09/13/19 0527 09/13/19 1329 09/14/19 0635  WBC 12.7*  --  12.4*  RBC 3.66* 3.87 3.88  HCT 30.3*  --  31.1*  PLT 171  --  198   Recent Labs    09/13/19 0527 09/14/19 0635  NA 140 138  K 4.1 3.9  CL 106 104  CO2 23 23  BUN 27* 22  CREATININE 1.05* 0.92  GLUCOSE 103* 112*  CALCIUM 9.1 9.1   No results for input(s): LABPT, INR in the last 72 hours.  Physical Exam: Orthopedic examination is unchanged as compared to her admission examination on 09/12/2019.  She remains neurovascularly intact to both lower extremities.  Assessment: Multiple pelvic insufficiency fractures with possible underlying metastatic disease.  Plan: The treatment options have been reviewed with the patient.  I have reviewed the oncologist's recommendations and agree with his imaging and laboratory work-up recommendations to evaluate for multiple myeloma.  The patient may be mobilized with physical therapy as symptoms permit during this work-up, and may weight-bear as tolerated with a walker for balance and support.  She may receive appropriate pain medication as needed.   Marshall Cork Poggi 09/14/2019, 1:02 PM

## 2019-09-14 NOTE — Progress Notes (Signed)
PROGRESS NOTE  Brittany Hughes ION:[id number] DOB: 1928-04-11 DOA: 09/12/2019 PCP: Doreene Nest, NP  Brief History   Unable to walk. Pt with progressively worsening pain in her groin with walking in the last week. Also shortness of breath and dysuria.  The patient is a 85 yr old woman who lives at home on her home with help from her niece and others among her "people", as she calls them, that help her. The patient has a past medical history significant for DVT of the right lower extremity, GERD, Hyperlipidemia, Hypertension, Mild cognitive impairment, osteoporosis, seizures, Skin cancer of her left foot. She states that she does not drink alcohol, and she quit smoking one year ago.  In the ED the patient is found to have multiple pelvic fractures. Some of these appear to be related to osteopenia. Some of these are suspicious for possible multiple myeloma or metastasis. She is also found to have a right lower lobe pneumonia and UTI. There is also a left pelvic mass and a right lower lobe mass.  The patient has been admitted to a medical bed. This patient is receiving IV antibiotics. She is receiving pain control. Consults have been requested for orthopedic surgery and oncology. I appreciate their help.  Consultants  . Orthopedic surgery . Oncology  Procedures  . None  Antibiotics   Anti-infectives (From admission, onward)   Start     Dose/Rate Route Frequency Ordered Stop   09/13/19 1700  azithromycin (ZITHROMAX) 500 mg in sodium chloride 0.9 % 250 mL IVPB     500 mg 250 mL/hr over 60 Minutes Intravenous Every 24 hours 09/12/19 1412     09/13/19 1600  cefTRIAXone (ROCEPHIN) 1 g in sodium chloride 0.9 % 100 mL IVPB     1 g 200 mL/hr over 30 Minutes Intravenous Every 24 hours 09/12/19 1412     09/12/19 1330  azithromycin (ZITHROMAX) 500 mg in sodium chloride 0.9 % 250 mL IVPB     500 mg 250 mL/hr over 60 Minutes Intravenous  Once 09/12/19 1315 09/12/19 1752   09/12/19 1330   cefTRIAXone (ROCEPHIN) 1 g in sodium chloride 0.9 % 100 mL IVPB     1 g 200 mL/hr over 30 Minutes Intravenous  Once 09/12/19 1315 09/12/19 1649     Subjective  The patient is resting comfortably in bed. No new complaints.  Objective   Vitals:  Vitals:   09/13/19 2044 09/14/19 0449  BP: (!) 151/66 (!) 144/69  Pulse: 80 92  Resp: 20 20  Temp: 98.8 F (37.1 C) 98.4 F (36.9 C)  SpO2: 93% 93%   Exam:  Constitutional:  The patient is sleeping soundly. She is easily rousable. No acute distress. Respiratory:  . No increased work of breathing. . No wheezes, rales, or rhonchi . No tactile fremitus Cardiovascular:  . Regular rate and rhythm . No murmurs, ectopy, or gallups. . No lateral PMI. No thrills. Abdomen:  . Abdomen is soft, non-tender, non-distended . No hernias, masses, or organomegaly . Normoactive bowel sounds.  Musculoskeletal:  . No cyanosis, clubbing, or edema Skin:  . No rashes, lesions, ulcers . palpation of skin: no induration or nodules Neurologic:  . CN 2-12 intact . Sensation all 4 extremities intact Psychiatric:  . Mental status o Mood, affect appropriate o Orientation to person, place, time  . judgment and insight appear intact  I have personally reviewed the following:   Today's Data  . Vitals, CBC, CMP  Scheduled Meds: . amitriptyline  75 mg Oral QHS  . docusate sodium  100 mg Oral Daily  . enoxaparin (LOVENOX) injection  40 mg Subcutaneous Q24H  . gabapentin  300 mg Oral QHS  . levothyroxine  25 mcg Oral Q0600  . Melatonin  5 mg Oral Daily  . metoprolol succinate  50 mg Oral Daily  . pantoprazole  40 mg Oral Daily  . polyethylene glycol  17 g Oral Daily  . vitamin B-12  1,000 mcg Oral Daily   Continuous Infusions: . azithromycin 500 mg (09/13/19 1816)  . cefTRIAXone (ROCEPHIN)  IV 1 g (09/13/19 1725)    Active Problems:   Pneumonia   LOS: 2 days   A & P   Pneumonia: Right lower lobe. Possibly due to aspiration. Will have  SLP evaluate swallow. The patient is receiving IV Rocephin and azithromycin at this time.  Right infra-hilar mass: Will consult pulmonology as it appears that this will be amenable to biopsy via bronchoscopy.   UTI/pyelonephritis: Pt has left flank pain and dysuria. Urine culture has grown out over 100K conolis of aerococcus urinae. IV rocephin should be sufficient to cover most pathogens at this time.   Multiple pelvic fractures on CT of the pelvis. It demonstrates: acute to subacute fractures of the bilateral sacral ala, S1 and S4 vertebral bodies, right parasymphyseal pubic bone, and left inferior pubic ramus. Cortical irregularity and indistinctness of the right parasymphyseal pubic bone and left inferior pubic ramus. Cortical irregularity and destruction of the right anterior iliac wing with small adjacent soft tissue mass. Small lytic lesion in the left inferior sacral ala. Possible acute to subacute small corner fracture of the L5 anterior inferior endplate. Chronic appearing moderate L1 superior endplate compression deformity, new since 2017. Right lower lobe pneumonia. Trace right pleural effusion. Mild asymmetric enlargement of the right ovary, increased in size since 2017. Non-emergent outpatient pelvic ultrasound suggested for further evaluation. New indeterminate 1.6 cm left adrenal nodule. In light of the bony pelvis findings, metastatic disease is not excluded. 9 mm calculus in the right renal pelvis. No hydronephrosis.Aortic atherosclerosis (ICD10-I70.0). Orthopedic surgery has been consulted from the ED to help with management. SPEP and UPEP have been ordered to investigate possible multiple myeloma. Ca 19-9 has been ordered to investigate possible ovarian cancer. I will discuss with patient and family how aggressive they wish to be in investigating the metastatic potential of some of these abnormalities.  Left hip pain: Pain control. PT/OT with the guidance of orthopedic surgery.   HTN: Continue metoprolol as at home. Monitor.  Peripheral neuropathy: Continue neurontin as at home.  Hypothyroidism: Continue Synthroid as at home.   I have seen and examined this patient myself. I have spent 32 minutes in her evaluation and care.  CODE STATUS: Full Code DVT Prophylaxis: Lovenox Family Communication: I have discussed this patient with her niece this morning. All questions answered to the best of my ability. Disposition: med/surg bed.  Etheline Geppert, DO [facility] Direct contact: see [email or web address]  7PM-7AM contact night coverage as above 09/14/2019, 11:37 PM  LOS: 1 day         s

## 2019-09-14 NOTE — Progress Notes (Signed)
Hematology/Oncology Progress Note [facility] [phone] [phone]  Patient Care Team: Pleas Koch, NP as PCP - General (Internal Medicine)   Name of the patient: Brittany Hughes  [id number]  [birth year]  Date of visit: 09/14/19   INTERVAL HISTORY-  Worse hip pain. +SOB.     Review of systems- Review of Systems  Unable to perform ROS: Dementia  Constitutional: Positive for fatigue.  HENT:   Positive for hearing loss.   Respiratory: Positive for shortness of breath.   Musculoskeletal:       Pelvis pain    Allergies  Allergen Reactions   Codeine Other (See Comments) and Nausea And Vomiting    nausea   Colace [Docusate Calcium] Nausea Only    Patient Active Problem List   Diagnosis Date Noted   Pneumonia 09/12/2019   Hemorrhoids 02/15/2018   Allergic rhinitis 02/15/2018   Urinary incontinence 10/03/2017   Abnormality of gait 06/20/2016   Chronic constipation 04/18/2016   Hyperglycemia 04/18/2016   Anemia 04/18/2016   Hypothyroidism 04/18/2016   IBS (irritable bowel syndrome) 09/29/2014   Essential hypertension 09/29/2014   Neuropathy 09/29/2014   Insomnia 09/29/2014   Leg edema, left 04/07/2014   Dementia, vascular, with depression ([place]) 03/11/2014   GERD (gastroesophageal reflux disease) 01/06/2014   Frequent falls 01/06/2014   Hyperlipidemia with target LDL less than 100 11/12/2013   Arthritis of left knee 11/12/2013   Atrophy of vulva 02/18/2013   Malunion and nonunion of fracture(733.8) 02/18/2013   Vitamin D deficiency 02/18/2013   Chronic kidney disease, stage III (moderate) 02/18/2013   Other B-complex deficiencies 02/18/2013   Tobacco use disorder 02/18/2013   Other diseases of vocal cords 02/18/2013   Anxiety and depression 02/18/2013   Chronic pain syndrome 02/18/2013   Deafness or hearing loss of type classifiable to 389.0 with type classifiable to 389.1 02/18/2013     Occlusion and stenosis of carotid artery without mention of cerebral infarction 02/18/2013     Past Medical History:  Diagnosis Date   Atrophy of vulva    Baker's cyst 04/18/2016   Carotid stenosis    Chronic kidney disease    Depression    Dermatophytosis of nail    DVT (deep venous thrombosis) ([place]) 01/06/2014   RLE   Dysuria    GERD (gastroesophageal reflux disease)    Hyperglycemia 04/18/2016   Hyperlipidemia    Hyperpotassemia    Hypertension    Macular degeneration    "left eye; growth behind that" (01/06/2014)   Mild cognitive impairment, so stated    Nephrolithiasis    Osteoporosis, unspecified    Other B-complex deficiencies    Other diseases of larynx    Other voice and resonance disorders    Reflux esophagitis    Right wrist fracture 01/06/2014   Seizures ([place])    "one, years ago" (01/06/2014)   Unspecified hereditary and idiopathic peripheral neuropathy    Unspecified malignant neoplasm of skin, site unspecified    left foot   Unspecified urinary incontinence    Vitamin deficiency      Past Surgical History:  Procedure Laterality Date   CAROTID ENDARTERECTOMY Right 2006   CATARACT EXTRACTION W/ INTRAOCULAR LENS  IMPLANT, BILATERAL Bilateral 1990's   DILATION AND CURETTAGE OF UTERUS     EXCISIONAL HEMORRHOIDECTOMY  1970's?   STOMACH SURGERY  ~ 1960   "had ulcers for years; left scar tissue; did OR" (01/06/2014)   VAGINAL HYSTERECTOMY  ~ 1980    Social History  Socioeconomic History   Marital status: Widowed    Spouse name: Not on file   Number of children: Not on file   Years of education: Not on file   Highest education level: Not on file  Occupational History   Not on file  Social Needs   Financial resource strain: Not on file   Food insecurity    Worry: Not on file    Inability: Not on file   Transportation needs    Medical: Not on file    Non-medical: Not on file  Tobacco Use   Smoking status:  Former Smoker    Packs/day: 0.50    Years: 60.00    Pack years: 30.00    Types: Cigarettes    Quit date: 10/31/2011    Years since quitting: 7.8   Smokeless tobacco: Never Used  Substance and Sexual Activity   Alcohol use: No   Drug use: No   Sexual activity: Never  Lifestyle   Physical activity    Days per week: Not on file    Minutes per session: Not on file   Stress: Not on file  Relationships   Social connections    Talks on phone: Not on file    Gets together: Not on file    Attends religious service: Not on file    Active member of club or organization: Not on file    Attends meetings of clubs or organizations: Not on file    Relationship status: Not on file   Intimate partner violence    Fear of current or ex partner: Not on file    Emotionally abused: Not on file    Physically abused: Not on file    Forced sexual activity: Not on file  Other Topics Concern   Not on file  Social History Narrative   Not on file     Family History  Problem Relation Age of Onset   Heart disease Mother 15   Kidney disease Father    Diabetes Sister    Heart disease Brother 59   Heart attack Son 60   Cancer Son        Throat   Stroke Brother    Cancer Brother    Cancer Sister        Kidney     Current Facility-Administered Medications:    acetaminophen (TYLENOL) tablet 650 mg, 650 mg, Oral, Q6H PRN **OR** acetaminophen (TYLENOL) suppository 650 mg, 650 mg, Rectal, Q6H PRN, Swayze, Ava, DO   amitriptyline (ELAVIL) tablet 75 mg, 75 mg, Oral, QHS, Swayze, Ava, DO, 75 mg at 09/13/19 2325   azithromycin (ZITHROMAX) 500 mg in sodium chloride 0.9 % 250 mL IVPB, 500 mg, Intravenous, Q24H, Swayze, Ava, DO, Last Rate: 250 mL/hr at 09/13/19 1816, 500 mg at 09/13/19 1816   bisacodyl (DULCOLAX) suppository 10 mg, 10 mg, Rectal, Daily PRN, Swayze, Ava, DO   cefTRIAXone (ROCEPHIN) 1 g in sodium chloride 0.9 % 100 mL IVPB, 1 g, Intravenous, Q24H, Swayze, Ava, DO, Last  Rate: 200 mL/hr at 09/13/19 1725, 1 g at 09/13/19 1725   docusate sodium (COLACE) capsule 100 mg, 100 mg, Oral, Daily, Swayze, Ava, DO, 100 mg at 09/14/19 0822   enoxaparin (LOVENOX) injection 40 mg, 40 mg, Subcutaneous, Q24H, Hallaji, Sheema M, RPH, 40 mg at 09/13/19 2325   gabapentin (NEURONTIN) capsule 300 mg, 300 mg, Oral, QHS, Swayze, Ava, DO, 300 mg at 09/13/19 2325   HYDROcodone-acetaminophen (NORCO) 10-325 MG per tablet 1-2 tablet, 1-2 tablet, Oral,  Q4H PRN, Swayze, Ava, DO, 2 tablet at 09/14/19 0821   levothyroxine (SYNTHROID) tablet 25 mcg, 25 mcg, Oral, Q0600, Swayze, Ava, DO, 25 mcg at 09/14/19 0538   Melatonin TABS 5 mg, 5 mg, Oral, Daily, Swayze, Ava, DO, 5 mg at 09/14/19 0016   metoprolol succinate (TOPROL-XL) 24 hr tablet 50 mg, 50 mg, Oral, Daily, Swayze, Ava, DO, 50 mg at 09/14/19 0822   ondansetron (ZOFRAN) tablet 4 mg, 4 mg, Oral, Q6H PRN **OR** ondansetron (ZOFRAN) injection 4 mg, 4 mg, Intravenous, Q6H PRN, Swayze, Ava, DO   pantoprazole (PROTONIX) EC tablet 40 mg, 40 mg, Oral, Daily, Swayze, Ava, DO, 40 mg at 09/14/19 8250   polyethylene glycol (MIRALAX / GLYCOLAX) packet 17 g, 17 g, Oral, Daily, Swayze, Ava, DO, 17 g at 09/13/19 1006   promethazine (PHENERGAN) tablet 12.5 mg, 12.5 mg, Oral, Q12H PRN, Swayze, Ava, DO   senna-docusate (Senokot-S) tablet 1 tablet, 1 tablet, Oral, QHS PRN, Swayze, Ava, DO   vitamin B-12 (CYANOCOBALAMIN) tablet 1,000 mcg, 1,000 mcg, Oral, Daily, Swayze, Ava, DO, 1,000 mcg at 09/14/19 0821   Physical exam:  Vitals:   09/13/19 0333 09/13/19 1420 09/13/19 2044 09/14/19 0449  BP: (!) 167/70 (!) 140/52 (!) 151/66 (!) 144/69  Pulse: 76 73 80 92  Resp: 20  20 20   Temp: (!) 97.5 F (36.4 C) 98.3 F (36.8 C) 98.8 F (37.1 C) 98.4 F (36.9 C)  TempSrc: Oral Oral Oral Oral  SpO2: 95% 93% 93% 93%  Weight:      Height:       Physical Exam  Constitutional: No distress.  HENT:  Head: Normocephalic and atraumatic.  Eyes: Pupils are  equal, round, and reactive to light. No scleral icterus.  Neck: Normal range of motion. Neck supple.  Pulmonary/Chest: Effort normal. No respiratory distress. She has no wheezes.  Abdominal: Soft.  Musculoskeletal:        General: No edema.  Neurological: She is alert.  Orientated to person.  Skin: Skin is warm and dry.  Psychiatric: Affect normal.    CMP Latest Ref Rng & Units 09/14/2019  Glucose 70 - 99 mg/dL 112(H)  BUN 8 - 23 mg/dL 22  Creatinine 0.44 - 1.00 mg/dL 0.92  Sodium 135 - 145 mmol/L 138  Potassium 3.5 - 5.1 mmol/L 3.9  Chloride 98 - 111 mmol/L 104  CO2 22 - 32 mmol/L 23  Calcium 8.9 - 10.3 mg/dL 9.1  Total Protein 6.5 - 8.1 g/dL 6.5  Total Bilirubin 0.3 - 1.2 mg/dL 0.7  Alkaline Phos 38 - 126 U/L 126  AST 15 - 41 U/L 15  ALT 0 - 44 U/L 10   CBC Latest Ref Rng & Units 09/14/2019  WBC 4.0 - 10.5 K/uL 12.4(H)  Hemoglobin 12.0 - 15.0 g/dL 9.8(L)  Hematocrit 36.0 - 46.0 % 31.1(L)  Platelets 150 - 400 K/uL 198    RADIOGRAPHIC STUDIES: I have personally reviewed the radiological images as listed and agreed with the findings in the report.   Dg Chest 1 View  Result Date: 09/12/2019 CLINICAL DATA:  Peribronchovascular nodularity and consolidation in the right lower lobe on an abdomen pelvis CT earlier today. EXAM: CHEST  1 VIEW COMPARISON:  Abdomen pelvis CT obtained earlier today and chest radiographs dated 03/10/2014. FINDINGS: Mildly enlarged cardiac silhouette with a mild increase in size since 03/20/2014. Patchy opacity and small amount of pleural fluid in the right lower lung zone. Minimal patchy opacity more superiorly in the right lung. Interval oval appearance  of the soft tissues within or overlying the right hilum. Clear left lung. Multiple surgical clips in the region of the gastroesophageal junction. Unremarkable bones. IMPRESSION: 1. Mild right lung pneumonia and associated small pleural effusion. 2. Interval oval appearance of the soft tissues within or  overlying the right hilum. This is suspicious for a possible mediastinal, hilar or lung mass. Further evaluation with a chest CT with contrast is recommended. Electronically Signed   By: Claudie Revering M.D.   On: 09/12/2019 15:35   Ct Chest Wo Contrast  Result Date: 09/13/2019 CLINICAL DATA:  Abnormal chest radiograph EXAM: CT CHEST WITHOUT CONTRAST TECHNIQUE: Multidetector CT imaging of the chest was performed following the standard protocol without IV contrast. COMPARISON:  Chest radiograph dated 09/12/2019. CT abdomen/pelvis dated 09/12/2019. FINDINGS: Cardiovascular: Heart is normal in size.  No pericardial effusion. No evidence of thoracic aortic aneurysm. Atherosclerotic calcifications of the aortic arch. Three vessel coronary atherosclerosis. Mediastinum/Nodes: Dominant 7.8 x 4.9 cm right perihilar mass extending to the mediastinum (series 2/image 68), suspicious for primary bronchogenic neoplasm. Associated 1.8 cm subcarinal node (series 2/image 61) and bulky paratracheal nodes measuring up to 2.7 cm (series 2/image 45). Visualized thyroid is unremarkable. Lungs/Pleura: Right infrahilar masslike opacity measuring up to 4.6 x 6.2 cm (series 3/image 91), suspicious for primary bronchogenic neoplasm. Associated postobstructive right lower lobe opacity/atelectasis, infection/pneumonia not excluded. Moderate right pleural effusion. Ground-glass opacity in the right upper and middle lobes. Left lung is clear. No pneumothorax. Upper Abdomen: Unchanged from recent CT, including a 17 mm left adrenal nodule, favoring a metastasis. Musculoskeletal: No focal osseous lesions. IMPRESSION: 6.2 cm right infrahilar masslike opacity, suspicious for primary bronchogenic neoplasm. 7.8 cm right perihilar masslike opacity extending to the mediastinum, likely reflecting associated nodal metastases. Additional mediastinal nodal metastases, including a bulky paratracheal node, as above. Associated postobstructive opacities in the  right lung. Moderate right pleural effusion. 17 mm left adrenal nodule, favoring a metastasis. Aortic Atherosclerosis (ICD10-I70.0). Electronically Signed   By: Julian Hy M.D.   On: 09/13/2019 16:02   US Pelvis (transabdominal Only)  Result Date: 09/13/2019 CLINICAL DATA:  Enlarged right ovary on CT EXAM: TRANSABDOMINAL ULTRASOUND OF PELVIS TECHNIQUE: Transabdominal ultrasound examination of the pelvis was performed including evaluation of the uterus, ovaries, adnexal regions, and pelvic cul-de-sac. COMPARISON:  None. FINDINGS: Uterus Surgically absent. Right ovary Measurements: 3.0 x 2.1 x 3.3 cm = volume: 11 mL. Poorly visualized but grossly unremarkable. Left ovary Not discretely visualized.  No adnexal mass is seen. Other findings:  No abnormal free fluid. IMPRESSION: Right ovary is poorly visualized but grossly unremarkable on ultrasound. Left ovary is not discretely visualized but normal on recent CT. Status post hysterectomy. Electronically Signed   By: Julian Hy M.D.   On: 09/13/2019 16:05   Ct Renal Stone Study  Result Date: 09/12/2019 CLINICAL DATA:  Left pelvic pain for the past month. EXAM: CT ABDOMEN AND PELVIS WITHOUT CONTRAST TECHNIQUE: Multidetector CT imaging of the abdomen and pelvis was performed following the standard protocol without IV contrast. COMPARISON:  CT abdomen pelvis dated December 21, 2015. FINDINGS: Lower chest: Peribronchovascular nodularity and consolidation in the right lower lobe. Trace right pleural effusion. Hepatobiliary: No focal liver abnormality is seen. No gallstones, gallbladder wall thickening, or biliary dilatation. Pancreas: Mild atrophy. No ductal dilatation or surrounding inflammatory changes. Spleen: Normal in size without focal abnormality. Adrenals/Urinary Tract: New indeterminate 1.6 x 1.5 cm left adrenal nodule. The right adrenal gland is unremarkable. 9 mm calculus in the right renal  pelvis. No hydronephrosis. Subcentimeter  hemorrhagic/proteinaceous left renal cyst again noted. Mild circumferential bladder wall thickening. Stomach/Bowel: Surgical clips near the gastroesophageal junction. Stomach is within normal limits. Appendix appears normal. No evidence of bowel wall thickening, distention, or inflammatory changes. Vascular/Lymphatic: Aortic atherosclerosis. No enlarged abdominal or pelvic lymph nodes. Reproductive: Status post hysterectomy. Mild asymmetric enlargement of the right ovary, increased in size since 2017, with small areas of hypodensity. The left ovary is unremarkable. Other: No abdominal wall hernia or abnormality. No abdominopelvic ascites. No pneumoperitoneum. Musculoskeletal: Acute to subacute fractures of the bilateral sacral ala, S1 and S4 vertebral bodies, right parasymphyseal pubic bone, and left inferior pubic ramus. Cortical irregularity and indistinctness of the right parasymphyseal pubic bone and left inferior pubic ramus. Cortical irregularity and destruction of the right anterior iliac wing with small adjacent soft tissue mass. Small lytic lesion in the left inferior sacral ala (series 2, image 61). Possible acute to subacute small corner fracture of the L5 anterior inferior endplate. Chronic appearing moderate L1 superior endplate compression deformity, new since 2017. IMPRESSION: 1. Multiple acute to subacute pelvic fractures involving the S1 and S4 vertebral bodies, bilateral sacral ala, right parasymphyseal pubic bone, and left inferior pubic ramus. While the sacral fractures are likely insufficiency related, bony irregularity involving the right parasymphyseal pubic bone, left inferior pubic ramus, and right anterior iliac wing are concerning for underlying myeloma or metastatic disease. The right anterior iliac wing lesion has a small soft tissue component. Additional small lytic lesion in the left inferior sacral ala. 2. Possible acute to subacute small corner fracture of the L5 anterior inferior  endplate. 3. Chronic appearing moderate L1 superior endplate compression deformity, new since 2017. 4. Right lower lobe pneumonia.  Trace right pleural effusion. 5. Mild asymmetric enlargement of the right ovary, increased in size since 2017. Non-emergent outpatient pelvic ultrasound suggested for further evaluation. 6. New indeterminate 1.6 cm left adrenal nodule. In light of the bony pelvis findings, metastatic disease is not excluded. 7. 9 mm calculus in the right renal pelvis.  No hydronephrosis. 8.  Aortic atherosclerosis (ICD10-I70.0). Electronically Signed   By: Titus Dubin M.D.   On: 09/12/2019 12:57    Assessment and plan-  Patient is a 85 y.o. female presented for evaluation of left side pain.    #Abnormal CT, images were independently reviewed by me CT findings concerning for multiple acute and subacute pelvic fractures, vertebral compression fractures.  Enlarged right ovary and indeterminate left adrenal nodule. I obtained CT chest wo, which showed bulky right infrahilar mass, as well as bulky nodal metastasis.  Adrenal nodule is likely metastatic disease.   US pelvis right ovary is grossly unremarkable. CA125 is normal.  MM work up is pending.   Per my discussion with POA Terri, suspect lung cancer. Given her age and performance status, medical problems, she will not be a chemotherapy candidate. Immunotherapy can be considered after tissue confirmation.  family is interested in proceeding necessary diagnostic work up to obtain tissue diagnosis.  Pulmonology will be consulted for evaluation of feasibility and risk of bronchoscopy.  Communicated with Dr.Swayze via secure chat.   Thank you for allowing me to participate in the care of this patient.   Earlie Server, MD, PhD Hematology Oncology [facility] at [facility] Pager- [phone] 09/14/2019

## 2019-09-15 ENCOUNTER — Other Ambulatory Visit: Payer: Self-pay | Admitting: Primary Care

## 2019-09-15 DIAGNOSIS — G629 Polyneuropathy, unspecified: Secondary | ICD-10-CM

## 2019-09-15 DIAGNOSIS — S3282XA Multiple fractures of pelvis without disruption of pelvic ring, initial encounter for closed fracture: Secondary | ICD-10-CM

## 2019-09-15 DIAGNOSIS — J449 Chronic obstructive pulmonary disease, unspecified: Secondary | ICD-10-CM

## 2019-09-15 DIAGNOSIS — M899 Disorder of bone, unspecified: Secondary | ICD-10-CM

## 2019-09-15 DIAGNOSIS — R918 Other nonspecific abnormal finding of lung field: Secondary | ICD-10-CM

## 2019-09-15 DIAGNOSIS — M25552 Pain in left hip: Secondary | ICD-10-CM

## 2019-09-15 DIAGNOSIS — K5909 Other constipation: Secondary | ICD-10-CM

## 2019-09-15 LAB — KAPPA/LAMBDA LIGHT CHAINS
Kappa free light chain: 23.1 mg/L — ABNORMAL HIGH (ref 3.3–19.4)
Kappa, lambda light chain ratio: 0.72 (ref 0.26–1.65)
Lambda free light chains: 32.2 mg/L — ABNORMAL HIGH (ref 5.7–26.3)

## 2019-09-15 LAB — BASIC METABOLIC PANEL
Anion gap: 11 (ref 5–15)
BUN: 21 mg/dL (ref 8–23)
CO2: 23 mmol/L (ref 22–32)
Calcium: 9 mg/dL (ref 8.9–10.3)
Chloride: 105 mmol/L (ref 98–111)
Creatinine, Ser: 0.98 mg/dL (ref 0.44–1.00)
GFR calc Af Amer: 59 mL/min — ABNORMAL LOW (ref >60)
GFR calc non Af Amer: 51 mL/min — ABNORMAL LOW (ref >60)
Glucose, Bld: 100 mg/dL — ABNORMAL HIGH (ref 70–99)
Potassium: 3.7 mmol/L (ref 3.5–5.1)
Sodium: 139 mmol/L (ref 135–145)

## 2019-09-15 LAB — BETA 2 MICROGLOBULIN, SERUM: Beta-2 Microglobulin: 2.6 mg/L — ABNORMAL HIGH (ref 0.6–2.4)

## 2019-09-15 MED ORDER — IPRATROPIUM-ALBUTEROL 0.5-2.5 (3) MG/3ML IN SOLN
3.0000 mL | Freq: Four times a day (QID) | RESPIRATORY_TRACT | Status: DC
  Administered 2019-09-15 – 2019-09-16 (×4): 3 mL via RESPIRATORY_TRACT
  Filled 2019-09-15 (×4): qty 3

## 2019-09-15 MED ORDER — CEFAZOLIN SODIUM-DEXTROSE 1-4 GM/50ML-% IV SOLN
1.0000 g | Freq: Once | INTRAVENOUS | Status: DC
  Filled 2019-09-15: qty 50

## 2019-09-15 NOTE — Progress Notes (Signed)
Hematology/Oncology Progress Note [facility] [phone] [phone]  Patient Care Team: Pleas Koch, NP as PCP - General (Internal Medicine)   Name of the patient: Brittany Hughes  [id number]  September 26, 1928  Date of visit: 09/15/19   INTERVAL HISTORY-  No new complaints.  Still has pelvic pain, exacerbated with weightbearing or changing position.  Currently comfortable at rest.    Review of systems- Review of Systems  Unable to perform ROS: Dementia  Constitutional: Positive for fatigue.  HENT:   Positive for hearing loss.   Respiratory: Positive for shortness of breath.   Musculoskeletal:       Pelvis pain    Allergies  Allergen Reactions   Codeine Other (See Comments) and Nausea And Vomiting    nausea   Colace [Docusate Calcium] Nausea Only    Patient Active Problem List   Diagnosis Date Noted   Multiple closed fractures of pelvis ([place])    Bone lesion    Lung mass    Pneumonia 09/12/2019   Hemorrhoids 02/15/2018   Allergic rhinitis 02/15/2018   Urinary incontinence 10/03/2017   Abnormality of gait 06/20/2016   Chronic constipation 04/18/2016   Hyperglycemia 04/18/2016   Anemia 04/18/2016   Hypothyroidism 04/18/2016   IBS (irritable bowel syndrome) 09/29/2014   Essential hypertension 09/29/2014   Neuropathy 09/29/2014   Insomnia 09/29/2014   Leg edema, left 04/07/2014   Dementia, vascular, with depression ([place]) 03/11/2014   GERD (gastroesophageal reflux disease) 01/06/2014   Frequent falls 01/06/2014   Hyperlipidemia with target LDL less than 100 11/12/2013   Arthritis of left knee 11/12/2013   Atrophy of vulva 02/18/2013   Malunion and nonunion of fracture(733.8) 02/18/2013   Vitamin D deficiency 02/18/2013   Chronic kidney disease, stage III (moderate) 02/18/2013   Other B-complex deficiencies 02/18/2013   Tobacco use disorder 02/18/2013   Other diseases of vocal cords  02/18/2013   Anxiety and depression 02/18/2013   Chronic pain syndrome 02/18/2013   Deafness or hearing loss of type classifiable to 389.0 with type classifiable to 389.1 02/18/2013   Occlusion and stenosis of carotid artery without mention of cerebral infarction 02/18/2013     Past Medical History:  Diagnosis Date   Atrophy of vulva    Baker's cyst 04/18/2016   Carotid stenosis    Chronic kidney disease    Depression    Dermatophytosis of nail    DVT (deep venous thrombosis) ([place]) 01/06/2014   RLE   Dysuria    GERD (gastroesophageal reflux disease)    Hyperglycemia 04/18/2016   Hyperlipidemia    Hyperpotassemia    Hypertension    Macular degeneration    "left eye; growth behind that" (01/06/2014)   Mild cognitive impairment, so stated    Nephrolithiasis    Osteoporosis, unspecified    Other B-complex deficiencies    Other diseases of larynx    Other voice and resonance disorders    Reflux esophagitis    Right wrist fracture 01/06/2014   Seizures ([place])    "one, years ago" (01/06/2014)   Unspecified hereditary and idiopathic peripheral neuropathy    Unspecified malignant neoplasm of skin, site unspecified    left foot   Unspecified urinary incontinence    Vitamin deficiency      Past Surgical History:  Procedure Laterality Date   CAROTID ENDARTERECTOMY Right 2006   CATARACT EXTRACTION W/ INTRAOCULAR LENS  IMPLANT, BILATERAL Bilateral 1990's   DILATION AND CURETTAGE OF UTERUS     EXCISIONAL HEMORRHOIDECTOMY  ° °Hematology/Oncology Progress Note °[facility] °[phone] [phone] ° °Patient Care Team: °Clark, Katherine K, NP as PCP - General (Internal Medicine)  ° °Name of the patient: Brittany Hughes  °[id number]  °03/28/1928  °Date of visit: 09/15/19 ° ° °INTERVAL HISTORY-  °No new complaints.  Still has pelvic pain, exacerbated with weightbearing or changing position.  Currently comfortable at rest. ° ° ° °Review of systems- Review of Systems  °Unable to perform ROS: Dementia  °Constitutional: Positive for fatigue.  °HENT:   Positive for hearing loss.   °Respiratory: Positive for shortness of breath.   °Musculoskeletal:  °     Pelvis pain  ° ° °Allergies  °Allergen Reactions  °• Codeine Other (See Comments) and Nausea And Vomiting  °  nausea  °• Colace [Docusate Calcium] Nausea Only  ° ° °Patient Active Problem List  ° Diagnosis Date Noted  °• Multiple closed fractures of pelvis (HCC)   °• Bone lesion   °• Lung mass   °• Pneumonia 09/12/2019  °• Hemorrhoids 02/15/2018  °• Allergic rhinitis 02/15/2018  °• Urinary incontinence 10/03/2017  °• Abnormality of gait 06/20/2016  °• Chronic constipation 04/18/2016  °• Hyperglycemia 04/18/2016  °• Anemia 04/18/2016  °• Hypothyroidism 04/18/2016  °• IBS (irritable bowel syndrome) 09/29/2014  °• Essential hypertension 09/29/2014  °• Neuropathy 09/29/2014  °• Insomnia 09/29/2014  °• Leg edema, left 04/07/2014  °• Dementia, vascular, with depression (HCC) 03/11/2014  °• GERD (gastroesophageal reflux disease) 01/06/2014  °• Frequent falls 01/06/2014  °• Hyperlipidemia with target LDL less than 100 11/12/2013  °• Arthritis of left knee 11/12/2013  °• Atrophy of vulva 02/18/2013  °• Malunion and nonunion of fracture(733.8) 02/18/2013  °• Vitamin D deficiency 02/18/2013  °• Chronic kidney disease, stage III (moderate) 02/18/2013  °• Other B-complex deficiencies 02/18/2013  °• Tobacco use disorder 02/18/2013  °• Other diseases of vocal cords  02/18/2013  °• Anxiety and depression 02/18/2013  °• Chronic pain syndrome 02/18/2013  °• Deafness or hearing loss of type classifiable to 389.0 with type classifiable to 389.1 02/18/2013  °• Occlusion and stenosis of carotid artery without mention of cerebral infarction 02/18/2013  ° ° ° °Past Medical History:  °Diagnosis Date  °• Atrophy of vulva   °• Baker's cyst 04/18/2016  °• Carotid stenosis   °• Chronic kidney disease   °• Depression   °• Dermatophytosis of nail   °• DVT (deep venous thrombosis) (HCC) 01/06/2014  ° RLE  °• Dysuria   °• GERD (gastroesophageal reflux disease)   °• Hyperglycemia 04/18/2016  °• Hyperlipidemia   °• Hyperpotassemia   °• Hypertension   °• Macular degeneration   ° "left eye; growth behind that" (01/06/2014)  °• Mild cognitive impairment, so stated   °• Nephrolithiasis   °• Osteoporosis, unspecified   °• Other B-complex deficiencies   °• Other diseases of larynx   °• Other voice and resonance disorders   °• Reflux esophagitis   °• Right wrist fracture 01/06/2014  °• Seizures (HCC)   ° "one, years ago" (01/06/2014)  °• Unspecified hereditary and idiopathic peripheral neuropathy   °• Unspecified malignant neoplasm of skin, site unspecified   ° left foot  °• Unspecified urinary incontinence   °• Vitamin deficiency   ° ° ° °Past Surgical History:  °Procedure Laterality Date  °• CAROTID ENDARTERECTOMY Right 2006  °• CATARACT EXTRACTION W/ INTRAOCULAR LENS  IMPLANT, BILATERAL Bilateral 1990's  °• DILATION AND CURETTAGE OF UTERUS    °• EXCISIONAL HEMORRHOIDECTOMY    ° °Hematology/Oncology Progress Note °[facility] °[phone] [phone] ° °Patient Care Team: °Clark, Katherine K, NP as PCP - General (Internal Medicine)  ° °Name of the patient: Brittany Hughes  °[id number]  °03/28/1928  °Date of visit: 09/15/19 ° ° °INTERVAL HISTORY-  °No new complaints.  Still has pelvic pain, exacerbated with weightbearing or changing position.  Currently comfortable at rest. ° ° ° °Review of systems- Review of Systems  °Unable to perform ROS: Dementia  °Constitutional: Positive for fatigue.  °HENT:   Positive for hearing loss.   °Respiratory: Positive for shortness of breath.   °Musculoskeletal:  °     Pelvis pain  ° ° °Allergies  °Allergen Reactions  °• Codeine Other (See Comments) and Nausea And Vomiting  °  nausea  °• Colace [Docusate Calcium] Nausea Only  ° ° °Patient Active Problem List  ° Diagnosis Date Noted  °• Multiple closed fractures of pelvis (HCC)   °• Bone lesion   °• Lung mass   °• Pneumonia 09/12/2019  °• Hemorrhoids 02/15/2018  °• Allergic rhinitis 02/15/2018  °• Urinary incontinence 10/03/2017  °• Abnormality of gait 06/20/2016  °• Chronic constipation 04/18/2016  °• Hyperglycemia 04/18/2016  °• Anemia 04/18/2016  °• Hypothyroidism 04/18/2016  °• IBS (irritable bowel syndrome) 09/29/2014  °• Essential hypertension 09/29/2014  °• Neuropathy 09/29/2014  °• Insomnia 09/29/2014  °• Leg edema, left 04/07/2014  °• Dementia, vascular, with depression (HCC) 03/11/2014  °• GERD (gastroesophageal reflux disease) 01/06/2014  °• Frequent falls 01/06/2014  °• Hyperlipidemia with target LDL less than 100 11/12/2013  °• Arthritis of left knee 11/12/2013  °• Atrophy of vulva 02/18/2013  °• Malunion and nonunion of fracture(733.8) 02/18/2013  °• Vitamin D deficiency 02/18/2013  °• Chronic kidney disease, stage III (moderate) 02/18/2013  °• Other B-complex deficiencies 02/18/2013  °• Tobacco use disorder 02/18/2013  °• Other diseases of vocal cords  02/18/2013  °• Anxiety and depression 02/18/2013  °• Chronic pain syndrome 02/18/2013  °• Deafness or hearing loss of type classifiable to 389.0 with type classifiable to 389.1 02/18/2013  °• Occlusion and stenosis of carotid artery without mention of cerebral infarction 02/18/2013  ° ° ° °Past Medical History:  °Diagnosis Date  °• Atrophy of vulva   °• Baker's cyst 04/18/2016  °• Carotid stenosis   °• Chronic kidney disease   °• Depression   °• Dermatophytosis of nail   °• DVT (deep venous thrombosis) (HCC) 01/06/2014  ° RLE  °• Dysuria   °• GERD (gastroesophageal reflux disease)   °• Hyperglycemia 04/18/2016  °• Hyperlipidemia   °• Hyperpotassemia   °• Hypertension   °• Macular degeneration   ° "left eye; growth behind that" (01/06/2014)  °• Mild cognitive impairment, so stated   °• Nephrolithiasis   °• Osteoporosis, unspecified   °• Other B-complex deficiencies   °• Other diseases of larynx   °• Other voice and resonance disorders   °• Reflux esophagitis   °• Right wrist fracture 01/06/2014  °• Seizures (HCC)   ° "one, years ago" (01/06/2014)  °• Unspecified hereditary and idiopathic peripheral neuropathy   °• Unspecified malignant neoplasm of skin, site unspecified   ° left foot  °• Unspecified urinary incontinence   °• Vitamin deficiency   ° ° ° °Past Surgical History:  °Procedure Laterality Date  °• CAROTID ENDARTERECTOMY Right 2006  °• CATARACT EXTRACTION W/ INTRAOCULAR LENS  IMPLANT, BILATERAL Bilateral 1990's  °• DILATION AND CURETTAGE OF UTERUS    °• EXCISIONAL HEMORRHOIDECTOMY    Hematology/Oncology Progress Note [facility] [phone] [phone]  Patient Care Team: Pleas Koch, NP as PCP - General (Internal Medicine)   Name of the patient: Brittany Hughes  [id number]  September 26, 1928  Date of visit: 09/15/19   INTERVAL HISTORY-  No new complaints.  Still has pelvic pain, exacerbated with weightbearing or changing position.  Currently comfortable at rest.    Review of systems- Review of Systems  Unable to perform ROS: Dementia  Constitutional: Positive for fatigue.  HENT:   Positive for hearing loss.   Respiratory: Positive for shortness of breath.   Musculoskeletal:       Pelvis pain    Allergies  Allergen Reactions   Codeine Other (See Comments) and Nausea And Vomiting    nausea   Colace [Docusate Calcium] Nausea Only    Patient Active Problem List   Diagnosis Date Noted   Multiple closed fractures of pelvis ([place])    Bone lesion    Lung mass    Pneumonia 09/12/2019   Hemorrhoids 02/15/2018   Allergic rhinitis 02/15/2018   Urinary incontinence 10/03/2017   Abnormality of gait 06/20/2016   Chronic constipation 04/18/2016   Hyperglycemia 04/18/2016   Anemia 04/18/2016   Hypothyroidism 04/18/2016   IBS (irritable bowel syndrome) 09/29/2014   Essential hypertension 09/29/2014   Neuropathy 09/29/2014   Insomnia 09/29/2014   Leg edema, left 04/07/2014   Dementia, vascular, with depression ([place]) 03/11/2014   GERD (gastroesophageal reflux disease) 01/06/2014   Frequent falls 01/06/2014   Hyperlipidemia with target LDL less than 100 11/12/2013   Arthritis of left knee 11/12/2013   Atrophy of vulva 02/18/2013   Malunion and nonunion of fracture(733.8) 02/18/2013   Vitamin D deficiency 02/18/2013   Chronic kidney disease, stage III (moderate) 02/18/2013   Other B-complex deficiencies 02/18/2013   Tobacco use disorder 02/18/2013   Other diseases of vocal cords  02/18/2013   Anxiety and depression 02/18/2013   Chronic pain syndrome 02/18/2013   Deafness or hearing loss of type classifiable to 389.0 with type classifiable to 389.1 02/18/2013   Occlusion and stenosis of carotid artery without mention of cerebral infarction 02/18/2013     Past Medical History:  Diagnosis Date   Atrophy of vulva    Baker's cyst 04/18/2016   Carotid stenosis    Chronic kidney disease    Depression    Dermatophytosis of nail    DVT (deep venous thrombosis) ([place]) 01/06/2014   RLE   Dysuria    GERD (gastroesophageal reflux disease)    Hyperglycemia 04/18/2016   Hyperlipidemia    Hyperpotassemia    Hypertension    Macular degeneration    "left eye; growth behind that" (01/06/2014)   Mild cognitive impairment, so stated    Nephrolithiasis    Osteoporosis, unspecified    Other B-complex deficiencies    Other diseases of larynx    Other voice and resonance disorders    Reflux esophagitis    Right wrist fracture 01/06/2014   Seizures ([place])    "one, years ago" (01/06/2014)   Unspecified hereditary and idiopathic peripheral neuropathy    Unspecified malignant neoplasm of skin, site unspecified    left foot   Unspecified urinary incontinence    Vitamin deficiency      Past Surgical History:  Procedure Laterality Date   CAROTID ENDARTERECTOMY Right 2006   CATARACT EXTRACTION W/ INTRAOCULAR LENS  IMPLANT, BILATERAL Bilateral 1990's   DILATION AND CURETTAGE OF UTERUS     EXCISIONAL HEMORRHOIDECTOMY  Hematology/Oncology Progress Note [facility] [phone] [phone]  Patient Care Team: Pleas Koch, NP as PCP - General (Internal Medicine)   Name of the patient: Brittany Hughes  [id number]  September 26, 1928  Date of visit: 09/15/19   INTERVAL HISTORY-  No new complaints.  Still has pelvic pain, exacerbated with weightbearing or changing position.  Currently comfortable at rest.    Review of systems- Review of Systems  Unable to perform ROS: Dementia  Constitutional: Positive for fatigue.  HENT:   Positive for hearing loss.   Respiratory: Positive for shortness of breath.   Musculoskeletal:       Pelvis pain    Allergies  Allergen Reactions   Codeine Other (See Comments) and Nausea And Vomiting    nausea   Colace [Docusate Calcium] Nausea Only    Patient Active Problem List   Diagnosis Date Noted   Multiple closed fractures of pelvis ([place])    Bone lesion    Lung mass    Pneumonia 09/12/2019   Hemorrhoids 02/15/2018   Allergic rhinitis 02/15/2018   Urinary incontinence 10/03/2017   Abnormality of gait 06/20/2016   Chronic constipation 04/18/2016   Hyperglycemia 04/18/2016   Anemia 04/18/2016   Hypothyroidism 04/18/2016   IBS (irritable bowel syndrome) 09/29/2014   Essential hypertension 09/29/2014   Neuropathy 09/29/2014   Insomnia 09/29/2014   Leg edema, left 04/07/2014   Dementia, vascular, with depression ([place]) 03/11/2014   GERD (gastroesophageal reflux disease) 01/06/2014   Frequent falls 01/06/2014   Hyperlipidemia with target LDL less than 100 11/12/2013   Arthritis of left knee 11/12/2013   Atrophy of vulva 02/18/2013   Malunion and nonunion of fracture(733.8) 02/18/2013   Vitamin D deficiency 02/18/2013   Chronic kidney disease, stage III (moderate) 02/18/2013   Other B-complex deficiencies 02/18/2013   Tobacco use disorder 02/18/2013   Other diseases of vocal cords  02/18/2013   Anxiety and depression 02/18/2013   Chronic pain syndrome 02/18/2013   Deafness or hearing loss of type classifiable to 389.0 with type classifiable to 389.1 02/18/2013   Occlusion and stenosis of carotid artery without mention of cerebral infarction 02/18/2013     Past Medical History:  Diagnosis Date   Atrophy of vulva    Baker's cyst 04/18/2016   Carotid stenosis    Chronic kidney disease    Depression    Dermatophytosis of nail    DVT (deep venous thrombosis) ([place]) 01/06/2014   RLE   Dysuria    GERD (gastroesophageal reflux disease)    Hyperglycemia 04/18/2016   Hyperlipidemia    Hyperpotassemia    Hypertension    Macular degeneration    "left eye; growth behind that" (01/06/2014)   Mild cognitive impairment, so stated    Nephrolithiasis    Osteoporosis, unspecified    Other B-complex deficiencies    Other diseases of larynx    Other voice and resonance disorders    Reflux esophagitis    Right wrist fracture 01/06/2014   Seizures ([place])    "one, years ago" (01/06/2014)   Unspecified hereditary and idiopathic peripheral neuropathy    Unspecified malignant neoplasm of skin, site unspecified    left foot   Unspecified urinary incontinence    Vitamin deficiency      Past Surgical History:  Procedure Laterality Date   CAROTID ENDARTERECTOMY Right 2006   CATARACT EXTRACTION W/ INTRAOCULAR LENS  IMPLANT, BILATERAL Bilateral 1990's   DILATION AND CURETTAGE OF UTERUS     EXCISIONAL HEMORRHOIDECTOMY

## 2019-09-15 NOTE — Evaluation (Signed)
Physical Therapy Evaluation Patient Details Name: Brittany Hughes MRN: [id number] DOB: [birth year] Today's Date: 09/15/2019   History of Present Illness  85yo female admitted for inability to walk with progressively worsening pain in her groin with walking in the last week; admitted for management of multiple pelvic fractures (bilat sacral ala, S1 and S4, R parasymphaseal, L inferior pubic rami) attributed to myeloma/metastatic disease.  Additional masses noted on imaging-infrahilar, perihilar and L adrenal mass; oncology consulted and involved.  Past medical history significant for DVT of the right lower extremity, GERD, Hyperlipidemia, Hypertension, Mild cognitive impairment, osteoporosis, seizures, Skin cancer of her left foot. Imaging suspicious for bronchogenic neoplasm with additional metastases and moderate R pleural effusion.  Clinical Impression  Upon arrival to room, patient alert and oriented to basic information; follows commands, but does require intermittent redirection to task at hand and recall of new information/details.  Bilat LE ROM generally guarded and cautious, limited by significant pain in bilat hips/pelvis.  Currently requiring max assist +2 for bed mobility; cga/min assist for sitting edge of bed.  Difficulty removing UE support in sitting, as relies heavily on UEs to offset WBing through pelvis in upright position.  Attempted sit/stand with RW x2, but unable to achieve full postural extension despite extensive assist due to pain with movement and WBing. Unsafe/unable to attempt stepping or OOB at this time. Would benefit from skilled PT to address above deficits and promote optimal return to PLOF.; recommend transition to STR upon discharge from acute hospitalization, as patient unsafe/unable to negotiate home environment at this time.      Follow Up Recommendations SNF    Equipment Recommendations  Rolling walker with 5" wheels;3in1 (PT);Hospital bed;Wheelchair  (measurements PT);Wheelchair cushion (measurements PT)(hoyer lift)    Recommendations for Other Services       Precautions / Restrictions Precautions Precautions: Fall Restrictions Weight Bearing Restrictions: Yes RLE Weight Bearing: Weight bearing as tolerated LLE Weight Bearing: Weight bearing as tolerated Other Position/Activity Restrictions: Per Dr. Joice Lofts      Mobility  Bed Mobility Overal bed mobility: Needs Assistance Bed Mobility: Supine to Sit;Sit to Supine     Supine to sit: Max assist;+2 for physical assistance Sit to supine: Max assist;+2 for physical assistance   General bed mobility comments: very limited ability to actively assist due to pain in bilat LEs/hips  Transfers Overall transfer level: Needs assistance Equipment used: Rolling walker (2 wheeled) Transfers: Sit to/from Stand Sit to Stand: Mod assist;+2 physical assistance         General transfer comment: partial sit/stand from edge of bed; heavy use of UEs on RW to assist with lift off and extension assist.  Unable to tolerate full extension or maintainance of position >3-4 seconds due to pain  Ambulation/Gait                Stairs            Wheelchair Mobility    Modified Rankin (Stroke Patients Only)       Balance Overall balance assessment: Needs assistance Sitting-balance support: No upper extremity supported;Feet supported Sitting balance-Leahy Scale: Fair     Standing balance support: Bilateral upper extremity supported Standing balance-Leahy Scale: Poor                               Pertinent Vitals/Pain Pain Assessment: Faces Faces Pain Scale: Hurts even more Pain Location: pelvis with mobility efforts Pain Descriptors / Indicators: Grimacing;Guarding;Aching Pain  Intervention(s): Limited activity within patient's tolerance;Monitored during session;Premedicated before session;Repositioned    Home Living Family/patient expects to be discharged to::  Private residence Living Arrangements: Alone Available Help at Discharge: Friend(s);Family;Available PRN/intermittently Type of Home: Apartment Home Access: Level entry     Home Layout: One level Home Equipment: Bedside commode;Grab bars - tub/shower;Walker - 2 wheels;Shower seat      Prior Function Level of Independence: Needs assistance   Gait / Transfers Assistance Needed: ambulating with RW  ADL's / Homemaking Assistance Needed: friend manages medications and cooks  Comments: Ambulatory with RW for ADLs, household distances at baseline; does endorse multiple falls (at least 3 in previous 1-2 weeks) with progressive decline in recent 2-3 weeks due to worsening hip/LE pain.  Friend/family (Niece) manages medication, meals and community needs.     Hand Dominance   Dominant Hand: Right    Extremity/Trunk Assessment   Upper Extremity Assessment Upper Extremity Assessment: Overall WFL for tasks assessed    Lower Extremity Assessment Lower Extremity Assessment: Generalized weakness(R LE 3/5, L LE 3-/5, generally limited by pain throughout)       Communication   Communication: HOH  Cognition Arousal/Alertness: Awake/alert Behavior During Therapy: WFL for tasks assessed/performed Overall Cognitive Status: History of cognitive impairments - at baseline                                 General Comments: Pt pleasant, follows commands with occasional cues, oriented x3      General Comments      Exercises Other Exercises Other Exercises: Functional transfer training   Assessment/Plan    PT Assessment Patient needs continued PT services  PT Problem List Decreased strength;Decreased range of motion;Decreased activity tolerance;Decreased balance;Decreased mobility;Decreased coordination;Decreased cognition;Decreased knowledge of use of DME;Decreased safety awareness;Decreased knowledge of precautions;Cardiopulmonary status limiting activity;Pain       PT  Treatment Interventions DME instruction;Gait training;Functional mobility training;Therapeutic activities;Therapeutic exercise;Balance training;Cognitive remediation;Patient/family education    PT Goals (Current goals can be found in the Care Plan section)  Acute Rehab PT Goals Patient Stated Goal: have less pain PT Goal Formulation: With patient/family Time For Goal Achievement: 09/29/19 Potential to Achieve Goals: Fair    Frequency Min 2X/week   Barriers to discharge Decreased caregiver support      Co-evaluation   Reason for Co-Treatment: Complexity of the patient's impairments (multi-system involvement);For patient/therapist safety;To address functional/ADL transfers PT goals addressed during session: Mobility/safety with mobility;Balance OT goals addressed during session: Proper use of Adaptive equipment and DME;ADL's and self-care       AM-PAC PT "6 Clicks" Mobility  Outcome Measure Help needed turning from your back to your side while in a flat bed without using bedrails?: Total Help needed moving from lying on your back to sitting on the side of a flat bed without using bedrails?: Total Help needed moving to and from a bed to a chair (including a wheelchair)?: Total Help needed standing up from a chair using your arms (e.g., wheelchair or bedside chair)?: Total Help needed to walk in hospital room?: Total Help needed climbing 3-5 steps with a railing? : Total 6 Click Score: 6    End of Session Equipment Utilized During Treatment: Gait belt Activity Tolerance: Patient limited by pain Patient left: in bed;with call bell/phone within reach;with bed alarm set;with family/visitor present   PT Visit Diagnosis: Muscle weakness (generalized) (M62.81);Repeated falls (R29.6);Difficulty in walking, not elsewhere classified (R26.2);Pain Pain - Right/Left: Left  Pain - part of body: Hip    Time: 5366-4403 PT Time Calculation (min) (ACUTE ONLY): 26 min   Charges:   PT  Evaluation $PT Eval Moderate Complexity: 1 Mod          Asma Boldon H. Manson Passey, PT, DPT, NCS 09/15/19, 2:47 PM [phone]

## 2019-09-15 NOTE — Consult Note (Addendum)
Reason for Consult: Evaluate for potential bronchoscopy Referring Physician: Karie Kirks, DO  Brittany Hughes is an 85 y.o. female.  HPI: Patient is an 85 year old former smoker with a history as noted below, who was admitted to [facility] on 12 September 2019 due to increasing left-sided pain for approximately a month prior to admission.  The patient is a challenging historian due to early dementia issues.  I have reviewed the medical records available.  A friend was available at bedside to corroborate some historical aspects.  I have also spoken to the patient's niece and HPOA Terri via phone conversation.  The patient's friend who is at the bedside noted that the patient lives by herself but is check up on by neighbors and friends frequently.  Also the patient's nieces check on her as well.  They have noted that over the last month she has complained of pain mostly on the left groin and has been limited in ambulation progressively.  On evaluation at the ED on the day of admission a CT was performed for renal stone study.  This showed multiple acute to subacute pelvic fractures involving the S1 and S4 vertebral bodies, bilateral sacral ala, right parasymphyseal pubic bone and left inferior pubic ramus.  She has basically been bedbound since admission due to these issues.  The patient was also noted to have possible acute to subacute compression fracture of L5.  She has a chronic L1 compression deformity as well.  There was also an enlarged right ovary and indeterminate left adrenal nodule.  These findings have been concerning for underlying myeloma or metastatic disease.  The patient was evaluated by Oncology on 7 November.  A CT scan of the chest was obtained she was unable to have contrast due to poor renal function.  I have reviewed the CT scan independently.  There is a right perihilar mass and right infrahilar masslike lesion both of these are fairly large.  The infrahilar mass is associated with postobstructive  process and right lower lobe opacity.  She also has significant mediastinal bulky adenopathy.  There is a moderate pleural effusion that may be due to atelectasis.  The patient seemed to understand that she has extensive findings consistent with cancer.  Though she gets somewhat confused during the interview she does express that she has "had a good life" and she really does not want invasive procedures.  She wants to concentrate on comfort.   As noted history is difficult to obtain from the patient however she does state that she has had issues with shortness of breath and cough at least for the last 6 months.  More recently over the last 2 months shortness of breath and cough had gotten worse.  She does not endorse hemoptysis.   Past Medical History:  Diagnosis Date  . Atrophy of vulva   . Baker's cyst 04/18/2016  . Carotid stenosis   . Chronic kidney disease   . Depression   . Dermatophytosis of nail   . DVT (deep venous thrombosis) ([place]) 01/06/2014   RLE  . Dysuria   . GERD (gastroesophageal reflux disease)   . Hyperglycemia 04/18/2016  . Hyperlipidemia   . Hyperpotassemia   . Hypertension   . Macular degeneration    "left eye; growth behind that" (01/06/2014)  . Mild cognitive impairment, so stated   . Nephrolithiasis   . Osteoporosis, unspecified   . Other B-complex deficiencies   . Other diseases of larynx   . Other voice and resonance disorders   .  Reflux esophagitis   . Right wrist fracture 01/06/2014  . Seizures ([place])    "one, years ago" (01/06/2014)  . Unspecified hereditary and idiopathic peripheral neuropathy   . Unspecified malignant neoplasm of skin, site unspecified    left foot  . Unspecified urinary incontinence   . Vitamin deficiency     Past Surgical History:  Procedure Laterality Date  . CAROTID ENDARTERECTOMY Right 2006  . CATARACT EXTRACTION W/ INTRAOCULAR LENS  IMPLANT, BILATERAL Bilateral 1990's  . DILATION AND CURETTAGE OF UTERUS    . EXCISIONAL  HEMORRHOIDECTOMY  1970's?  [facility] STOMACH SURGERY  ~ 1960   "had ulcers for years; left scar tissue; did OR" (01/06/2014)  . VAGINAL HYSTERECTOMY  ~ 1980    Family History  Problem Relation Age of Onset  . Heart disease Mother 95  . Kidney disease Father   . Diabetes Sister   . Heart disease Brother 75  . Heart attack Son 34  . Cancer Son        Throat  . Stroke Brother   . Cancer Brother   . Cancer Sister        Kidney    Social History:  The patient smoked heavily (a pack to over a pack a day) up until 2 years ago.  At that time she switched to a vape but quit this approximately 6 months ago due to increasing cough.  She does not drink alcohol nor use drugs.  Best estimate is 30+ years of smoking.  Allergies:  Allergies  Allergen Reactions  . Codeine Other (See Comments) and Nausea And Vomiting    nausea  . Colace [Docusate Calcium] Nausea Only    Medications:  I have reviewed the patient's current medications. Prior to Admission:  Medications Prior to Admission  Medication Sig Dispense Refill Last Dose  . AMITIZA 24 MCG capsule TAKE 1 CAPSULE (24 MCG TOTAL) BY MOUTH 2 (TWO) TIMES DAILY WITH A MEAL. (Patient taking differently: Take 24 mcg by mouth 2 (two) times daily with a meal. ) 180 capsule 1 09/11/2019 at Unknown time  . amitriptyline (ELAVIL) 75 MG tablet Take 1 tablet (75 mg total) by mouth at bedtime. For sleep. 90 tablet 0 09/11/2019 at 2100  . antiseptic oral rinse (BIOTENE) LIQD 1 application by Mouth Rinse route every 4 (four) hours as needed for dry mouth.   Unknown at PRN  . cholecalciferol (VITAMIN D) 1000 units tablet Take 1,000 Units by mouth daily.   09/11/2019 at Unknown time  . docusate sodium (COLACE) 100 MG capsule Take 100 mg by mouth daily.   09/11/2019 at Unknown time  . gabapentin (NEURONTIN) 300 MG capsule TAKE 1 CAPSULE BY MOUTH EVERYDAY AT BEDTIME (Patient taking differently: Take 300 mg by mouth at bedtime. ) 90 capsule 1 09/11/2019 at 2100  .  levocetirizine (XYZAL) 5 MG tablet Take 1 tablet (5 mg total) by mouth every evening. For allergies. 90 tablet 0 09/11/2019 at Unknown time  . levothyroxine (SYNTHROID) 25 MCG tablet 1 TAB EVERY MORNING ON AN EMPTY STOMACH WITH FULL GLASS OF WATER. NO FOOD/MEDS FOR 30 MINUTES. (Patient taking differently: Take 25 mcg by mouth daily before breakfast. ) 90 tablet 0 09/11/2019 at Unknown time  . lisinopril (ZESTRIL) 5 MG tablet TAKE 1 TABLET BY MOUTH EVERY DAY (Patient taking differently: Take 5 mg by mouth daily. ) 90 tablet 1 09/11/2019 at Unknown time  . Melatonin 3 MG TABS Take 1 tablet (3 mg total) by mouth daily. 30 tablet  0 09/11/2019 at Unknown time  . meloxicam (MOBIC) 7.5 MG tablet Take 7.5 mg by mouth daily.   09/11/2019 at Unknown time  . metoprolol succinate (TOPROL-XL) 50 MG 24 hr tablet TAKE 1 TABLET BY MOUTH EVERY DAY (Patient taking differently: Take 50 mg by mouth daily. ) 90 tablet 0 09/11/2019 at Unknown time  . omeprazole (PRILOSEC) 20 MG capsule TAKE 1 CAPSULE (20 MG TOTAL) BY MOUTH DAILY. FOR ACID REFLUX (Patient taking differently: Take 20 mg by mouth daily. ) 90 capsule 3 09/11/2019 at Unknown time  . Polyethyl Glycol-Propyl Glycol (SYSTANE) 0.4-0.3 % SOLN Apply 1 drop to eye daily as needed (for dry eyes).   Unknown at PRN  . promethazine (PHENERGAN) 12.5 MG tablet TAKE 1 TABLET BY MOUTH EVERY 12 HOURS AS NEEDED FOR NAUSEA OR VOMITING (Patient taking differently: Take 12.5 mg by mouth every 12 (twelve) hours as needed for nausea or vomiting. ) 30 tablet 0 Unknown at PRN  . traMADol (ULTRAM) 50 MG tablet Take 50 mg by mouth every 6 (six) hours as needed for pain.   Unknown at PRN  . vitamin B-12 (CYANOCOBALAMIN) 1000 MCG tablet Take 1,000 mcg by mouth daily.   09/11/2019 at Unknown time  . hydrocortisone (ANUSOL-HC) 25 MG suppository Place 1 suppository (25 mg total) rectally 2 (two) times daily. (Patient not taking: Reported on 08/21/2018) 12 suppository 0 Not Taking at Unknown time     Results for orders placed or performed during the hospital encounter of 09/12/19 (from the past 48 hour(s))  CBC with Differential/Platelet     Status: Abnormal   Collection Time: 09/14/19  6:35 AM  Result Value Ref Range   WBC 12.4 (H) 4.0 - 10.5 K/uL   RBC 3.88 3.87 - 5.11 MIL/uL   Hemoglobin 9.8 (L) 12.0 - 15.0 g/dL   HCT 31.1 (L) 36.0 - 46.0 %   MCV 80.2 80.0 - 100.0 fL   MCH 25.3 (L) 26.0 - 34.0 pg   MCHC 31.5 30.0 - 36.0 g/dL   RDW 16.5 (H) 11.5 - 15.5 %   Platelets 198 150 - 400 K/uL   nRBC 0.0 0.0 - 0.2 %   Neutrophils Relative % 83 %   Neutro Abs 10.3 (H) 1.7 - 7.7 K/uL   Lymphocytes Relative 9 %   Lymphs Abs 1.1 0.7 - 4.0 K/uL   Monocytes Relative 6 %   Monocytes Absolute 0.8 0.1 - 1.0 K/uL   Eosinophils Relative 1 %   Eosinophils Absolute 0.1 0.0 - 0.5 K/uL   Basophils Relative 0 %   Basophils Absolute 0.0 0.0 - 0.1 K/uL   Immature Granulocytes 1 %   Abs Immature Granulocytes 0.14 (H) 0.00 - 0.07 K/uL    Comment: Performed at [facility], [address] [place], [place] [address]  Comprehensive metabolic panel     Status: Abnormal   Collection Time: 09/14/19  6:35 AM  Result Value Ref Range   Sodium 138 135 - 145 mmol/L   Potassium 3.9 3.5 - 5.1 mmol/L   Chloride 104 98 - 111 mmol/L   CO2 23 22 - 32 mmol/L   Glucose, Bld 112 (H) 70 - 99 mg/dL   BUN 22 8 - 23 mg/dL   Creatinine, Ser 0.92 0.44 - 1.00 mg/dL   Calcium 9.1 8.9 - 10.3 mg/dL   Total Protein 6.5 6.5 - 8.1 g/dL   Albumin 2.8 (L) 3.5 - 5.0 g/dL   AST 15 15 - 41 U/L   ALT  10 0 - 44 U/L   Alkaline Phosphatase 126 38 - 126 U/L   Total Bilirubin 0.7 0.3 - 1.2 mg/dL   GFR calc non Af Amer 55 (L) >60 mL/min   GFR calc Af Amer >60 >60 mL/min   Anion gap 11 5 - 15    Comment: Performed at [facility], [address] [place], [place] [address]  CBC     Status: Abnormal   Collection Time: 09/14/19  4:53 PM  Result Value Ref Range   WBC 11.8 (H) 4.0 - 10.5 K/uL   RBC 3.70 (L)  3.87 - 5.11 MIL/uL   Hemoglobin 9.3 (L) 12.0 - 15.0 g/dL   HCT 29.6 (L) 36.0 - 46.0 %   MCV 80.0 80.0 - 100.0 fL   MCH 25.1 (L) 26.0 - 34.0 pg   MCHC 31.4 30.0 - 36.0 g/dL   RDW 16.4 (H) 11.5 - 15.5 %   Platelets 197 150 - 400 K/uL   nRBC 0.0 0.0 - 0.2 %    Comment: Performed at [facility], [address] [place], [place] [address]  Basic metabolic panel     Status: Abnormal   Collection Time: 09/15/19  4:33 AM  Result Value Ref Range   Sodium 139 135 - 145 mmol/L   Potassium 3.7 3.5 - 5.1 mmol/L   Chloride 105 98 - 111 mmol/L   CO2 23 22 - 32 mmol/L   Glucose, Bld 100 (H) 70 - 99 mg/dL   BUN 21 8 - 23 mg/dL   Creatinine, Ser 0.98 0.44 - 1.00 mg/dL   Calcium 9.0 8.9 - 10.3 mg/dL   GFR calc non Af Amer 51 (L) >60 mL/min   GFR calc Af Amer 59 (L) >60 mL/min   Anion gap 11 5 - 15    Comment: Performed at [facility], [place], [place] [address]    Ct Chest Wo Contrast  Result Date: 09/13/2019 CLINICAL DATA:  Abnormal chest radiograph EXAM: CT CHEST WITHOUT CONTRAST TECHNIQUE: Multidetector CT imaging of the chest was performed following the standard protocol without IV contrast. COMPARISON:  Chest radiograph dated 09/12/2019. CT abdomen/pelvis dated 09/12/2019. FINDINGS: Cardiovascular: Heart is normal in size.  No pericardial effusion. No evidence of thoracic aortic aneurysm. Atherosclerotic calcifications of the aortic arch. Three vessel coronary atherosclerosis. Mediastinum/Nodes: Dominant 7.8 x 4.9 cm right perihilar mass extending to the mediastinum (series 2/image 68), suspicious for primary bronchogenic neoplasm. Associated 1.8 cm subcarinal node (series 2/image 61) and bulky paratracheal nodes measuring up to 2.7 cm (series 2/image 45). Visualized thyroid is unremarkable. Lungs/Pleura: Right infrahilar masslike opacity measuring up to 4.6 x 6.2 cm (series 3/image 91), suspicious for primary bronchogenic neoplasm. Associated postobstructive  right lower lobe opacity/atelectasis, infection/pneumonia not excluded. Moderate right pleural effusion. Ground-glass opacity in the right upper and middle lobes. Left lung is clear. No pneumothorax. Upper Abdomen: Unchanged from recent CT, including a 17 mm left adrenal nodule, favoring a metastasis. Musculoskeletal: No focal osseous lesions. IMPRESSION: 6.2 cm right infrahilar masslike opacity, suspicious for primary bronchogenic neoplasm. 7.8 cm right perihilar masslike opacity extending to the mediastinum, likely reflecting associated nodal metastases. Additional mediastinal nodal metastases, including a bulky paratracheal node, as above. Associated postobstructive opacities in the right lung. Moderate right pleural effusion. 17 mm left adrenal nodule, favoring a metastasis. Aortic Atherosclerosis (ICD10-I70.0). Electronically Signed   By: Julian Hy M.D.   On: 09/13/2019 16:02   US Pelvis (transabdominal Only)  Result Date: 09/13/2019  CLINICAL DATA:  Enlarged right ovary on CT EXAM: TRANSABDOMINAL ULTRASOUND OF PELVIS TECHNIQUE: Transabdominal ultrasound examination of the pelvis was performed including evaluation of the uterus, ovaries, adnexal regions, and pelvic cul-de-sac. COMPARISON:  None. FINDINGS: Uterus Surgically absent. Right ovary Measurements: 3.0 x 2.1 x 3.3 cm = volume: 11 mL. Poorly visualized but grossly unremarkable. Left ovary Not discretely visualized.  No adnexal mass is seen. Other findings:  No abnormal free fluid. IMPRESSION: Right ovary is poorly visualized but grossly unremarkable on ultrasound. Left ovary is not discretely visualized but normal on recent CT. Status post hysterectomy. Electronically Signed   By: Julian Hy M.D.   On: 09/13/2019 16:05    Review of Systems  Unable to perform ROS: Dementia  Review of systems limited due to the patient's dementia.  Positives as noted on the HPI.  Blood pressure 120/63, pulse 92, temperature 98.7 F (37.1 C),  temperature source Oral, resp. rate 18, height 5\' 2"  (1.575 m), weight 68 kg, SpO2 90 %. Physical Exam  Constitutional: She appears well-developed and well-nourished. No distress.  HENT:  Head: Normocephalic and atraumatic.  Right Ear: External ear normal.  Left Ear: External ear normal.  Nose: Nose normal.  Mouth/Throat: Oropharynx is clear and moist. No oropharyngeal exudate.  Eyes: Pupils are equal, round, and reactive to light. Conjunctivae are normal. No scleral icterus.  Neck: Neck supple. No JVD present. No tracheal deviation present. No thyromegaly present.  Cardiovascular: Normal rate, regular rhythm, normal heart sounds and intact distal pulses.  No murmur heard. Respiratory: Effort normal. No respiratory distress. She has decreased breath sounds in the right lower field. She has no wheezes. She has rhonchi. She has no rales.  Diffuse rhonchi particularly in the upper lung zones  GI: Soft. She exhibits no distension.  Musculoskeletal:     Comments: Patient currently bedbound.  Muscle tone appears intact.  Limited mobility due to pelvic fractures.  Lymphadenopathy:    She has no cervical adenopathy.  Neurological: She is alert.  Occasionally befuddled.  Rambles with history.  Skin: Skin is warm and dry. Nails show clubbing.  Psychiatric: She has a normal mood and affect. Her speech is normal and behavior is normal. Cognition and memory are impaired.    Assessment/Plan: 1.  Lung mass, carcinoma until proven otherwise: Patient appears to have stage IV disease as she appears to have bony metastases, and left adrenal metastasis.  Review of oncology note shows that the patient is not a candidate for chemotherapy and I agree.  She is debilitated, of advanced age and with dementia issues.  She is also not a surgical candidate.  She is a high risk for complications from a bronchoscopy procedure.  The patient is not enthusiastic about undergoing any aggressive invasive procedures.  She  does seem to have significant sputum production and will obtain sputum for cytology we may be able to make the diagnosis this way.  I have spoken to the patient's niece and HPOA, Crist Fat [phone]) who corroborates that she has discussed the issue with the patient's other niece (they are her only family) and that they are both in agreement that they will want palliative care and hospice for the patient.  I agree with this as the patient as noted above is not a chemotherapy nor surgical candidate and invasive procedures risk far outweighs potential benefit.  I have placed a palliative care consult and updated the patient's CODE STATUS accordingly to DNR status.  I have discussed this with Dr.  Ava Swayze.  2.  Sacral and pelvic fractures likely due to metastatic disease: Patient to undergo percutaneous sacral plasty per Dr. Rudene Christians, agree as this is a palliative measure and will hopefully help with the patient's pain.  3.  COPD by clinical impression: DuoNebs 4 times daily, this will help with mucociliary clearance.  Will also help with collection of sputum for cytology.  4.  Mild cognitive impairment/dementia: This issue adds complexity to her management.  5.  Advanced age and frailty: This issue adds complexity to her management.  She may benefit from SNF with hospice care.   Thank you for allowing [facility] to evaluate and participate in this patient's care.   Renold Don, MD [facility] 09/15/2019, 3:02 PM   This chart was dictated using voice recognition software/Dragon.  Despite best efforts to proofread, errors can occur which can change the meaning.  Any change was purely unintentional.

## 2019-09-15 NOTE — Care Management Important Message (Signed)
Important Message  Patient Details  Name: Brittany Hughes MRN: [id number] Date of Birth: Mar 14, 1928   Medicare Important Message Given:  Yes     Dannette Barbara 09/15/2019, 12:24 PM

## 2019-09-15 NOTE — Evaluation (Signed)
Occupational Therapy Evaluation Patient Details Name: Brittany Hughes MRN: [id number] DOB: 06/10/1928 Today's Date: 09/15/2019    History of Present Illness 85yo female admitted for inability to walk with progressively worsening pain in her groin with walking in the last week. Also shortness of breath and dysuria. Past medical history significant for DVT of the right lower extremity, GERD, Hyperlipidemia, Hypertension, Mild cognitive impairment, osteoporosis, seizures, Skin cancer of her left foot. Imaging suspicious for bronchogenic neoplasm with additional metastases and moderate R pleural effusion.   Clinical Impression   Pt seen for OT evaluation and co-tx with PT for safety and functional transfer training this date. Prior to hospital admission and recent hip pain, pt was ambulating with RW and relatively independent with basic ADL tasks. Friend present during session and reports that she has been assisting pt with medication mgt and meal prep.  Pt lives by herself in an apartment. Currently pt demonstrates impairments in pelvic pain, decreased strength, impaired balance, and mild cognitive impairment and now requires Mod-Max A x2 for bed mobility and LB ADL tasks. Pain limited. Pt would benefit from skilled OT to address noted impairments and functional limitations (see below for any additional details) in order to maximize safety and independence while minimizing falls risk and caregiver burden.  Upon hospital discharge, recommend pt discharge to SNF. If pt returns home with family, would require hospital bed and possible hoyer lift, w/c.    Follow Up Recommendations  SNF    Equipment Recommendations  None recommended by OT    Recommendations for Other Services       Precautions / Restrictions Precautions Precautions: Fall Restrictions Weight Bearing Restrictions: Yes RLE Weight Bearing: Weight bearing as tolerated LLE Weight Bearing: Weight bearing as tolerated Other  Position/Activity Restrictions: Per Dr. Roland Rack      Mobility Bed Mobility Overal bed mobility: Needs Assistance Bed Mobility: Supine to Sit;Sit to Supine     Supine to sit: HOB elevated;Mod assist;Max assist;+2 for physical assistance Sit to supine: Max assist;+2 for physical assistance;Total assist      Transfers Overall transfer level: Needs assistance Equipment used: Rolling walker (2 wheeled) Transfers: Sit to/from Stand Sit to Stand: Mod assist;+2 physical assistance              Balance Overall balance assessment: Needs assistance Sitting-balance support: Feet supported;Bilateral upper extremity supported Sitting balance-Leahy Scale: Fair     Standing balance support: Bilateral upper extremity supported Standing balance-Leahy Scale: Poor                             ADL either performed or assessed with clinical judgement   ADL Overall ADL's : Needs assistance/impaired Eating/Feeding: Set up;Bed level   Grooming: Bed level;Set up   Upper Body Bathing: Minimal assistance;Sitting   Lower Body Bathing: Maximal assistance;+2 for physical assistance;Sit to/from stand   Upper Body Dressing : Minimal assistance;Sitting   Lower Body Dressing: Sit to/from stand;Maximal assistance;+2 for physical assistance   Toilet Transfer: +2 for physical assistance;RW;Maximal assistance;Stand-pivot;BSC                   Vision Baseline Vision/History: Wears glasses(legally blind in L eye) Wears Glasses: At all times Patient Visual Report: No change from baseline Vision Assessment?: No apparent visual deficits     Perception     Praxis      Pertinent Vitals/Pain Pain Assessment: Faces Faces Pain Scale: Hurts even more Pain Location: pelvis with mobility  efforts Pain Descriptors / Indicators: Grimacing;Guarding Pain Intervention(s): Limited activity within patient's tolerance;Monitored during session;Repositioned;Premedicated before session      Hand Dominance Right   Extremity/Trunk Assessment Upper Extremity Assessment Upper Extremity Assessment: Overall WFL for tasks assessed(FMC slow bilat)   Lower Extremity Assessment Lower Extremity Assessment: Defer to PT evaluation;Generalized weakness(pain limited, at least 3/5 bilat)       Communication Communication Communication: HOH   Cognition Arousal/Alertness: Awake/alert Behavior During Therapy: WFL for tasks assessed/performed Overall Cognitive Status: History of cognitive impairments - at baseline                                 General Comments: Pt pleasant, follows commands with occasional cues, oriented x3   General Comments       Exercises Other Exercises Other Exercises: Functional transfer training   Shoulder Instructions      Home Living Family/patient expects to be discharged to:: Private residence Living Arrangements: Alone Available Help at Discharge: Friend(s);Family;Available PRN/intermittently(friend comes by 3-4 times/day) Type of Home: Apartment Home Access: Level entry     Home Layout: One level     Bathroom Shower/Tub: Teacher, early years/pre: Handicapped height     Home Equipment: Bedside commode;Grab bars - tub/shower;Walker - 2 wheels;Shower seat          Prior Functioning/Environment Level of Independence: Needs assistance  Gait / Transfers Assistance Needed: ambulating with RW ADL's / Homemaking Assistance Needed: friend manages medications and cooks   Comments: per chart and friend, multiple falls        OT Problem List: Decreased strength;Pain;Decreased range of motion;Decreased cognition;Impaired balance (sitting and/or standing);Decreased knowledge of use of DME or AE;Impaired vision/perception      OT Treatment/Interventions: Self-care/ADL training;Therapeutic exercise;Therapeutic activities;DME and/or AE instruction;Patient/family education;Balance training    OT Goals(Current goals  can be found in the care plan section) Acute Rehab OT Goals Patient Stated Goal: have less pain OT Goal Formulation: With patient Time For Goal Achievement: 09/29/19 Potential to Achieve Goals: Good ADL Goals Pt Will Perform Lower Body Dressing: with mod assist;sit to/from stand Pt Will Transfer to Toilet: with max assist;bedside commode;stand pivot transfer Additional ADL Goal #1: Pt will perform bed mobility for ADL with Mod Ax2  OT Frequency: Min 1X/week   Barriers to D/C: Decreased caregiver support          Co-evaluation PT/OT/SLP Co-Evaluation/Treatment: Yes Reason for Co-Treatment: For patient/therapist safety;To address functional/ADL transfers PT goals addressed during session: Balance;Mobility/safety with mobility;Proper use of DME OT goals addressed during session: Proper use of Adaptive equipment and DME;ADL's and self-care      AM-PAC OT "6 Clicks" Daily Activity     Outcome Measure Help from another person eating meals?: None Help from another person taking care of personal grooming?: None Help from another person toileting, which includes using toliet, bedpan, or urinal?: Total Help from another person bathing (including washing, rinsing, drying)?: A Lot Help from another person to put on and taking off regular upper body clothing?: A Little Help from another person to put on and taking off regular lower body clothing?: A Lot 6 Click Score: 16   End of Session Equipment Utilized During Treatment: Gait belt;Rolling walker Nurse Communication: (PureWick needs replaced, unable to get sputum sample collected during session)  Activity Tolerance: Patient limited by pain Patient left: in bed;with call bell/phone within reach;with bed alarm set;with family/visitor present  OT Visit Diagnosis: Other  abnormalities of gait and mobility (R26.89);Repeated falls (R29.6);Muscle weakness (generalized) (M62.81);Pain Pain - Right/Left: Left(bilat, L>R) Pain - part of body: Hip                 Time: 9983-3825 OT Time Calculation (min): 36 min Charges:  OT General Charges $OT Visit: 1 Visit OT Evaluation $OT Eval Moderate Complexity: 1 Mod OT Treatments $Therapeutic Activity: 8-22 mins  Jeni Salles, MPH, MS, OTR/L ascom [phone] 09/15/19, 1:31 PM

## 2019-09-15 NOTE — Progress Notes (Signed)
Patient is seen after Dr. Roland Rack is seen over the weekend and told me about her situation.  She has what appears to be pathologic or at least insufficiency fractures of the sacrum and is clearly in a great deal of pain from this.  She has been having trouble turning in bed and being mobilized.  I reviewed her studies and examined her today.  She is tender to percussion at the sacrum she did not have pain with logrolling of the hips although she has some anterior pubis tenderness.  She has no clonus.  I discussed with her niece who is her decision-maker I think sacral plasty might give her improve quality of life with decreased pain.  And we can do that tomorrow we will plan on that at present.

## 2019-09-15 NOTE — Progress Notes (Signed)
PROGRESS NOTE  Brittany Hughes UXL:[id number] DOB: 1928/05/05 DOA: 09/12/2019 PCP: Doreene Nest, NP  Brief History   Unable to walk. Pt with progressively worsening pain in her groin with walking in the last week. Also shortness of breath and dysuria.  The patient is a 85 yr old woman who lives at home on her home with help from her niece and others among her "people", as she calls them, that help her. The patient has a past medical history significant for DVT of the right lower extremity, GERD, Hyperlipidemia, Hypertension, Mild cognitive impairment, osteoporosis, seizures, Skin cancer of her left foot. She states that she does not drink alcohol, and she quit smoking one year ago.  In the ED the patient is found to have multiple pelvic fractures. Some of these appear to be related to osteopenia. Some of these are suspicious for possible multiple myeloma or metastasis. She is also found to have a right lower lobe pneumonia and UTI. There is also a left pelvic mass and a right lower lobe mass.  The patient has been admitted to a medical bed. This patient is receiving IV antibiotics. She is receiving pain control. Consults have been requested for orthopedic surgery and oncology. I appreciate their help.  I have consulted pulmonology for biopsy via bronchoscopy. Pt has been consulted to evaluate and treat.  Consultants   Orthopedic surgery  Oncology  Pulmonology  Procedures   None  Antibiotics   Anti-infectives (From admission, onward)   Start     Dose/Rate Route Frequency Ordered Stop   09/14/19 1400  amoxicillin-clavulanate (AUGMENTIN) 500-125 MG per tablet 500 mg  Status:  Discontinued     1 tablet Oral Every 8 hours 09/14/19 1135 09/14/19 1135   09/14/19 1200  cefTRIAXone (ROCEPHIN) 1 g in sodium chloride 0.9 % 100 mL IVPB     1 g 200 mL/hr over 30 Minutes Intravenous Every 24 hours 09/14/19 1135     09/13/19 1700  azithromycin (ZITHROMAX) 500 mg in sodium chloride 0.9 %  250 mL IVPB     500 mg 250 mL/hr over 60 Minutes Intravenous Every 24 hours 09/12/19 1412     09/13/19 1600  cefTRIAXone (ROCEPHIN) 1 g in sodium chloride 0.9 % 100 mL IVPB  Status:  Discontinued     1 g 200 mL/hr over 30 Minutes Intravenous Every 24 hours 09/12/19 1412 09/14/19 1135   09/12/19 1330  azithromycin (ZITHROMAX) 500 mg in sodium chloride 0.9 % 250 mL IVPB     500 mg 250 mL/hr over 60 Minutes Intravenous  Once 09/12/19 1315 09/12/19 1752   09/12/19 1330  cefTRIAXone (ROCEPHIN) 1 g in sodium chloride 0.9 % 100 mL IVPB     1 g 200 mL/hr over 30 Minutes Intravenous  Once 09/12/19 1315 09/12/19 1649     Subjective  The patient is resting comfortably in bed. No new complaints.  Objective   Vitals:  Vitals:   09/15/19 1242 09/15/19 1349  BP: 120/63   Pulse: 88 92  Resp: 18 18  Temp: 98.7 F (37.1 C)   SpO2: 98% 90%   Exam:  Constitutional:  The patient is sleeping soundly. She is easily rousable. No acute distress. Respiratory:   No increased work of breathing.  No wheezes, rales, or rhonchi  No tactile fremitus Cardiovascular:   Regular rate and rhythm  No murmurs, ectopy, or gallups.  No lateral PMI. No thrills. Abdomen:   Abdomen is soft, non-tender, non-distended  No hernias, masses, or  organomegaly  Normoactive bowel sounds.  Musculoskeletal:   No cyanosis, clubbing, or edema Skin:   No rashes, lesions, ulcers  palpation of skin: no induration or nodules Neurologic:   CN 2-12 intact  Sensation all 4 extremities intact Psychiatric:   Mental status o Mood, affect appropriate o Orientation to person, place, time   judgment and insight appear intact  I have personally reviewed the following:   Today's Data   Vitals, CBC, CMP  Scheduled Meds:  amitriptyline  75 mg Oral QHS   docusate sodium  100 mg Oral Daily   enoxaparin (LOVENOX) injection  40 mg Subcutaneous Q24H   gabapentin  300 mg Oral QHS   ipratropium-albuterol  3  mL Nebulization QID   levothyroxine  25 mcg Oral Q0600   Melatonin  5 mg Oral Daily   metoprolol succinate  50 mg Oral Daily   pantoprazole  40 mg Oral Daily   polyethylene glycol  17 g Oral Daily   vitamin B-12  1,000 mcg Oral Daily   Continuous Infusions:  azithromycin Stopped (09/14/19 1816)   cefTRIAXone (ROCEPHIN)  IV Stopped (09/14/19 1223)    Active Problems:   Pneumonia   Multiple closed fractures of pelvis (HCC)   Bone lesion   Lung mass   Left hip pain   LOS: 3 days   A & P   Pneumonia: Right lower lobe. Possibly due to aspiration. Will have SLP evaluate swallow. The patient is receiving IV Rocephin and azithromycin at this time.  Right infra-hilar mass: Pulmonology has been consulted as it appears that this will be amenable to biopsy via bronchoscopy.   UTI/pyelonephritis: Pt has left flank pain and dysuria. Urine culture has grown out over 100K conolis of aerococcus urinae. IV rocephin should be sufficient to cover most pathogens at this time.   Multiple pelvic fractures on CT of the pelvis. It demonstrates: acute to subacute fractures of the bilateral sacral ala, S1 and S4 vertebral bodies, right parasymphyseal pubic bone, and left inferior pubic ramus. Cortical irregularity and indistinctness of the right parasymphyseal pubic bone and left inferior pubic ramus. Cortical irregularity and destruction of the right anterior iliac wing with small adjacent soft tissue mass. Small lytic lesion in the left inferior sacral ala. Possible acute to subacute small corner fracture of the L5 anterior inferior endplate. Chronic appearing moderate L1 superior endplate compression deformity, new since 2017. Right lower lobe pneumonia. Trace right pleural effusion. Mild asymmetric enlargement of the right ovary, increased in size since 2017. Non-emergent outpatient pelvic ultrasound suggested for further evaluation. New indeterminate 1.6 cm left adrenal nodule. In light of the bony  pelvis findings, metastatic disease is not excluded. 9 mm calculus in the right renal pelvis. No hydronephrosis.Aortic atherosclerosis (ICD10-I70.0). Orthopedic surgery has been consulted from the ED to help with management. SPEP and UPEP have been ordered to investigate possible multiple myeloma. Ca 19-9 has been ordered to investigate possible ovarian cancer. I will discuss with patient and family how aggressive they wish to be in investigating the metastatic potential of some of these abnormalities.  Left hip pain: Pain control. PT/OT with the guidance of orthopedic surgery.  HTN: Continue metoprolol as at home. Monitor.  Peripheral neuropathy: Continue neurontin as at home.  Hypothyroidism: Continue Synthroid as at home.   I have seen and examined this patient myself. I have spent 30 minutes in her evaluation and care.  CODE STATUS: Full Code DVT Prophylaxis: Lovenox Family Communication: I have discussed this patient with her  niece this morning. All questions answered to the best of my ability. Disposition: med/surg bed.  Yanai Hobson, DO [facility] Direct contact: see [email or web address]  7PM-7AM contact night coverage as above 09/14/2019, 11:37 PM  LOS: 1 day

## 2019-09-16 ENCOUNTER — Encounter
Admission: EM | Disposition: A | Discharge status: Skilled Nursing Facility | Payer: Self-pay | Source: Home / Self Care | Attending: Internal Medicine

## 2019-09-16 ENCOUNTER — Inpatient Hospital Stay: Payer: Medicare Other

## 2019-09-16 ENCOUNTER — Encounter: Payer: Self-pay | Admitting: *Deleted

## 2019-09-16 ENCOUNTER — Inpatient Hospital Stay: Payer: Medicare Other | Admitting: Anesthesiology

## 2019-09-16 ENCOUNTER — Other Ambulatory Visit: Payer: Self-pay | Admitting: Primary Care

## 2019-09-16 DIAGNOSIS — Z7189 Other specified counseling: Secondary | ICD-10-CM

## 2019-09-16 DIAGNOSIS — I10 Essential (primary) hypertension: Secondary | ICD-10-CM

## 2019-09-16 DIAGNOSIS — Z515 Encounter for palliative care: Secondary | ICD-10-CM

## 2019-09-16 HISTORY — PX: SACROPLASTY: SHX6797

## 2019-09-16 LAB — MULTIPLE MYELOMA PANEL, SERUM
Albumin SerPl Elph-Mcnc: 2.8 g/dL — ABNORMAL LOW (ref 2.9–4.4)
Albumin/Glob SerPl: 1 (ref 0.7–1.7)
Alpha 1: 0.4 g/dL (ref 0.0–0.4)
Alpha2 Glob SerPl Elph-Mcnc: 1 g/dL (ref 0.4–1.0)
B-Globulin SerPl Elph-Mcnc: 1 g/dL (ref 0.7–1.3)
Gamma Glob SerPl Elph-Mcnc: 0.5 g/dL (ref 0.4–1.8)
Globulin, Total: 2.9 g/dL (ref 2.2–3.9)
IgA: 220 mg/dL (ref 64–422)
IgG (Immunoglobin G), Serum: 647 mg/dL (ref 586–1602)
IgM (Immunoglobulin M), Srm: 73 mg/dL (ref 26–217)
Total Protein ELP: 5.7 g/dL — ABNORMAL LOW (ref 6.0–8.5)

## 2019-09-16 SURGERY — SACROPLASTY
Anesthesia: General | Site: Back

## 2019-09-16 MED ORDER — ALBUTEROL SULFATE (2.5 MG/3ML) 0.083% IN NEBU: 2.5000 mg | INHALATION_SOLUTION | RESPIRATORY_TRACT | Status: DC | PRN

## 2019-09-16 MED ORDER — PROPOFOL 500 MG/50ML IV EMUL
INTRAVENOUS | Status: DC | PRN
  Administered 2019-09-16: 25 ug/kg/min via INTRAVENOUS

## 2019-09-16 MED ORDER — MIDAZOLAM HCL 2 MG/2ML IJ SOLN
INTRAMUSCULAR | Status: AC
  Filled 2019-09-16: qty 2

## 2019-09-16 MED ORDER — HYDRALAZINE HCL 20 MG/ML IJ SOLN
10.0000 mg | Freq: Four times a day (QID) | INTRAMUSCULAR | Status: DC | PRN
  Filled 2019-09-16: qty 0.5

## 2019-09-16 MED ORDER — LIDOCAINE HCL (PF) 1 % IJ SOLN
INTRAMUSCULAR | Status: AC
  Filled 2019-09-16: qty 30

## 2019-09-16 MED ORDER — FENTANYL CITRATE (PF) 100 MCG/2ML IJ SOLN
INTRAMUSCULAR | Status: DC | PRN
  Administered 2019-09-16 (×4): 25 ug via INTRAVENOUS

## 2019-09-16 MED ORDER — PROPOFOL 10 MG/ML IV BOLUS
INTRAVENOUS | Status: DC | PRN
  Administered 2019-09-16: 20 mg via INTRAVENOUS
  Administered 2019-09-16: 10 mg via INTRAVENOUS

## 2019-09-16 MED ORDER — BUPIVACAINE-EPINEPHRINE (PF) 0.5% -1:200000 IJ SOLN
INTRAMUSCULAR | Status: DC | PRN
  Administered 2019-09-16: 30 mL via PERINEURAL

## 2019-09-16 MED ORDER — FENTANYL CITRATE (PF) 100 MCG/2ML IJ SOLN: 25.0000 ug | INTRAMUSCULAR | Status: DC | PRN

## 2019-09-16 MED ORDER — BUPIVACAINE HCL (PF) 0.5 % IJ SOLN
INTRAMUSCULAR | Status: AC
  Filled 2019-09-16: qty 30

## 2019-09-16 MED ORDER — FENTANYL CITRATE (PF) 100 MCG/2ML IJ SOLN
INTRAMUSCULAR | Status: AC
  Filled 2019-09-16: qty 2

## 2019-09-16 MED ORDER — IPRATROPIUM-ALBUTEROL 0.5-2.5 (3) MG/3ML IN SOLN
3.0000 mL | Freq: Two times a day (BID) | RESPIRATORY_TRACT | Status: DC
  Administered 2019-09-16 – 2019-09-19 (×6): 3 mL via RESPIRATORY_TRACT
  Filled 2019-09-16 (×6): qty 3

## 2019-09-16 MED ORDER — TRAZODONE HCL 50 MG PO TABS
50.0000 mg | ORAL_TABLET | Freq: Every evening | ORAL | Status: DC | PRN
  Administered 2019-09-16 – 2019-09-18 (×3): 50 mg via ORAL
  Filled 2019-09-16 (×3): qty 1

## 2019-09-16 MED ORDER — MIDAZOLAM HCL 2 MG/2ML IJ SOLN
INTRAMUSCULAR | Status: DC | PRN
  Administered 2019-09-16 (×2): 0.5 mg via INTRAVENOUS
  Administered 2019-09-16: .5 mg via INTRAVENOUS
  Administered 2019-09-16: 0.5 mg via INTRAVENOUS

## 2019-09-16 MED ORDER — LACTATED RINGERS IV SOLN
INTRAVENOUS | Status: DC | PRN
  Administered 2019-09-16: 14:00:00 via INTRAVENOUS

## 2019-09-16 MED ORDER — LIDOCAINE HCL (PF) 1 % IJ SOLN
INTRAMUSCULAR | Status: DC | PRN
  Administered 2019-09-16: 20 mL

## 2019-09-16 MED ORDER — ONDANSETRON HCL 4 MG/2ML IJ SOLN: 4.0000 mg | Freq: Once | INTRAMUSCULAR | Status: DC | PRN

## 2019-09-16 SURGICAL SUPPLY — 20 items
CEMENT KYPHON CX01A KIT/MIXER (Cement) ×3 IMPLANT
COVER WAND RF STERILE (DRAPES) ×3 IMPLANT
DERMABOND ADVANCED (GAUZE/BANDAGES/DRESSINGS) ×4
DERMABOND ADVANCED .7 DNX12 (GAUZE/BANDAGES/DRESSINGS) ×2 IMPLANT
DEVICE BIOPSY BONE KYPH (INSTRUMENTS) ×3 IMPLANT
DRAPE C-ARM XRAY 36X54 (DRAPES) ×3 IMPLANT
DRSG MEPILEX SACRM 8.7X9.8 (GAUZE/BANDAGES/DRESSINGS) ×3 IMPLANT
DURAPREP 26ML APPLICATOR (WOUND CARE) ×3 IMPLANT
GLOVE SURG SYN 9.0  PF PI (GLOVE) ×2
GLOVE SURG SYN 9.0 PF PI (GLOVE) ×1 IMPLANT
GOWN SRG 2XL LVL 4 RGLN SLV (GOWNS) ×1 IMPLANT
GOWN STRL NON-REIN 2XL LVL4 (GOWNS) ×2
GOWN STRL REUS W/ TWL LRG LVL3 (GOWN DISPOSABLE) ×1 IMPLANT
GOWN STRL REUS W/TWL LRG LVL3 (GOWN DISPOSABLE) ×2
KIT OSTEOCOOL BONE ACCESS 10G (MISCELLANEOUS) ×9 IMPLANT
KIT TURNOVER KIT A (KITS) ×3 IMPLANT
PACK KYPHOPLASTY (MISCELLANEOUS) ×3 IMPLANT
SYS CARTRIDGE BONE CEMENT 8ML (SYSTAGENIX WOUND MANAGEMENT) ×3
SYSTEM CARTRIDG BONE CEMNT 8ML (SYSTAGENIX WOUND MANAGEMENT) ×1 IMPLANT
SYSTEM GUN BONE FILLER SZ2 (MISCELLANEOUS) ×3 IMPLANT

## 2019-09-16 NOTE — Op Note (Signed)
09/16/2019  3:17 PM  PATIENT:  Brittany Hughes  85 y.o. female  PRE-OPERATIVE DIAGNOSIS: Insufficiency fracture with metastatic disease sacrum  POST-OPERATIVE DIAGNOSIS: Same  PROCEDURE:  Procedure(s): SACROPLASTY (N/A)  SURGEON: Laurene Footman, MD  ASSISTANTS: None  ANESTHESIA:   local and MAC  EBL:  Total I/O In: 400 [I.V.:400] Out: 152 [Urine:150; Blood:2]  BLOOD ADMINISTERED:none  DRAINS: none   LOCAL MEDICATIONS USED:  MARCAINE    and XYLOCAINE   SPECIMEN:  Source of Specimen:  Sacrum  DISPOSITION OF SPECIMEN:  PATHOLOGY  COUNTS:  YES  TOURNIQUET:  * No tourniquets in log *  IMPLANTS: Bone cement  DICTATION: .Dragon Dictation  patient was brought to the operating room and after adequate sedation was given she is placed prone.  C arm was brought in in both AP and lateral planes and good visualization of the sacrum was obtained.  After patient identification and timeout procedures were completed, local anesthetic was infiltrated subcutaneously with 10 cc of Xylocaine in place.  The low back was then prepped and draped in the usual sterile fashion and after repeat timeout procedure was carried out spinal needles were placed down to the sacrum on the left and right sides with approximately 20 cc of a 50-50 mix of Xylocaine and half percent Sensorcaine with epinephrine.  After allowing that to set small incisions were made and the trocar advanced in to the sacrum between the neuroforamen and SI joints.  On the left side a biopsy was obtained since no about pathologic diagnosis been obtained yet. After these have been placed in the appropriate positions drilling was carried out followed by mixing of cement.  The cement was a probe consistency approximately 1-1/2 cc on the right and 2 and half cc on the left were placed without extravasation into the neuroforamen or sacroiliac joint.  A small amount of cement did come out posteriorly at the superior aspect of the sacrum towards L5  The needles were withdrawn slowly to try to get some of the cement down to the S2 level which is also affected.  After adequate cementation and the cement being set the trochars removed and the wounds closed with Dermabond followed by Band-Aid  PLAN OF CARE: Continue as inpatient  PATIENT DISPOSITION:  PACU - hemodynamically stable.

## 2019-09-16 NOTE — Progress Notes (Signed)
PROGRESS NOTE  Brittany Hughes:[id number] DOB: September 26, 1928 DOA: 09/12/2019 PCP: Doreene Nest, NP  Brief History   Unable to walk. Pt with progressively worsening pain in her groin with walking in the last week. Also shortness of breath and dysuria.  The patient is a 85 yr old woman who lives at home on her home with help from her niece and others among her "people", as she calls them, that help her. The patient has a past medical history significant for DVT of the right lower extremity, GERD, Hyperlipidemia, Hypertension, Mild cognitive impairment, osteoporosis, seizures, Skin cancer of her left foot. She states that she does not drink alcohol, and she quit smoking one year ago.  In the ED the patient is found to have multiple pelvic fractures. Some of these appear to be related to osteopenia. Some of these are suspicious for possible multiple myeloma or metastasis. She is also found to have a right lower lobe pneumonia and UTI. There is also a left pelvic mass and a right lower lobe mass.  The patient has been admitted to a medical bed. This patient is receiving IV antibiotics. She is receiving pain control. Consults have been requested for orthopedic surgery and oncology. I appreciate their help.  I have consulted pulmonology for biopsy via bronchoscopy.  However, the patient's POA who is her niece has decided not to pursue invasive procedures to obtain a diagnosis. She prefers that the patient just be able to be mobile and comfortably. The patient has been evaluated by orthopedic surgery and the plan is for sacroplasty later this week. PT and OT have also been consulted. Consultants   Orthopedic surgery  Oncology  Pulmonology  Procedures   None  Antibiotics   Anti-infectives (From admission, onward)   Start     Dose/Rate Route Frequency Ordered Stop   09/16/19 1500  ceFAZolin (ANCEF) IVPB 1 g/50 mL premix     1 g 100 mL/hr over 30 Minutes Intravenous  Once 09/15/19 1502       09/14/19 1400  amoxicillin-clavulanate (AUGMENTIN) 500-125 MG per tablet 500 mg  Status:  Discontinued     1 tablet Oral Every 8 hours 09/14/19 1135 09/14/19 1135   09/14/19 1200  cefTRIAXone (ROCEPHIN) 1 g in sodium chloride 0.9 % 100 mL IVPB     1 g 200 mL/hr over 30 Minutes Intravenous Every 24 hours 09/14/19 1135     09/13/19 1700  azithromycin (ZITHROMAX) 500 mg in sodium chloride 0.9 % 250 mL IVPB     500 mg 250 mL/hr over 60 Minutes Intravenous Every 24 hours 09/12/19 1412 09/18/19 1659   09/13/19 1600  cefTRIAXone (ROCEPHIN) 1 g in sodium chloride 0.9 % 100 mL IVPB  Status:  Discontinued     1 g 200 mL/hr over 30 Minutes Intravenous Every 24 hours 09/12/19 1412 09/14/19 1135   09/12/19 1330  azithromycin (ZITHROMAX) 500 mg in sodium chloride 0.9 % 250 mL IVPB     500 mg 250 mL/hr over 60 Minutes Intravenous  Once 09/12/19 1315 09/12/19 1752   09/12/19 1330  cefTRIAXone (ROCEPHIN) 1 g in sodium chloride 0.9 % 100 mL IVPB     1 g 200 mL/hr over 30 Minutes Intravenous  Once 09/12/19 1315 09/12/19 1649     Subjective  The patient is resting comfortably in bed. No new complaints.  Objective   Vitals:  Vitals:   09/16/19 1930 09/16/19 2014  BP:  (!) 170/61  Pulse:  91  Resp:  Temp:  98.3 F (36.8 C)  SpO2: 98% 97%   Exam:  Constitutional:  The patient is awake, alert, and oriented x 3.  No acute distress. Respiratory:   No increased work of breathing.  No wheezes, rales, or rhonchi  No tactile fremitus Cardiovascular:   Regular rate and rhythm  No murmurs, ectopy, or gallups.  No lateral PMI. No thrills. Abdomen:   Abdomen is soft, non-tender, non-distended  No hernias, masses, or organomegaly  Normoactive bowel sounds.  Musculoskeletal:   No cyanosis, clubbing, or edema Skin:   No rashes, lesions, ulcers  palpation of skin: no induration or nodules Neurologic:   CN 2-12 intact  Sensation all 4 extremities intact Psychiatric:   Mental  status o Mood, affect appropriate o Orientation to person, place, time   judgment and insight appear intact  I have personally reviewed the following:   Today's Data   Vitals, CBC, CMP  Scheduled Meds:  amitriptyline  75 mg Oral QHS   docusate sodium  100 mg Oral Daily   gabapentin  300 mg Oral QHS   ipratropium-albuterol  3 mL Nebulization BID   levothyroxine  25 mcg Oral Q0600   Melatonin  5 mg Oral Daily   metoprolol succinate  50 mg Oral Daily   pantoprazole  40 mg Oral Daily   polyethylene glycol  17 g Oral Daily   vitamin B-12  1,000 mcg Oral Daily   Continuous Infusions:  azithromycin 500 mg (09/15/19 1740)    ceFAZolin (ANCEF) IV     cefTRIAXone (ROCEPHIN)  IV 1 g (09/16/19 1249)    Active Problems:   Pneumonia   Multiple closed fractures of pelvis (HCC)   Bone lesion   Lung mass   Left hip pain   LOS: 4 days   A & P   Pneumonia: Right lower lobe. Possibly due to aspiration. Will have SLP evaluate swallow. The patient is receiving IV Rocephin and azithromycin at this time.  Right infra-hilar mass: Pulmonology has been consulted as it appears that this will be amenable to biopsy via bronchoscopy. However, the patient's niece has decided not to pursue invasive procedures to obtain a diagnosis. Palliative care has been consulted.  UTI/pyelonephritis: Pt has left flank pain and dysuria. Urine culture has grown out over 100K conolis of aerococcus urinae. IV rocephin should be sufficient to cover most pathogens at this time.   Multiple pelvic fractures on CT of the pelvis. It demonstrates: acute to subacute fractures of the bilateral sacral ala, S1 and S4 vertebral bodies, right parasymphyseal pubic bone, and left inferior pubic ramus. Cortical irregularity and indistinctness of the right parasymphyseal pubic bone and left inferior pubic ramus. Cortical irregularity and destruction of the right anterior iliac wing with small adjacent soft tissue mass.  Small lytic lesion in the left inferior sacral ala. Possible acute to subacute small corner fracture of the L5 anterior inferior endplate. Chronic appearing moderate L1 superior endplate compression deformity, new since 2017. Right lower lobe pneumonia. Trace right pleural effusion. Mild asymmetric enlargement of the right ovary, increased in size since 2017. Non-emergent outpatient pelvic ultrasound suggested for further evaluation. New indeterminate 1.6 cm left adrenal nodule. In light of the bony pelvis findings, metastatic disease is not excluded. 9 mm calculus in the right renal pelvis. No hydronephrosis.Aortic atherosclerosis (ICD10-I70.0). Orthopedic surgery has been consulted from the ED to help with management. SPEP and UPEP have been ordered to investigate possible multiple myeloma. Ca 19-9 has been ordered to investigate possible  ovarian cancer. I will discuss with patient and family how aggressive they wish to be in investigating the metastatic potential of some of these abnormalities. The patient will undergo sacroplasty in order to afford her more comfort and mobility.  Left hip pain: Pain control. PT/OT with the guidance of orthopedic surgery.  HTN: Continue metoprolol as at home. Monitor.  Peripheral neuropathy: Continue neurontin as at home.  Hypothyroidism: Continue Synthroid as at home.   I have seen and examined this patient myself. I have spent 34 minutes in her evaluation and care.  CODE STATUS: Full Code DVT Prophylaxis: Lovenox Family Communication: I have discussed this patient with her niece this morning. All questions answered to the best of my ability. Disposition: med/surg bed.  Zack Crager, DO [facility] Direct contact: see [email or web address]  7PM-7AM contact night coverage as above 09/16/2019, 8:40 PM  LOS: 1 day

## 2019-09-16 NOTE — Anesthesia Post-op Follow-up Note (Signed)
Anesthesia QCDR form completed.        

## 2019-09-16 NOTE — Anesthesia Procedure Notes (Signed)
Date/Time: 09/16/2019 2:56 PM Performed by: Nelda Marseille, CRNA Pre-anesthesia Checklist: Patient identified, Emergency Drugs available, Suction available, Patient being monitored and Timeout performed Oxygen Delivery Method: Nasal cannula

## 2019-09-16 NOTE — Anesthesia Preprocedure Evaluation (Signed)
Anesthesia Evaluation  Patient identified by MRN, date of birth, ID band Patient awake    Reviewed: Allergy & Precautions, NPO status , Patient's Chart, lab work & pertinent test results  History of Anesthesia Complications Negative for: history of anesthetic complications  Airway Mallampati: III       Dental   Pulmonary neg sleep apnea, neg COPD, former smoker,           Cardiovascular hypertension, Pt. on medications      Neuro/Psych Anxiety Depression Dementia    GI/Hepatic Neg liver ROS, GERD  ,  Endo/Other  neg diabetesHypothyroidism   Renal/GU Renal InsufficiencyRenal disease (stones)     Musculoskeletal   Abdominal   Peds  Hematology   Anesthesia Other Findings   Reproductive/Obstetrics                             Anesthesia Physical Anesthesia Plan  ASA: III  Anesthesia Plan: General   Post-op Pain Management:    Induction: Intravenous  PONV Risk Score and Plan: 3 and TIVA, Propofol infusion and Ondansetron  Airway Management Planned:   Additional Equipment:   Intra-op Plan:   Post-operative Plan:   Informed Consent: I have reviewed the patients History and Physical, chart, labs and discussed the procedure including the risks, benefits and alternatives for the proposed anesthesia with the patient or authorized representative who has indicated his/her understanding and acceptance.       Plan Discussed with:   Anesthesia Plan Comments:         Anesthesia Quick Evaluation

## 2019-09-16 NOTE — Progress Notes (Signed)
Patient to go to the OR today for sacral plasty and try to alleviate her pain and allow for mobilization.  Discussed risk benefits possible complications yesterday with her niece.

## 2019-09-16 NOTE — TOC Initial Note (Signed)
Transition of Care [facility]) - Initial/Assessment Note    Patient Details  Name: Brittany Hughes MRN: [id number] Date of Birth: 02-05-1928  Transition of [facility]) CM/SW Contact:    Beverly Sessions, RN Phone Number: 09/16/2019, 1:56 PM  Clinical Narrative:                 Patient admitted from home with PNA.  Patient lives at home alone.  Patient alert and oriented to person and place.  Discussed PT recommendations of SNF.  Patient agreeable if the goal is for rehab.  Patient requested I speak with her niece.   RNCN spoke with niece by phone.  States that patient does live at home alone.  Mobile with RW.  Patient also has a BSC, and shower seat.  Niece, and neighbor check on the patient daily.   PCP Carlis Abbott - niece provides transportation to appointments  Patient to have sacroplasty today  Palliative consult pending  PT recommending SNF.    RNCM discussed potential discharge dispositions pending recommendations by medical team.    -SNF with palliative  - home with hospice  - hospice home  Niece agreeable for bed search to be initiated for SNF Niece states that if it is determined that patient should return home with hospice services, there is no one that can stay with the patient 24/7, and they are not able to private pay for care givers   Patient has existing PASRR Fl2 sent for signature Bed search initiated.    RNCM will follow.   Expected Discharge Plan: Skilled Nursing Facility Barriers to Discharge: Continued Medical Work up   Patient Goals and CMS Choice   CMS Medicare.gov Compare Post Acute Care list provided to:: Patient Choice offered to / list presented to : Patient  Expected Discharge Plan and Services Expected Discharge Plan: [place]   Discharge Planning Services: CM Consult Post Acute Care Choice: [place] arrangements for the past 2 months: Single Family Home                           HH Arranged: RN, PT, Nurse's  Aide [place] Agency: [facility] ([place]) Date [place]: 09/13/19 Time [place]: 71 Representative spoke with at [place]: Corene Cornea  Prior Living Arrangements/Services Living arrangements for the past 2 months: [place] with:: Self Patient language and need for interpreter reviewed:: Yes Do you feel safe going back to the place where you live?: Yes      Need for Family Participation in Patient Care: Yes (Comment) Care giver support system in place?: Yes (comment) Current home services: DME Criminal Activity/Legal Involvement Pertinent to Current Situation/Hospitalization: No - Comment as needed  Activities of Daily Living Home Assistive Devices/Equipment: Environmental consultant (specify type), Shower chair with back ADL Screening (condition at time of admission) Patient's cognitive ability adequate to safely complete daily activities?: Yes Is the patient deaf or have difficulty hearing?: No Does the patient have difficulty seeing, even when wearing glasses/contacts?: No Does the patient have difficulty concentrating, remembering, or making decisions?: No Patient able to express need for assistance with ADLs?: No Does the patient have difficulty dressing or bathing?: No Independently performs ADLs?: Yes (appropriate for developmental age) Does the patient have difficulty walking or climbing stairs?: Yes Weakness of Legs: Both Weakness of Arms/Hands: Both  Permission Sought/Granted  Emotional Assessment Appearance:: Appears stated age Attitude/Demeanor/Rapport: Gracious Affect (typically observed): Accepting Orientation: : Oriented to Self, Oriented to Place   Psych Involvement: No (comment)  Admission diagnosis:  Left hip pain [M25.552] Multiple closed fractures of pelvis, initial encounter ([place]) [S32.82XA] Urinary tract infection with hematuria, site unspecified [N39.0, R31.9] Community acquired pneumonia of right lower  lobe of lung [J18.9] Patient Active Problem List   Diagnosis Date Noted  . Multiple closed fractures of pelvis ([place])   . Bone lesion   . Lung mass   . Left hip pain   . Pneumonia 09/12/2019  . Hemorrhoids 02/15/2018  . Allergic rhinitis 02/15/2018  . Urinary incontinence 10/03/2017  . Abnormality of gait 06/20/2016  . Chronic constipation 04/18/2016  . Hyperglycemia 04/18/2016  . Anemia 04/18/2016  . Hypothyroidism 04/18/2016  . IBS (irritable bowel syndrome) 09/29/2014  . Essential hypertension 09/29/2014  . Neuropathy 09/29/2014  . Insomnia 09/29/2014  . Leg edema, left 04/07/2014  . Dementia, vascular, with depression ([place]) 03/11/2014  . GERD (gastroesophageal reflux disease) 01/06/2014  . Frequent falls 01/06/2014  . Hyperlipidemia with target LDL less than 100 11/12/2013  . Arthritis of left knee 11/12/2013  . Atrophy of vulva 02/18/2013  . Malunion and nonunion of fracture(733.8) 02/18/2013  . Vitamin D deficiency 02/18/2013  . Chronic kidney disease, stage III (moderate) 02/18/2013  . Other B-complex deficiencies 02/18/2013  . Tobacco use disorder 02/18/2013  . Other diseases of vocal cords 02/18/2013  . Anxiety and depression 02/18/2013  . Chronic pain syndrome 02/18/2013  . Deafness or hearing loss of type classifiable to 389.0 with type classifiable to 389.1 02/18/2013  . Occlusion and stenosis of carotid artery without mention of cerebral infarction 02/18/2013   PCP:  Pleas Koch, NP Pharmacy:   [id number] - [place], [address] [place] [address] Phone: [phone] Fax: [phone]  [id number] - [place] [place] [address] Phone: [phone] Fax: [phone]     Social Determinants of Health (SDOH) Interventions    Readmission Risk Interventions Readmission Risk Prevention Plan 09/13/2019  Post Dischage Appt Complete  Medication Screening  Complete  Transportation Screening Complete  Some recent data might be hidden

## 2019-09-16 NOTE — Progress Notes (Signed)
PT Cancellation Note  Patient Details Name: Brittany Hughes MRN: [id number] DOB: October 18, 1928   Cancelled Treatment:    Reason Eval/Treat Not Completed: Medical issues which prohibited therapy(Per chart review, patient scheduled for sacroplasty this date.  Will hold additional mobility until procedure complete and patient cleared for activity.)   Hatim Homann H. Owens Shark, PT, DPT, NCS 09/16/19, 11:13 AM [phone]

## 2019-09-16 NOTE — Transfer of Care (Signed)
Immediate Anesthesia Transfer of Care Note  Patient: Brittany Hughes  Procedure(s) Performed: SACROPLASTY (N/A Back)  Patient Location: PACU  Anesthesia Type:General  Level of Consciousness: awake, alert  and oriented  Airway & Oxygen Therapy: Patient Spontanous Breathing and Patient connected to nasal cannula oxygen  Post-op Assessment: Report given to RN and Post -op Vital signs reviewed and stable  Post vital signs: Reviewed and stable  Last Vitals:  Vitals Value Taken Time  BP    Temp    Pulse 109 09/16/19 1516  Resp 15 09/16/19 1516  SpO2 97 % 09/16/19 1516  Vitals shown include unvalidated device data.  Last Pain:  Vitals:   09/16/19 1323  TempSrc: Temporal  PainSc: 5          Complications: No apparent anesthesia complications

## 2019-09-16 NOTE — Progress Notes (Addendum)
   09/16/19 1823  Vitals  BP (!) 175/78  Notified MD Swayze via Amnion requested PRN waiting response. [phone] no response paged another MD that is here till 51. MD Arbutus Ped, told to call swing MD. Randol Kern paged MD Royal Piedra told to call, night NP or PA.paged J.kim MD see new orders

## 2019-09-16 NOTE — Consult Note (Signed)
Consultation Note Date: 09/16/2019   Patient Name: Brittany Hughes  DOB: [birth year]  MRN: [id number]  Age / Sex: 85 y.o., female  PCP: Pleas Koch, NP Referring Physician: Karie Kirks, DO  Reason for Consultation: Establishing goals of care  HPI/Patient Profile: Brittany Hughes is a 85 y.o. female with multiple medical problems, including hypertension, hyperlipidemia, diabetes, chronic kidney disease macular degeneration, gastroesophageal reflux disease, seizure disorder, and peripheral neuropathy who lives alone, has noted progressively worsening pain and weakness to her pelvis and both lower extremities, resulting in numerous falls over the past month or so.   Clinical Assessment and Goals of Care: Patient is resting in bed with friend Brittany Hughes at bedside. Brittany Hughes helps her answer some of the questions and helps patient with word finding. [name] is widowed, and her son is deceased. She lived at [facility] until her husband died, and she moved back to the area. She has a sister, but states she has HPOA papers for Brittany Hughes, her niece by marriage to be her HPOA and POA.   At home, she lives alone but has friends that come in to check on her frequently. She has used a walker for the past couple of years. She does ADL's herself. Her friends cook and clean and get her groceries. She has meals on wheels. She has not driven in 3 years. She enjoys playing bingo.    We discussed her diagnosis, prognosis, GOC, EOL wishes disposition and options.  A detailed discussion was had today regarding advanced directives.  Concepts specific to code status, artifical feeding and hydration, IV antibiotics and rehospitalization were discussed.  The difference between an aggressive medical intervention path and a comfort care path was discussed.  Values and goals of care important to patient and family were attempted to be elicited.   Discussed limitations of medical interventions to prolong quality of life in some situations and discussed the concept of human mortality.  [name] states having her circle of friends and her independence is most important to her. She is clear she does not want CPR for cardiopulmonary arrest. She would not want to be placed on a ventilator for respiratory decline. She would not want a feeding tube. She understands the concerns for cancer.   Discussed this with her HPOA as well, which was encouraged by the patient. Brittany Hughes confirms the above. She is trying to determine care moving forward, and balance quality and quantity of life, as well as honoring her wishes to be able to have her friends to visit her. She understands there are current Covid restrictions which could change in nursing facilities.             SUMMARY OF RECOMMENDATIONS    For sacroplasty today. Will reach back out tomorrow after procedure.    Prognosis:   < 6 months with no oncologic intervention. Albumin 2.8.       Primary Diagnoses: Present on Admission: . Pneumonia   I have reviewed the medical record, interviewed the patient and family,  and examined the patient. The following aspects are pertinent.  Past Medical History:  Diagnosis Date  . Atrophy of vulva   . Baker's cyst 04/18/2016  . Carotid stenosis   . Chronic kidney disease   . Depression   . Dermatophytosis of nail   . DVT (deep venous thrombosis) ([place]) 01/06/2014   RLE  . Dysuria   . GERD (gastroesophageal reflux disease)   . Hyperglycemia 04/18/2016  . Hyperlipidemia   . Hyperpotassemia   . Hypertension   . Macular degeneration    "left eye; growth behind that" (01/06/2014)  . Mild cognitive impairment, so stated   . Nephrolithiasis   . Osteoporosis, unspecified   . Other B-complex deficiencies   . Other diseases of larynx   . Other voice and resonance disorders   . Reflux esophagitis   . Right wrist fracture 01/06/2014  . Seizures ([place])     "one, years ago" (01/06/2014)  . Unspecified hereditary and idiopathic peripheral neuropathy   . Unspecified malignant neoplasm of skin, site unspecified    left foot  . Unspecified urinary incontinence   . Vitamin deficiency    Social History   Socioeconomic History  . Marital status: Widowed    Spouse name: Not on file  . Number of children: Not on file  . Years of education: Not on file  . Highest education level: Not on file  Occupational History  . Not on file  Social Needs  . Financial resource strain: Not on file  . Food insecurity    Worry: Not on file    Inability: Not on file  . Transportation needs    Medical: Not on file    Non-medical: Not on file  Tobacco Use  . Smoking status: Former Smoker    Packs/day: 0.50    Years: 60.00    Pack years: 30.00    Types: Cigarettes    Quit date: 10/31/2011    Years since quitting: 7.8  . Smokeless tobacco: Never Used  Substance and Sexual Activity  . Alcohol use: No  . Drug use: No  . Sexual activity: Never  Lifestyle  . Physical activity    Days per week: Not on file    Minutes per session: Not on file  . Stress: Not on file  Relationships  . Social Herbalist on phone: Not on file    Gets together: Not on file    Attends religious service: Not on file    Active member of club or organization: Not on file    Attends meetings of clubs or organizations: Not on file    Relationship status: Not on file  Other Topics Concern  . Not on file  Social History Narrative  . Not on file   Family History  Problem Relation Age of Onset  . Heart disease Mother 74  . Kidney disease Father   . Diabetes Sister   . Heart disease Brother 34  . Heart attack Son 22  . Cancer Son        Throat  . Stroke Brother   . Cancer Brother   . Cancer Sister        Kidney   Scheduled Meds: . [MAR Hold] amitriptyline  75 mg Oral QHS  . [MAR Hold] docusate sodium  100 mg Oral Daily  . [MAR Hold] gabapentin  300 mg Oral  QHS  . [MAR Hold] ipratropium-albuterol  3 mL Nebulization BID  . [MAR Hold] levothyroxine  25  mcg Oral Q0600  . [MAR Hold] Melatonin  5 mg Oral Daily  . [MAR Hold] metoprolol succinate  50 mg Oral Daily  . [MAR Hold] pantoprazole  40 mg Oral Daily  . [MAR Hold] polyethylene glycol  17 g Oral Daily  . [MAR Hold] vitamin B-12  1,000 mcg Oral Daily   Continuous Infusions: . [MAR Hold] azithromycin 500 mg (09/15/19 1740)  . [MAR Hold]  ceFAZolin (ANCEF) IV    . [MAR Hold] cefTRIAXone (ROCEPHIN)  IV 1 g (09/16/19 1249)   PRN Meds:.[MAR Hold] acetaminophen **OR** [MAR Hold] acetaminophen, [MAR Hold] albuterol, [MAR Hold] bisacodyl, [MAR Hold] HYDROcodone-acetaminophen, [MAR Hold] ondansetron **OR** [MAR Hold] ondansetron (ZOFRAN) IV, [MAR Hold] promethazine, [MAR Hold] senna-docusate Medications Prior to Admission:  Prior to Admission medications   Medication Sig Start Date End Date Taking? Authorizing Provider  amitriptyline (ELAVIL) 75 MG tablet Take 1 tablet (75 mg total) by mouth at bedtime. For sleep. 09/03/19  Yes Pleas Koch, NP  antiseptic oral rinse (BIOTENE) LIQD 1 application by Mouth Rinse route every 4 (four) hours as needed for dry mouth.   Yes [provider]  cholecalciferol (VITAMIN D) 1000 units tablet Take 1,000 Units by mouth daily.   Yes [provider]  docusate sodium (COLACE) 100 MG capsule Take 100 mg by mouth daily.   Yes [provider]  levocetirizine (XYZAL) 5 MG tablet Take 1 tablet (5 mg total) by mouth every evening. For allergies. 08/18/19  Yes Pleas Koch, NP  levothyroxine (SYNTHROID) 25 MCG tablet 1 TAB EVERY MORNING ON AN EMPTY STOMACH WITH FULL GLASS OF WATER. NO FOOD/MEDS FOR 30 MINUTES. Patient taking differently: Take 25 mcg by mouth daily before breakfast.  06/25/19  Yes Pleas Koch, NP  Melatonin 3 MG TABS Take 1 tablet (3 mg total) by mouth daily. 09/29/14  Yes Blanchie Serve, MD  meloxicam (MOBIC) 7.5 MG  tablet Take 7.5 mg by mouth daily. 09/10/19  Yes [provider]  metoprolol succinate (TOPROL-XL) 50 MG 24 hr tablet TAKE 1 TABLET BY MOUTH EVERY DAY Patient taking differently: Take 50 mg by mouth daily.  06/25/19  Yes Pleas Koch, NP  omeprazole (PRILOSEC) 20 MG capsule TAKE 1 CAPSULE (20 MG TOTAL) BY MOUTH DAILY. FOR ACID REFLUX Patient taking differently: Take 20 mg by mouth daily.  12/31/18  Yes Pleas Koch, NP  Polyethyl Glycol-Propyl Glycol (SYSTANE) 0.4-0.3 % SOLN Apply 1 drop to eye daily as needed (for dry eyes).   Yes [provider]  promethazine (PHENERGAN) 12.5 MG tablet TAKE 1 TABLET BY MOUTH EVERY 12 HOURS AS NEEDED FOR NAUSEA OR VOMITING Patient taking differently: Take 12.5 mg by mouth every 12 (twelve) hours as needed for nausea or vomiting.  06/25/19  Yes Pleas Koch, NP  traMADol (ULTRAM) 50 MG tablet Take 50 mg by mouth every 6 (six) hours as needed for pain. 09/10/19  Yes [provider]  vitamin B-12 (CYANOCOBALAMIN) 1000 MCG tablet Take 1,000 mcg by mouth daily.   Yes [provider]  AMITIZA 24 MCG capsule TAKE 1 CAPSULE (24 MCG TOTAL) BY MOUTH 2 (TWO) TIMES DAILY WITH A MEAL. 09/16/19   Pleas Koch, NP  gabapentin (NEURONTIN) 300 MG capsule TAKE 1 CAPSULE BY MOUTH EVERYDAY AT BEDTIME 09/16/19   Pleas Koch, NP  hydrocortisone (ANUSOL-HC) 25 MG suppository Place 1 suppository (25 mg total) rectally 2 (two) times daily. Patient not taking: Reported on 08/21/2018 06/28/18   Elsie Stain  S, MD  lisinopril (ZESTRIL) 5 MG tablet TAKE 1 TABLET BY MOUTH EVERY DAY 09/16/19   Pleas Koch, NP   Allergies  Allergen Reactions  . Codeine Other (See Comments) and Nausea And Vomiting    nausea  . Colace [Docusate Calcium] Nausea Only   Review of Systems  Musculoskeletal:       Pelvic pain    Physical Exam Pulmonary:     Effort: Pulmonary effort is normal.  Skin:    General: Skin is warm and dry.   Neurological:     Mental Status: She is alert.     Vital Signs: BP (!) 180/71   Pulse 98   Temp 98.8 F (37.1 C) (Temporal)   Resp 20   Ht 5\' 2"  (1.575 m)   Wt 68 kg   SpO2 98%   BMI 27.44 kg/m  Pain Scale: 0-10 POSS *See Group Information*: 1-Acceptable,Awake and alert Pain Score: 5    SpO2: SpO2: 98 % O2 Device:SpO2: 98 % O2 Flow Rate: .O2 Flow Rate (L/min): 2 L/min  IO: Intake/output summary:   Intake/Output Summary (Last 24 hours) at 09/16/2019 1355 Last data filed at 09/16/2019 1300 Gross per 24 hour  Intake 480 ml  Output 400 ml  Net 80 ml    LBM: Last BM Date: 09/14/19 Baseline Weight: Weight: 68 kg Most recent weight: Weight: 68 kg     Palliative Assessment/Data:     Time In: 1:00 Time Out: 2:10 Time Total: 70 min Greater than 50%  of this time was spent counseling and coordinating care related to the above assessment and plan.  Signed by: Asencion Gowda, NP   Please contact Palliative Medicine Team phone at [phone] for questions and concerns.  For individual provider: See Shea Evans

## 2019-09-16 NOTE — NC FL2 (Signed)
[place] LEVEL OF CARE SCREENING TOOL     IDENTIFICATION  Patient Name: Brittany Hughes Birthdate: June 18, 1928 Sex: female Admission Date (Current Location): 09/12/2019  [facility] and [place] Number:  Engineering geologist and Address:         Provider Number: [id number]  Attending Physician Name and Address:  Karie Kirks, DO  Relative Name and Phone Number:       Current Level of Care: Hospital Recommended Level of Care: [place] Prior Approval Number:    Date Approved/Denied:   PASRR Number: [id number]  Discharge Plan: SNF    Current Diagnoses: Patient Active Problem List   Diagnosis Date Noted  . Multiple closed fractures of pelvis ([place])   . Bone lesion   . Lung mass   . Left hip pain   . Pneumonia 09/12/2019  . Hemorrhoids 02/15/2018  . Allergic rhinitis 02/15/2018  . Urinary incontinence 10/03/2017  . Abnormality of gait 06/20/2016  . Chronic constipation 04/18/2016  . Hyperglycemia 04/18/2016  . Anemia 04/18/2016  . Hypothyroidism 04/18/2016  . IBS (irritable bowel syndrome) 09/29/2014  . Essential hypertension 09/29/2014  . Neuropathy 09/29/2014  . Insomnia 09/29/2014  . Leg edema, left 04/07/2014  . Dementia, vascular, with depression ([place]) 03/11/2014  . GERD (gastroesophageal reflux disease) 01/06/2014  . Frequent falls 01/06/2014  . Hyperlipidemia with target LDL less than 100 11/12/2013  . Arthritis of left knee 11/12/2013  . Atrophy of vulva 02/18/2013  . Malunion and nonunion of fracture(733.8) 02/18/2013  . Vitamin D deficiency 02/18/2013  . Chronic kidney disease, stage III (moderate) 02/18/2013  . Other B-complex deficiencies 02/18/2013  . Tobacco use disorder 02/18/2013  . Other diseases of vocal cords 02/18/2013  . Anxiety and depression 02/18/2013  . Chronic pain syndrome 02/18/2013  . Deafness or hearing loss of type classifiable to 389.0 with type classifiable to 389.1 02/18/2013  . Occlusion and  stenosis of carotid artery without mention of cerebral infarction 02/18/2013    Orientation RESPIRATION BLADDER Height & Weight     Self, Place  O2(2L acute) Incontinent, External catheter Weight: 68 kg Height:  5\' 2"  (157.5 cm)  BEHAVIORAL SYMPTOMS/MOOD NEUROLOGICAL BOWEL NUTRITION STATUS      Continent Diet(NPO for procedure.  Will advance prior to discharge)  AMBULATORY STATUS COMMUNICATION OF NEEDS Skin   Extensive Assist Verbally Normal                       Personal Care Assistance Level of Assistance              Functional Limitations Info  Sight          SPECIAL CARE FACTORS FREQUENCY  OT (By licensed OT), PT (By licensed PT)                    Contractures Contractures Info: Not present    Additional Factors Info  Code Status, Allergies Code Status Info: DNR Allergies Info: codeine, colace           Current Medications (09/16/2019):  This is the current hospital active medication list Current Facility-Administered Medications  Medication Dose Route Frequency Provider Last Rate Last Dose  . [MAR Hold] acetaminophen (TYLENOL) tablet 650 mg  650 mg Oral Q6H PRN Swayze, Ava, DO       Or  . [MAR Hold] acetaminophen (TYLENOL) suppository 650 mg  650 mg Rectal Q6H PRN Swayze, Ava, DO      . [MAR Hold]  albuterol (PROVENTIL) (2.5 MG/3ML) 0.083% nebulizer solution 2.5 mg  2.5 mg Nebulization Q4H PRN Swayze, Ava, DO      . [MAR Hold] amitriptyline (ELAVIL) tablet 75 mg  75 mg Oral QHS Swayze, Ava, DO   75 mg at 09/15/19 2104  . [MAR Hold] azithromycin (ZITHROMAX) 500 mg in sodium chloride 0.9 % 250 mL IVPB  500 mg Intravenous Q24H Swayze, Ava, DO 250 mL/hr at 09/15/19 1740 500 mg at 09/15/19 1740  . [MAR Hold] bisacodyl (DULCOLAX) suppository 10 mg  10 mg Rectal Daily PRN Swayze, Ava, DO      . [MAR Hold] ceFAZolin (ANCEF) IVPB 1 g/50 mL premix  1 g Intravenous Once Hessie Knows, MD      . Doug Sou Hold] cefTRIAXone (ROCEPHIN) 1 g in sodium chloride 0.9 %  100 mL IVPB  1 g Intravenous Q24H Swayze, Ava, DO 200 mL/hr at 09/16/19 1249 1 g at 09/16/19 1249  . [MAR Hold] docusate sodium (COLACE) capsule 100 mg  100 mg Oral Daily Swayze, Ava, DO   100 mg at 09/15/19 0905  . [MAR Hold] gabapentin (NEURONTIN) capsule 300 mg  300 mg Oral QHS Swayze, Ava, DO   300 mg at 09/15/19 2104  . [MAR Hold] HYDROcodone-acetaminophen (NORCO) 10-325 MG per tablet 1-2 tablet  1-2 tablet Oral Q4H PRN Swayze, Ava, DO   2 tablet at 09/16/19 3614  . [MAR Hold] ipratropium-albuterol (DUONEB) 0.5-2.5 (3) MG/3ML nebulizer solution 3 mL  3 mL Nebulization BID Swayze, Ava, DO      . [MAR Hold] levothyroxine (SYNTHROID) tablet 25 mcg  25 mcg Oral Q0600 Swayze, Ava, DO   25 mcg at 09/16/19 0510  . [MAR Hold] Melatonin TABS 5 mg  5 mg Oral Daily Swayze, Ava, DO   5 mg at 09/15/19 2104  . [MAR Hold] metoprolol succinate (TOPROL-XL) 24 hr tablet 50 mg  50 mg Oral Daily Swayze, Ava, DO   50 mg at 09/16/19 1016  . [MAR Hold] ondansetron (ZOFRAN) tablet 4 mg  4 mg Oral Q6H PRN Swayze, Ava, DO       Or  . [MAR Hold] ondansetron (ZOFRAN) injection 4 mg  4 mg Intravenous Q6H PRN Swayze, Ava, DO      . [MAR Hold] pantoprazole (PROTONIX) EC tablet 40 mg  40 mg Oral Daily Swayze, Ava, DO   40 mg at 09/16/19 1016  . [MAR Hold] polyethylene glycol (MIRALAX / GLYCOLAX) packet 17 g  17 g Oral Daily Swayze, Ava, DO   17 g at 09/15/19 0908  . [MAR Hold] promethazine (PHENERGAN) tablet 12.5 mg  12.5 mg Oral Q12H PRN Swayze, Ava, DO      . [MAR Hold] senna-docusate (Senokot-S) tablet 1 tablet  1 tablet Oral QHS PRN Swayze, Ava, DO   1 tablet at 09/14/19 2130  . [MAR Hold] vitamin B-12 (CYANOCOBALAMIN) tablet 1,000 mcg  1,000 mcg Oral Daily Swayze, Ava, DO   1,000 mcg at 09/15/19 4315     Discharge Medications: Please see discharge summary for a list of discharge medications.  Relevant Imaging Results:  Relevant Lab Results:   Additional Information ss [id number]  Beverly Sessions,  RN

## 2019-09-17 ENCOUNTER — Encounter: Payer: Self-pay | Admitting: Orthopedic Surgery

## 2019-09-17 DIAGNOSIS — R319 Hematuria, unspecified: Secondary | ICD-10-CM

## 2019-09-17 DIAGNOSIS — S3282XD Multiple fractures of pelvis without disruption of pelvic ring, subsequent encounter for fracture with routine healing: Secondary | ICD-10-CM

## 2019-09-17 DIAGNOSIS — T148XXA Other injury of unspecified body region, initial encounter: Secondary | ICD-10-CM

## 2019-09-17 LAB — CULTURE, BLOOD (ROUTINE X 2)
Culture: NO GROWTH
Culture: NO GROWTH
Special Requests: ADEQUATE
Specimen Description: ADEQUATE

## 2019-09-17 MED ORDER — AZITHROMYCIN 500 MG PO TABS
500.0000 mg | ORAL_TABLET | Freq: Every day | ORAL | Status: AC
  Administered 2019-09-17 – 2019-09-18 (×2): 500 mg via ORAL
  Filled 2019-09-17 (×2): qty 1

## 2019-09-17 MED ORDER — OXYCODONE HCL 5 MG PO TABS: 5.0000 mg | ORAL_TABLET | ORAL | Status: DC | PRN

## 2019-09-17 MED ORDER — ENOXAPARIN SODIUM 40 MG/0.4ML ~~LOC~~ SOLN
40.0000 mg | SUBCUTANEOUS | Status: DC
  Administered 2019-09-17 – 2019-09-18 (×2): 40 mg via SUBCUTANEOUS
  Filled 2019-09-17 (×2): qty 0.4

## 2019-09-17 MED ORDER — MORPHINE SULFATE (PF) 2 MG/ML IV SOLN
1.0000 mg | INTRAVENOUS | Status: DC | PRN
  Administered 2019-09-17: 2 mg via INTRAVENOUS
  Administered 2019-09-17: 1 mg via INTRAVENOUS
  Administered 2019-09-17 – 2019-09-18 (×2): 2 mg via INTRAVENOUS
  Filled 2019-09-17 (×4): qty 1

## 2019-09-17 NOTE — Progress Notes (Addendum)
PROGRESS NOTE  Brittany Hughes KZL:[id number] DOB: 16-Apr-1928 DOA: 09/12/2019 PCP: Pleas Koch, NP   LOS: 5 days   Brief Narrative / Interim history: 85 year old woman with hypertension, hypothyroidism, admitted to the hospital with progressive weakness, dysuria who also had a fall the week prior to admission.  She does have medical history of prior DVT, osteoporosis.  She lives alone with help from her nieces.  In the ED she was found to have multiple pelvic fractures, some of these appear to be related to osteopenia and others possibly suggesting multiple myeloma/metastatic disease.  She was also found to have a right lower lobe pneumonia as well as a urinary tract infection.  Imaging revealed a left pelvic mass in the right lower lobe mass.  Pulmonology was consulted for biopsy via bronchoscopy however the patient's niece (POA) decided not to pursue invasive procedures and prefers the patient to be mobile/comfortable.  Orthopedic surgery was consulted and patient underwent a sacroplasty on 09/16/2019.  Subjective / 24h Interval events: Patient yelling in pain, she is very uncomfortable in her lower back.  She denies any chest pain, denies any shortness of breath.  No abdominal pain, no nausea or vomiting.  She is intermittently confused regarding surgery and why she is here but can relate events from her past without difficulties  Assessment & Plan: Active Problems:   Pneumonia   Multiple closed fractures of pelvis ([place])   Bone lesion   Lung mass   Left hip pain   Principal Problem Right lower lobe pneumonia -Patient was started on ceftriaxone and azithromycin on 09/13/2019, continue for total of 7 days, respiratory status seems to be stable, she is afebrile and her leukocytosis is improving -Supportive care, encourage incentive spirometry and sitting in the bedside chair as much as possible during the daytime  Active Problems Urinary tract infection -Symptoms improved, urine  cultures with 10 to the fifth colonies of Aerococcus urinary, current antibiotic regimen should cover -Improving, afebrile, leukocytosis is getting better  Multiple pelvic fractures /sacral fracture -Status post sacral plasty by orthopedic surgery on 09/16/2019.  PT to evaluate, suspect she will need SNF level of care -Adjust pain medications as needed for her to be able to work with PT, added very low-dose IV morphine  Concern for metastatic disease -Per prior discussions by Dr. Benny Lennert and POA as well as pulmonology would not pursue aggressive work-up, she is a very poor candidate for systemic therapy  Hypothyroidism -continue Synthroid  Hypertension -Continue home metoprolol  Mild dementia -Stable   Scheduled Meds: . amitriptyline  75 mg Oral QHS  . docusate sodium  100 mg Oral Daily  . gabapentin  300 mg Oral QHS  . ipratropium-albuterol  3 mL Nebulization BID  . levothyroxine  25 mcg Oral Q0600  . Melatonin  5 mg Oral Daily  . metoprolol succinate  50 mg Oral Daily  . pantoprazole  40 mg Oral Daily  . polyethylene glycol  17 g Oral Daily  . vitamin B-12  1,000 mcg Oral Daily   Continuous Infusions: . azithromycin 500 mg (09/15/19 1740)  .  ceFAZolin (ANCEF) IV    . cefTRIAXone (ROCEPHIN)  IV 1 g (09/16/19 1249)   PRN Meds:.acetaminophen **OR** acetaminophen, albuterol, bisacodyl, hydrALAZINE, HYDROcodone-acetaminophen, ondansetron **OR** ondansetron (ZOFRAN) IV, promethazine, senna-docusate, traZODone  DVT prophylaxis: SCDs Code Status: DNR Family Communication: Discussed with patient's niece over the phone Disposition Plan: likely needs SNF  Consultants:  Orthopedic surgery, Dr. Rudene Christians  Procedures:  Sacroplasty 11/10  Microbiology  Blood cultures 09/12/2019-no growth, pending Urine cultures-10 to the fifth Aerococcus SARS-CoV-2 11/6-negative  Antimicrobials: Ceftriaxone/azithromycin 11/7 >>   Objective: Vitals:   09/16/19 1823 09/16/19 1930 09/16/19 2014  09/17/19 0509  BP: (!) 175/78  (!) 170/61 (!) 146/61  Pulse: 91  91 69  Resp: 18   18  Temp: 98 F (36.7 C)  98.3 F (36.8 C) 98.1 F (36.7 C)  TempSrc: Oral  Oral Oral  SpO2: 99% 98% 97% 97%  Weight:      Height:        Intake/Output Summary (Last 24 hours) at 09/17/2019 5366 Last data filed at 09/17/2019 0300 Gross per 24 hour  Intake 849.48 ml  Output 152 ml  Net 697.48 ml   Filed Weights   09/12/19 1140 09/16/19 1323  Weight: 68 kg 68 kg    Examination:  Constitutional: NAD Eyes: lids and conjunctivae normal, no scleral icterus ENMT: Mucous membranes are moist.  Neck: normal, supple Respiratory: clear to auscultation bilaterally, no wheezing, no crackles. Normal respiratory effort.  Cardiovascular: Regular rate and rhythm, no murmurs / rubs / gallops.  Abdomen: no tenderness. Bowel sounds positive.  Musculoskeletal: no clubbing / cyanosis.  Skin: no rashes Neurologic: CN 2-12 grossly intact. Strength 5/5 in all 4.  Psychiatric: Normal judgment and insight. Alert and oriented x 3. Normal mood.    Data Reviewed: I have independently reviewed following labs and imaging studies   CBC: Recent Labs  Lab 09/12/19 1202 09/13/19 0527 09/14/19 0635 09/14/19 1653  WBC 18.0* 12.7* 12.4* 11.8*  NEUTROABS  --   --  10.3*  --   HGB 10.3* 9.4* 9.8* 9.3*  HCT 32.9* 30.3* 31.1* 29.6*  MCV 81.4 82.8 80.2 80.0  PLT 200 171 198 440   Basic Metabolic Panel: Recent Labs  Lab 09/12/19 1202 09/13/19 0527 09/14/19 0635 09/15/19 0433  NA 137 140 138 139  K 4.3 4.1 3.9 3.7  CL 102 106 104 105  CO2 25 23 23 23   GLUCOSE 90 103* 112* 100*  BUN 28* 27* 22 21  CREATININE 1.40* 1.05* 0.92 0.98  CALCIUM 9.8 9.1 9.1 9.0   GFR: Estimated Creatinine Clearance: 35.2 mL/min (by C-G formula based on SCr of 0.98 mg/dL). Liver Function Tests: Recent Labs  Lab 09/12/19 1202 09/13/19 0527 09/14/19 0635  AST 19 14* 15  ALT 14 11 10   ALKPHOS 137* 117 126  BILITOT 0.7 0.5 0.7   PROT 7.0 5.9* 6.5  ALBUMIN 3.3* 2.8* 2.8*   Coagulation Profile: No results for input(s): INR, PROTIME in the last 168 hours. HbA1C: No results for input(s): HGBA1C in the last 72 hours. CBG: No results for input(s): GLUCAP in the last 168 hours.  Recent Results (from the past 240 hour(s))  Urine culture     Status: Abnormal   Collection Time: 09/12/19  1:13 PM   Specimen: Urine, Catheterized  Result Value Ref Range Status   Specimen Description   Final    URINE, CATHETERIZED Performed at [facility], [address], [place], [place] [address]    Special Requests   Final    NONE Performed at [facility], [address] [place], [place] [address]    Culture (A)  Final    >=100,000 COLONIES/mL AEROCOCCUS URINAE Standardized susceptibility testing for this organism is not available. Performed at [facility], [place] [address] [place], [place] [address]    Report Status 09/14/2019 FINAL  Final  Blood culture (routine x 2)  Status: None (Preliminary result)   Collection Time: 09/12/19  1:21 PM   Specimen: BLOOD  Result Value Ref Range Status   Specimen Description BLOOD Blood Culture adequate volume  Final   Special Requests   Final    BOTTLES DRAWN AEROBIC AND ANAEROBIC LEFT ANTECUBITAL   Culture   Final    NO GROWTH 4 DAYS Performed at [facility], [address] [place], [place] [address]    Report Status PENDING  Incomplete  SARS CORONAVIRUS 2 (TAT 6-24 HRS) Nasopharyngeal Nasopharyngeal Swab     Status: None   Collection Time: 09/12/19  2:21 PM   Specimen: Nasopharyngeal Swab  Result Value Ref Range Status   SARS Coronavirus 2 NEGATIVE NEGATIVE Final    Comment: (NOTE) SARS-CoV-2 target nucleic acids are NOT DETECTED. The SARS-CoV-2 RNA is generally detectable in upper and lower respiratory specimens during the acute phase of infection. Negative results do not preclude SARS-CoV-2 infection, do not rule out co-infections  with other pathogens, and should not be used as the sole basis for treatment or other patient management decisions. Negative results must be combined with clinical observations, patient history, and epidemiological information. The expected result is Negative. Fact Sheet for Patients: SugarRoll.be Fact Sheet for Healthcare Providers: https://www.woods-mathews.com/ This test is not yet approved or cleared by the [place] FDA and  has been authorized for detection and/or diagnosis of SARS-CoV-2 by FDA under an Emergency Use Authorization (EUA). This EUA will remain  in effect (meaning this test can be used) for the duration of the COVID-19 declaration under Section 56 4(b)(1) of the Act, 21 U.S.C. section 360bbb-3(b)(1), unless the authorization is terminated or revoked sooner. Performed at [facility], [place] [address], [place], [place] [address]   Blood culture (routine x 2)     Status: None (Preliminary result)   Collection Time: 09/12/19  2:48 PM   Specimen: BLOOD  Result Value Ref Range Status   Specimen Description BLOOD RIGHT ANTECUBITAL  Final   Special Requests   Final    BOTTLES DRAWN AEROBIC AND ANAEROBIC Blood Culture adequate volume   Culture   Final    NO GROWTH 4 DAYS Performed at [facility], [address] [place], [place] [address]    Report Status PENDING  Incomplete     Radiology Studies: Dg Lumbar Spine 2-3 Views  Result Date: 09/16/2019 CLINICAL DATA:  Sacroplasty. EXAM: LUMBAR SPINE - 2-3 VIEW; DG C-ARM 1-60 MIN Radiation exposure index: 10.10 mGy. COMPARISON:  None. FINDINGS: Single intraoperative fluoroscopic image was obtained of the sacrum. This demonstrates the patient be status post bilateral sacroplasty. IMPRESSION: Fluoroscopic guidance provided during bilateral sacroplasty. Electronically Signed   By: Marijo Conception M.D.   On: 09/16/2019 15:30   Dg C-arm 1-60 Min  Result Date:  09/16/2019 CLINICAL DATA:  Sacroplasty. EXAM: LUMBAR SPINE - 2-3 VIEW; DG C-ARM 1-60 MIN Radiation exposure index: 10.10 mGy. COMPARISON:  None. FINDINGS: Single intraoperative fluoroscopic image was obtained of the sacrum. This demonstrates the patient be status post bilateral sacroplasty. IMPRESSION: Fluoroscopic guidance provided during bilateral sacroplasty. Electronically Signed   By: Marijo Conception M.D.   On: 09/16/2019 15:30   Marzetta Board, MD, PhD [facility]  Contact via  [email or web address]

## 2019-09-17 NOTE — Anesthesia Postprocedure Evaluation (Signed)
Anesthesia Post Note  Patient: Brittany Hughes  Procedure(s) Performed: SACROPLASTY (N/A Back)  Patient location during evaluation: PACU Anesthesia Type: General Level of consciousness: awake and alert Pain management: pain level controlled Vital Signs Assessment: post-procedure vital signs reviewed and stable Respiratory status: spontaneous breathing and respiratory function stable Cardiovascular status: stable Anesthetic complications: no     Last Vitals:  Vitals:   09/16/19 2014 09/17/19 0509  BP: (!) 170/61 (!) 146/61  Pulse: 91 69  Resp:  18  Temp: 36.8 C 36.7 C  SpO2: 97% 97%    Last Pain:  Vitals:   09/17/19 0627  TempSrc:   PainSc: 10-Worst pain ever                 KEPHART,WILLIAM K

## 2019-09-17 NOTE — Progress Notes (Signed)
"  room 141 Brittany Hughes is screaming and crying out in pain. Im about to give her the PO pain med but can she have something for immediate" notified MD Cruzita Lederer

## 2019-09-17 NOTE — Evaluation (Signed)
Physical Therapy Evaluation Patient Details Name: Brittany Hughes MRN: [id number] DOB: 1928/02/07 Today's Date: 09/17/2019   History of Present Illness  85yo female admitted for inability to walk with progressively worsening pain in her groin with walking in the last week; admitted for management of multiple pelvic fractures (bilat sacral ala, S1 and S4, R parasymphaseal, L inferior pubic rami) attributed to myeloma/metastatic disease. Now status post sacroplasty (09/16/19) with orders to resume PT services as appropriate.  Additional masses noted on imaging-infrahilar, perihilar and L adrenal mass; oncology consulted and involved.  Past medical history significant for DVT of the right lower extremity, GERD, Hyperlipidemia, Hypertension, Mild cognitive impairment, osteoporosis, seizures, Skin cancer of her left foot. Imaging suspicious for bronchogenic neoplasm with additional metastases and moderate R pleural effusion.  Clinical Impression  Patient alert and agreeable to participation upon arrival to room.  Noting improved pain control after recent medication administration (FACES 6/10).  LE strength grossly 3- to 4-/5 throughout, limited by pain, but moving much more fluidly than on initial evaluation.  Currently requiring max assist +1-2 for bed mobility; sup/min assist for unsupported sitting balance (R lateral lean); mod assist +2 for sit/stand, static standing balance and single step forward/backward with RW. Heavy WBing on bilat UEs due top ain; poor tolerance for modified SLS due to increased pain.  No overt buckling, but extensive assist for balance/safety.  Unable to tolerate additional stepping or OOB due to pain.  Will progress OOB to chair next session as tolerated. Would benefit from skilled PT to address above deficits and promote optimal return to PLOF.; recommend transition to STR upon discharge from acute hospitalization.     Follow Up Recommendations SNF    Equipment  Recommendations  Rolling walker with 5" wheels;3in1 (PT);Hospital bed;Wheelchair (measurements PT);Wheelchair cushion (measurements PT)    Recommendations for Other Services       Precautions / Restrictions Precautions Precautions: Fall Restrictions Weight Bearing Restrictions: Yes RLE Weight Bearing: Weight bearing as tolerated LLE Weight Bearing: Weight bearing as tolerated      Mobility  Bed Mobility Overal bed mobility: Needs Assistance Bed Mobility: Supine to Sit;Sit to Supine     Supine to sit: Max assist Sit to supine: Max assist;+2 for physical assistance   General bed mobility comments: limited ability to actively assist due to pain in bilat LEs/hips  Transfers Overall transfer level: Needs assistance Equipment used: Rolling walker (2 wheeled) Transfers: Sit to/from Stand Sit to Stand: Mod assist;+2 physical assistance         General transfer comment: able to complete full stand with +2 assist for lift off/balance, increased time/encouragement required  Ambulation/Gait Ambulation/Gait assistance: Mod assist;+2 physical assistance Gait Distance (Feet): 1 Feet Assistive device: Rolling walker (2 wheeled)       General Gait Details: single-step forward/backward; poor tolerance for modified SLS due to increased pain.  No overt buckling, but extensive assist for balance/safety.  Unable to tolerate additional stepping or OOB due to pain.  Stairs            Wheelchair Mobility    Modified Rankin (Stroke Patients Only)       Balance Overall balance assessment: Needs assistance Sitting-balance support: No upper extremity supported;Feet supported Sitting balance-Leahy Scale: Fair Sitting balance - Comments: R lateral lean attempting to unweight L pelvis Postural control: Right lateral lean Standing balance support: Bilateral upper extremity supported Standing balance-Leahy Scale: Poor  Pertinent  Vitals/Pain Pain Assessment: Faces Faces Pain Scale: Hurts even more Pain Location: pelvis with mobility efforts, L > R Pain Descriptors / Indicators: Grimacing;Guarding;Aching Pain Intervention(s): Limited activity within patient's tolerance;Monitored during session;Repositioned    Home Living Family/patient expects to be discharged to:: Private residence Living Arrangements: Alone Available Help at Discharge: Friend(s);Family;Available PRN/intermittently Type of Home: Apartment Home Access: Level entry     Home Layout: One level Home Equipment: Bedside commode;Grab bars - tub/shower;Walker - 2 wheels;Shower seat      Prior Function Level of Independence: Needs assistance         Comments: Ambulatory with RW for ADLs, household distances at baseline; does endorse multiple falls (at least 3 in previous 1-2 weeks) with progressive decline in recent 2-3 weeks due to worsening hip/LE pain.  Friend/family (Niece) manages medication, meals and community needs.     Hand Dominance        Extremity/Trunk Assessment   Upper Extremity Assessment Upper Extremity Assessment: Overall WFL for tasks assessed    Lower Extremity Assessment Lower Extremity Assessment: Generalized weakness(R LE grossly 4-/5 throughout, L LE grossly 3-/5 throughout (limited by pain))       Communication      Cognition Arousal/Alertness: Awake/alert Behavior During Therapy: WFL for tasks assessed/performed Overall Cognitive Status: History of cognitive impairments - at baseline                                 General Comments: Pt pleasant, follows commands with occasional cues, oriented to self and location; confused regarding details of situation and recent procedure.  Difficulty processing/recalling new information.      General Comments      Exercises Other Exercises Other Exercises: Sit/stand x2 with RW, mod assist +2 for lift off, balance, overall asfety. Consistent cuing for  postural extension Other Exercises: Max assist +2 for return to and repositioning in bed   Assessment/Plan    PT Assessment Patient needs continued PT services  PT Problem List Decreased strength;Decreased range of motion;Decreased activity tolerance;Decreased balance;Decreased mobility;Decreased coordination;Decreased cognition;Decreased knowledge of use of DME;Decreased safety awareness;Decreased knowledge of precautions;Cardiopulmonary status limiting activity;Pain       PT Treatment Interventions DME instruction;Gait training;Functional mobility training;Therapeutic activities;Therapeutic exercise;Balance training;Cognitive remediation;Patient/family education    PT Goals (Current goals can be found in the Care Plan section)  Acute Rehab PT Goals Patient Stated Goal: have less pain PT Goal Formulation: With patient/family Time For Goal Achievement: 10/01/19 Potential to Achieve Goals: Fair    Frequency Min 2X/week   Barriers to discharge Decreased caregiver support      Co-evaluation               AM-PAC PT "6 Clicks" Mobility  Outcome Measure Help needed turning from your back to your side while in a flat bed without using bedrails?: Total Help needed moving from lying on your back to sitting on the side of a flat bed without using bedrails?: Total Help needed moving to and from a bed to a chair (including a wheelchair)?: Total Help needed standing up from a chair using your arms (e.g., wheelchair or bedside chair)?: A Lot Help needed to walk in hospital room?: Total Help needed climbing 3-5 steps with a railing? : Total 6 Click Score: 7    End of Session Equipment Utilized During Treatment: Gait belt Activity Tolerance: Patient tolerated treatment well;Patient limited by pain Patient left: in bed;with call bell/phone within  reach;with bed alarm set;with family/visitor present Nurse Communication: Mobility status PT Visit Diagnosis: Muscle weakness (generalized)  (M62.81);Repeated falls (R29.6);Difficulty in walking, not elsewhere classified (R26.2);Pain Pain - Right/Left: Left Pain - part of body: Hip    Time: 1610-9604 PT Time Calculation (min) (ACUTE ONLY): 34 min   Charges:   PT Evaluation $PT Re-evaluation: 1 Re-eval PT Treatments $Therapeutic Activity: 8-22 mins       Mykenna Viele H. Manson Passey, PT, DPT, NCS 09/17/19, 1:49 PM [phone]

## 2019-09-17 NOTE — Progress Notes (Signed)
Daily Progress Note   Patient Name: Brittany Hughes       Date: 09/17/2019 DOB: [birth year]  Age: 85 y.o. MRN#: [id number] Attending Physician: Caren Griffins, MD Primary Care Physician: Brittany Koch, NP Admit Date: 09/12/2019  Reason for Consultation/Follow-up: Establishing goals of care  Subjective: Patient is resting in bed. She states her pain is controlled at this time. She states she has always been a very independent woman and also likes her privacy. She states she would prefer to be in her home, but does not want people to have to stay with her. She states she will not go to live with someone else, as she states she has heard of the arguments and frustration of moving in to stay with someone. Brittany Hughes would like SNF at this time, and would be amenable to long term care if she is not at a place of going home as independently as she was prior to her fractures. She is aware that as things change in the future, she will have to have assistance and would want to move into a nursing home at end of life if she is able to return home following SNF stay. She is aware of visitor restrictions and is okay with this.   Spoke with Brittany Hughes to update as per Brittany Hughes's request. She states she agrees with this plan. She requests [facility]. Brittany Hughes states she has read Brittany Hughes's HPOA papers and the patient's sister is secondary HPOA if Brittany Hughes is not able to be.    Length of Stay: 5  Current Medications: Scheduled Meds:  . amitriptyline  75 mg Oral QHS  . docusate sodium  100 mg Oral Daily  . gabapentin  300 mg Oral QHS  . ipratropium-albuterol  3 mL Nebulization BID  . levothyroxine  25 mcg Oral Q0600  . Melatonin  5 mg Oral Daily  . metoprolol succinate  50 mg Oral Daily  . pantoprazole  40 mg  Oral Daily  . polyethylene glycol  17 g Oral Daily  . vitamin B-12  1,000 mcg Oral Daily    Continuous Infusions: . azithromycin 500 mg (09/15/19 1740)  .  ceFAZolin (ANCEF) IV    . cefTRIAXone (ROCEPHIN)  IV 1 g (09/16/19 1249)    PRN Meds: acetaminophen **OR** acetaminophen, albuterol, bisacodyl,  hydrALAZINE, HYDROcodone-acetaminophen, morphine injection, ondansetron **OR** ondansetron (ZOFRAN) IV, promethazine, senna-docusate, traZODone  Physical Exam Pulmonary:     Effort: Pulmonary effort is normal.  Skin:    General: Skin is warm and dry.  Neurological:     Mental Status: She is alert.     Comments: Answers questions and talks appropriately.              Vital Signs: BP (!) 145/93 (BP Location: Right Arm)   Pulse 90   Temp 97.7 F (36.5 C) (Oral)   Resp 20   Ht 5\' 2"  (1.575 m)   Wt 68 kg   SpO2 95%   BMI 27.44 kg/m  SpO2: SpO2: 95 % O2 Device: O2 Device: Nasal Cannula O2 Flow Rate: O2 Flow Rate (L/min): 2 L/min  Intake/output summary:   Intake/Output Summary (Last 24 hours) at 09/17/2019 1025 Last data filed at 09/17/2019 0956 Gross per 24 hour  Intake 969.48 ml  Output 152 ml  Net 817.48 ml   LBM: Last BM Date: 09/14/19 Baseline Weight: Weight: 68 kg Most recent weight: Weight: 68 kg       Palliative Assessment/Data: 50%      Patient Active Problem List   Diagnosis Date Noted  . Multiple closed fractures of pelvis ([place])   . Bone lesion   . Lung mass   . Left hip pain   . Pneumonia 09/12/2019  . Hemorrhoids 02/15/2018  . Allergic rhinitis 02/15/2018  . Urinary incontinence 10/03/2017  . Abnormality of gait 06/20/2016  . Chronic constipation 04/18/2016  . Hyperglycemia 04/18/2016  . Anemia 04/18/2016  . Hypothyroidism 04/18/2016  . IBS (irritable bowel syndrome) 09/29/2014  . Essential hypertension 09/29/2014  . Neuropathy 09/29/2014  . Insomnia 09/29/2014  . Leg edema, left 04/07/2014  . Dementia, vascular, with depression ([place])  03/11/2014  . GERD (gastroesophageal reflux disease) 01/06/2014  . Frequent falls 01/06/2014  . Hyperlipidemia with target LDL less than 100 11/12/2013  . Arthritis of left knee 11/12/2013  . Atrophy of vulva 02/18/2013  . Malunion and nonunion of fracture(733.8) 02/18/2013  . Vitamin D deficiency 02/18/2013  . Chronic kidney disease, stage III (moderate) 02/18/2013  . Other B-complex deficiencies 02/18/2013  . Tobacco use disorder 02/18/2013  . Other diseases of vocal cords 02/18/2013  . Anxiety and depression 02/18/2013  . Chronic pain syndrome 02/18/2013  . Deafness or hearing loss of type classifiable to 389.0 with type classifiable to 389.1 02/18/2013  . Occlusion and stenosis of carotid artery without mention of cerebral infarction 02/18/2013    Palliative Care Assessment & Plan   Recommendations/Plan:  DNR/DNI. Would be amenable to SNF admission with palliative and transition to hospice depending on outcome of SNF. They would like [facility].      Code Status:    Code Status Orders  (From admission, onward)         Start     Ordered   09/15/19 1438  Do not attempt resuscitation (DNR)  Continuous    Question Answer Comment  In the event of cardiac or respiratory ARREST Do not call a "code blue"   In the event of cardiac or respiratory ARREST Do not perform Intubation, CPR, defibrillation or ACLS   In the event of cardiac or respiratory ARREST Use medication by any route, position, wound care, and other measures to relive pain and suffering. May use oxygen, suction and manual treatment of airway obstruction as needed for comfort.      09/15/19 1437  Code Status History    Date Active Date Inactive Code Status Order ID Comments User Context   09/12/2019 1412 09/15/2019 1437 Full Code [id number]  Karie Kirks, DO ED   01/06/2014 1844 01/09/2014 1433 Full Code [id number]  Delfina Redwood, MD Inpatient   Advance Care Planning Activity    Advance Directive  Documentation     Most Recent Value  Type of Advance Directive  Healthcare Power of Attorney, Living will  Pre-existing out of facility DNR order (yellow form or pink MOST form)  -  "MOST" Form in Place?  -       Prognosis:   Unable to determine  Discharge Planning:  To Be Determined   Thank you for allowing the Palliative Medicine Team to assist in the care of this patient.   Time In: 10:20 Time Out: 11:30 Total Time 70 min Prolonged Time Billed yes      Greater than 50%  of this time was spent counseling and coordinating care related to the above assessment and plan.  Asencion Gowda, NP  Please contact Palliative Medicine Team phone at [phone] for questions and concerns.

## 2019-09-18 LAB — BASIC METABOLIC PANEL
Anion gap: 11 (ref 5–15)
BUN: 16 mg/dL (ref 8–23)
CO2: 24 mmol/L (ref 22–32)
Calcium: 9.2 mg/dL (ref 8.9–10.3)
Chloride: 103 mmol/L (ref 98–111)
Creatinine, Ser: 0.84 mg/dL (ref 0.44–1.00)
GFR calc Af Amer: >60 mL/min (ref >60)
GFR calc non Af Amer: >60 mL/min (ref >60)
Glucose, Bld: 100 mg/dL — ABNORMAL HIGH (ref 70–99)
Potassium: 3.6 mmol/L (ref 3.5–5.1)
Sodium: 138 mmol/L (ref 135–145)

## 2019-09-18 LAB — CBC
HCT: 30 % — ABNORMAL LOW (ref 36.0–46.0)
Hemoglobin: 9.2 g/dL — ABNORMAL LOW (ref 12.0–15.0)
MCH: 24.9 pg — ABNORMAL LOW (ref 26.0–34.0)
MCHC: 30.7 g/dL (ref 30.0–36.0)
MCV: 81.3 fL (ref 80.0–100.0)
Platelets: 194 10*3/uL (ref 150–400)
RBC: 3.69 MIL/uL — ABNORMAL LOW (ref 3.87–5.11)
RDW: 16 % — ABNORMAL HIGH (ref 11.5–15.5)
WBC: 8 10*3/uL (ref 4.0–10.5)
nRBC: 0 % (ref 0.0–0.2)

## 2019-09-18 LAB — SURGICAL PATHOLOGY

## 2019-09-18 LAB — CYTOLOGY - NON PAP

## 2019-09-18 LAB — SARS CORONAVIRUS 2 (TAT 6-24 HRS): SARS Coronavirus 2: NEGATIVE

## 2019-09-18 NOTE — Progress Notes (Addendum)
New referral for [facility] community Palliative program to follow at [facility] in [place] received from [place] [facility] aware. Plan is for discharge tomorrow pending repeat COVID test results. Patient information given to referral. Flo Shanks BSN, RN, [place] [phone]

## 2019-09-18 NOTE — TOC Progression Note (Signed)
Transition of Care [facility]) - Progression Note    Patient Details  Name: HALAYNA BLANE MRN: [id number] Date of Birth: 1928-02-23  Transition of Care [facility]) CM/SW Contact  Opie Fanton, Lenice Llamas Phone Number: [phone] 09/18/2019, 3:26 PM  Clinical Narrative: Clinical Social Worker (CSW) met with patient and presented SNF bed offers. Patient chose [facility] in [place]. Angela admissions coordinator at [facility] in [place] is aware of accepted bed offer and reported that patient can come tomorrow pending another negative covid test. MD and RN aware of above. CSW contacted patient's [name] and made her aware of above. Karna Christmas is in agreement with plan. Patient will have outpatient palliative care following. Sandi Raveling hospice and palliative care liaison is aware of above. CSW will continue to follow and assist as needed.       Expected Discharge Plan: [place] Barriers to Discharge: Continued Medical Work up  Expected Discharge Plan and Services Expected Discharge Plan: [place]   Discharge Planning Services: CM Consult Post Acute Care Choice: [place] arrangements for the past 2 months: Single Family Home                           HH Arranged: RN, PT, Nurse's Aide [place] Agency: [place] ([facility]) Date [place]: 09/13/19 Time [place]: 1133 Representative spoke with at [place]: [place] ([place]) Interventions    Readmission Risk Interventions Readmission Risk Prevention Plan 09/13/2019  Post Dischage Appt Complete  Medication Screening Complete  Transportation Screening Complete  Some recent data might be hidden

## 2019-09-18 NOTE — Progress Notes (Signed)
PROGRESS NOTE  Brittany Hughes QQV:[id number] DOB: 10-Oct-1928 DOA: 09/12/2019 PCP: Pleas Koch, NP   LOS: 6 days   Brief Narrative / Interim history: 85 year old woman with hypertension, hypothyroidism, admitted to the hospital with progressive weakness, dysuria who also had a fall the week prior to admission.  She does have medical history of prior DVT, osteoporosis.  She lives alone with help from her nieces.  In the ED she was found to have multiple pelvic fractures, some of these appear to be related to osteopenia and others possibly suggesting multiple myeloma/metastatic disease.  She was also found to have a right lower lobe pneumonia as well as a urinary tract infection.  Imaging revealed a left pelvic mass in the right lower lobe mass.  Pulmonology was consulted for biopsy via bronchoscopy however the patient's niece (POA) decided not to pursue invasive procedures and prefers the patient to be mobile/comfortable.  Orthopedic surgery was consulted and patient underwent a sacroplasty on 09/16/2019.  Subjective / 24h Interval events: Pain is a little bit better this morning, however she still complains of significant discomfort when she moves around.  Denies any chest pain, no shortness of breath.  Assessment & Plan: Active Problems:   Pneumonia   Multiple closed fractures of pelvis (HCC)   Bone lesion   Lung mass   Left hip pain   Principal Problem Right lower lobe pneumonia with acute hypoxic respiratory failure -Patient was started on ceftriaxone and azithromycin on 09/13/2019, continue for total of 7 days with 1 day remaining, respiratory status seems to be stable, she is afebrile and her leukocytosis is improving -Supportive care, encourage incentive spirometry and sitting in the bedside chair as much as possible during the daytime -Requiring 2 L nasal cannula, attempt to wean off to room air  Active Problems Urinary tract infection -Symptoms improved, urine cultures with  10 to the fifth colonies of Aerococcus urinary, current antibiotic regimen should cover -Improving, afebrile, leukocytosis is getting better  Multiple pelvic fractures /sacral fracture -Status post sacral plasty by orthopedic surgery on 09/16/2019.   -Pain will be better, PT recommended SNF, discharge planning is pending, repeat Covid testing  Concern for metastatic disease -Per prior discussions by Dr. Benny Lennert and POA as well as pulmonology would not pursue aggressive work-up, she is a very poor candidate for systemic therapy  Hypothyroidism -continue Synthroid  Hypertension -Continue home metoprolol  Mild dementia -Stable   Scheduled Meds: . amitriptyline  75 mg Oral QHS  . docusate sodium  100 mg Oral Daily  . enoxaparin (LOVENOX) injection  40 mg Subcutaneous Q24H  . gabapentin  300 mg Oral QHS  . ipratropium-albuterol  3 mL Nebulization BID  . levothyroxine  25 mcg Oral Q0600  . Melatonin  5 mg Oral Daily  . metoprolol succinate  50 mg Oral Daily  . pantoprazole  40 mg Oral Daily  . polyethylene glycol  17 g Oral Daily  . vitamin B-12  1,000 mcg Oral Daily   Continuous Infusions: .  ceFAZolin (ANCEF) IV    . cefTRIAXone (ROCEPHIN)  IV Stopped (09/17/19 1354)   PRN Meds:.acetaminophen **OR** acetaminophen, albuterol, bisacodyl, hydrALAZINE, HYDROcodone-acetaminophen, morphine injection, ondansetron **OR** ondansetron (ZOFRAN) IV, oxyCODONE, promethazine, senna-docusate, traZODone  DVT prophylaxis: SCDs Code Status: DNR Family Communication: Discussed with patient's niece over the phone Disposition Plan: likely needs SNF  Consultants:  Orthopedic surgery, Dr. Rudene Christians  Procedures:  Sacroplasty 11/10  Microbiology  Blood cultures 09/12/2019-no growth, pending Urine cultures-10 to the fifth Aerococcus SARS-CoV-2  11/6-negative  Antimicrobials: Ceftriaxone/azithromycin 11/7 >>   Objective: Vitals:   09/17/19 2019 09/17/19 2041 09/18/19 0451 09/18/19 0936  BP:   (!) 152/72 139/66 139/64  Pulse:  81 81 90  Resp:  _0 Temp:  98.2 F (36.8 C) 99.5 F (37.5 C) 99.3 F (37.4 C)  TempSrc:  Oral Oral Axillary  SpO2: 94% (!) 87% 93% 94%  Weight:      Height:        Intake/Output Summary (Last 24 hours) at 09/18/2019 1158 Last data filed at 09/18/2019 0955 Gross per 24 hour  Intake 460 ml  Output -  Net 460 ml   Filed Weights   09/12/19 1140 09/16/19 1323  Weight: 68 kg 68 kg    Examination:  Constitutional: No distress Eyes: No scleral icterus ENMT: Moist mucous membranes Neck: normal, supple Respiratory: Clear bilaterally without wheezing or crackles, normal respiratory effort Cardiovascular: Regular rate and rhythm, no murmurs Abdomen: Soft, nontender, nondistended, bowel sounds positive Musculoskeletal: no clubbing / cyanosis.  Skin: No rashes seen Neurologic: No focal deficits Psychiatric: Normal judgment and insight. Alert and oriented x 3. Normal mood.    Data Reviewed: I have independently reviewed following labs and imaging studies   CBC: Recent Labs  Lab 09/12/19 1202 09/13/19 0527 09/14/19 0635 09/14/19 1653 09/18/19 0515  WBC 18.0* 12.7* 12.4* 11.8* 8.0  NEUTROABS  --   --  10.3*  --   --   HGB 10.3* 9.4* 9.8* 9.3* 9.2*  HCT 32.9* 30.3* 31.1* 29.6* 30.0*  MCV 81.4 82.8 80.2 80.0 81.3  PLT 200 171 198 197 629   Basic Metabolic Panel: Recent Labs  Lab 09/12/19 1202 09/13/19 0527 09/14/19 0635 09/15/19 0433 09/18/19 0515  NA 137 140 138 139 138  K 4.3 4.1 3.9 3.7 3.6  CL 102 106 104 105 103  CO2 _1 GLUCOSE 90 103* 112* 100* 100*  BUN 28* 27* _2 CREATININE 1.40* 1.05* 0.92 0.98 0.84  CALCIUM 9.8 9.1 9.1 9.0 9.2   GFR: Estimated Creatinine Clearance: 41.1 mL/min (by C-G formula based on SCr of 0.84 mg/dL). Liver Function Tests: Recent Labs  Lab 09/12/19 1202 09/13/19 0527 09/14/19 0635  AST 19 14* 15  ALT _3 ALKPHOS 137* 117 126  BILITOT 0.7 0.5 0.7  PROT 7.0  5.9* 6.5  ALBUMIN 3.3* 2.8* 2.8*   Coagulation Profile: No results for input(s): INR, PROTIME in the last 168 hours. HbA1C: No results for input(s): HGBA1C in the last 72 hours. CBG: No results for input(s): GLUCAP in the last 168 hours.  Recent Results (from the past 240 hour(s))  Urine culture     Status: Abnormal   Collection Time: 09/12/19  1:13 PM   Specimen: Urine, Catheterized  Result Value Ref Range Status   Specimen Description   Final    URINE, CATHETERIZED Performed at [facility], [address] [place], [place] [address]    Special Requests   Final    NONE Performed at [facility], [address] [place], [place] [address]    Culture (A)  Final    >=100,000 COLONIES/mL AEROCOCCUS URINAE Standardized susceptibility testing for this organism is not available. Performed at [facility], [place] [address], [place], [place] [address]    Report Status 09/14/2019 FINAL  Final  Blood culture (routine x 2)     Status: None   Collection Time: 09/12/19  1:21 PM  Specimen: BLOOD  Result Value Ref Range Status   Specimen Description BLOOD Blood Culture adequate volume  Final   Special Requests   Final    BOTTLES DRAWN AEROBIC AND ANAEROBIC LEFT ANTECUBITAL   Culture   Final    NO GROWTH 5 DAYS Performed at [facility], [address] [place], [place] [address]    Report Status 09/17/2019 FINAL  Final  SARS CORONAVIRUS 2 (TAT 6-24 HRS) Nasopharyngeal Nasopharyngeal Swab     Status: None   Collection Time: 09/12/19  2:21 PM   Specimen: Nasopharyngeal Swab  Result Value Ref Range Status   SARS Coronavirus 2 NEGATIVE NEGATIVE Final    Comment: (NOTE) SARS-CoV-2 target nucleic acids are NOT DETECTED. The SARS-CoV-2 RNA is generally detectable in upper and lower respiratory specimens during the acute phase of infection. Negative results do not preclude SARS-CoV-2 infection, do not rule out co-infections with other pathogens, and  should not be used as the sole basis for treatment or other patient management decisions. Negative results must be combined with clinical observations, patient history, and epidemiological information. The expected result is Negative. Fact Sheet for Patients: SugarRoll.be Fact Sheet for Healthcare Providers: https://www.woods-mathews.com/ This test is not yet approved or cleared by the [place] FDA and  has been authorized for detection and/or diagnosis of SARS-CoV-2 by FDA under an Emergency Use Authorization (EUA). This EUA will remain  in effect (meaning this test can be used) for the duration of the COVID-19 declaration under Section 56 4(b)(1) of the Act, 21 U.S.C. section 360bbb-3(b)(1), unless the authorization is terminated or revoked sooner. Performed at [facility], [place] [address], [place], [place] [address]   Blood culture (routine x 2)     Status: None   Collection Time: 09/12/19  2:48 PM   Specimen: BLOOD  Result Value Ref Range Status   Specimen Description BLOOD RIGHT ANTECUBITAL  Final   Special Requests   Final    BOTTLES DRAWN AEROBIC AND ANAEROBIC Blood Culture adequate volume   Culture   Final    NO GROWTH 5 DAYS Performed at [facility], [address], [place],  [address]    Report Status 09/17/2019 FINAL  Final     Radiology Studies: Dg Lumbar Spine 2-3 Views  Result Date: 09/16/2019 CLINICAL DATA:  Sacroplasty. EXAM: LUMBAR SPINE - 2-3 VIEW; DG C-ARM 1-60 MIN Radiation exposure index: 10.10 mGy. COMPARISON:  None. FINDINGS: Single intraoperative fluoroscopic image was obtained of the sacrum. This demonstrates the patient be status post bilateral sacroplasty. IMPRESSION: Fluoroscopic guidance provided during bilateral sacroplasty. Electronically Signed   By: Marijo Conception M.D.   On: 09/16/2019 15:30   Dg C-arm 1-60 Min  Result Date: 09/16/2019 CLINICAL DATA:  Sacroplasty. EXAM:  LUMBAR SPINE - 2-3 VIEW; DG C-ARM 1-60 MIN Radiation exposure index: 10.10 mGy. COMPARISON:  None. FINDINGS: Single intraoperative fluoroscopic image was obtained of the sacrum. This demonstrates the patient be status post bilateral sacroplasty. IMPRESSION: Fluoroscopic guidance provided during bilateral sacroplasty. Electronically Signed   By: Marijo Conception M.D.   On: 09/16/2019 15:30   Marzetta Board, MD, PhD [facility]  Contact via  [email or web address]

## 2019-09-18 NOTE — Progress Notes (Signed)
Physical Therapy Treatment Patient Details Name: Brittany Hughes MRN: [id number] DOB: 1928/06/27 Today's Date: 09/18/2019    History of Present Illness 85yo female admitted for inability to walk with progressively worsening pain in her groin with walking in the last week; admitted for management of multiple pelvic fractures (bilat sacral ala, S1 and S4, R parasymphaseal, L inferior pubic rami) attributed to myeloma/metastatic disease. Now status post sacroplasty (09/16/19) with orders to resume PT services as appropriate.  Additional masses noted on imaging-infrahilar, perihilar and L adrenal mass; oncology consulted and involved.  Past medical history significant for DVT of the right lower extremity, GERD, Hyperlipidemia, Hypertension, Mild cognitive impairment, osteoporosis, seizures, Skin cancer of her left foot. Imaging suspicious for bronchogenic neoplasm with additional metastases and moderate R pleural effusion.    PT Comments    Slight increase in confusion noted this date (medication related?).  Patient with absent recall of therapist or activities from previous date.  Improved tolerance and participation with open chain, supine therex; continues with poor tolerance for upright positioning (sitting or standing) due to pain in bilat hips/pelvis.    Follow Up Recommendations  SNF     Equipment Recommendations  Rolling walker with 5" wheels;3in1 (PT);Hospital bed;Wheelchair (measurements PT);Wheelchair cushion (measurements PT)    Recommendations for Other Services       Precautions / Restrictions Precautions Precautions: Fall Restrictions Weight Bearing Restrictions: Yes RLE Weight Bearing: Weight bearing as tolerated LLE Weight Bearing: Weight bearing as tolerated    Mobility  Bed Mobility Overal bed mobility: Needs Assistance Bed Mobility: Supine to Sit;Sit to Supine     Supine to sit: Total assist;+2 for physical assistance Sit to supine: Total assist;+2 for physical  assistance   General bed mobility comments: attempts assist with bilat LE movement, but very guarded and painful with truncal elevation and bilat hip flexion when moving towards edge of bed  Transfers Overall transfer level: Needs assistance Equipment used: Rolling walker (2 wheeled) Transfers: Sit to/from Stand Sit to Stand: Mod assist;+2 physical assistance         General transfer comment: heavy use of bilat UEs; greater difficulty, pain this date compared to previous session; attempted x2, unable to maintain >3-4 seconds  Ambulation/Gait             General Gait Details: unsafe/unable   Stairs             Wheelchair Mobility    Modified Rankin (Stroke Patients Only)       Balance Overall balance assessment: Needs assistance Sitting-balance support: No upper extremity supported;Feet supported Sitting balance-Leahy Scale: Poor Sitting balance - Comments: requires bilat UE Support to offload pelvis   Standing balance support: Bilateral upper extremity supported Standing balance-Leahy Scale: Poor                              Cognition Arousal/Alertness: Awake/alert Behavior During Therapy: WFL for tasks assessed/performed Overall Cognitive Status: History of cognitive impairments - at baseline                                 General Comments: absent recall of therapist or activities from previous date      Exercises Other Exercises Other Exercises: Supine LE therex, 1x10, act assist ROM: ankle pumps, SAQs, heel slides, hip abduct/adduct.  Improved tolerance for open chain movement    General Comments  Pertinent Vitals/Pain Pain Assessment: Faces Faces Pain Scale: Hurts whole lot Pain Location: pelvis with mobility efforts, L > R Pain Descriptors / Indicators: Grimacing;Guarding;Aching Pain Intervention(s): Limited activity within patient's tolerance;Repositioned    Home Living                       Prior Function            PT Goals (current goals can now be found in the care plan section) Acute Rehab PT Goals Patient Stated Goal: have less pain PT Goal Formulation: With patient/family Time For Goal Achievement: 10/01/19 Potential to Achieve Goals: Fair Progress towards PT goals: Progressing toward goals    Frequency    Min 2X/week      PT Plan Current plan remains appropriate    Co-evaluation              AM-PAC PT "6 Clicks" Mobility   Outcome Measure  Help needed turning from your back to your side while in a flat bed without using bedrails?: Total   Help needed moving to and from a bed to a chair (including a wheelchair)?: Total Help needed standing up from a chair using your arms (e.g., wheelchair or bedside chair)?: A Lot Help needed to walk in hospital room?: Total Help needed climbing 3-5 steps with a railing? : Total 6 Click Score: 6    End of Session Equipment Utilized During Treatment: Gait belt Activity Tolerance: Patient tolerated treatment well;Patient limited by pain Patient left: in bed;with call bell/phone within reach;with bed alarm set;with family/visitor present Nurse Communication: Mobility status PT Visit Diagnosis: Muscle weakness (generalized) (M62.81);Repeated falls (R29.6);Difficulty in walking, not elsewhere classified (R26.2);Pain Pain - Right/Left: Left Pain - part of body: Hip     Time: 8295-6213 PT Time Calculation (min) (ACUTE ONLY): 26 min  Charges:  $Therapeutic Exercise: 8-22 mins $Therapeutic Activity: 8-22 mins                     Brittany Hughes H. Manson Passey, PT, DPT, NCS 09/18/19, 4:27 PM [phone]

## 2019-09-18 NOTE — Care Management Important Message (Signed)
Important Message  Patient Details  Name: Brittany Hughes MRN: [id number] Date of Birth: 1928/10/30   Medicare Important Message Given:  Yes     Juliann Pulse A Inara Dike 09/18/2019, 1:45 PM

## 2019-09-18 NOTE — Progress Notes (Addendum)
Daily Progress Note   Patient Name: Brittany Hughes       Date: 09/18/2019 DOB: 01-28-1928  Age: 85 y.o. MRN#: [id number] Attending Physician: Caren Griffins, MD Primary Care Physician: Pleas Koch, NP Admit Date: 09/12/2019  Reason for Consultation/Follow-up: Establishing goals of care  Subjective: Patient is resting in bed. We discussed her goals moving forward. She confirms she wants to go to SNF, and live in the facility if needed. She does state she would not want to return to the hospital if she declined following this admission, and would be amenable to transitioning to hospice. She requested I speak with Terri. I encouraged her to review her wishes with her as well. Called Terri, VM left.   Length of Stay: 6  Current Medications: Scheduled Meds:  . amitriptyline  75 mg Oral QHS  . docusate sodium  100 mg Oral Daily  . enoxaparin (LOVENOX) injection  40 mg Subcutaneous Q24H  . gabapentin  300 mg Oral QHS  . ipratropium-albuterol  3 mL Nebulization BID  . levothyroxine  25 mcg Oral Q0600  . Melatonin  5 mg Oral Daily  . metoprolol succinate  50 mg Oral Daily  . pantoprazole  40 mg Oral Daily  . polyethylene glycol  17 g Oral Daily  . vitamin B-12  1,000 mcg Oral Daily    Continuous Infusions: .  ceFAZolin (ANCEF) IV    . cefTRIAXone (ROCEPHIN)  IV Stopped (09/17/19 1354)    PRN Meds: acetaminophen **OR** acetaminophen, albuterol, bisacodyl, hydrALAZINE, HYDROcodone-acetaminophen, morphine injection, ondansetron **OR** ondansetron (ZOFRAN) IV, oxyCODONE, promethazine, senna-docusate, traZODone  Physical Exam Pulmonary:     Effort: Pulmonary effort is normal.  Skin:    General: Skin is warm and dry.  Neurological:     Mental Status: She is alert.              Vital Signs: BP 139/64 (BP Location: Right Arm)   Pulse 90   Temp 99.3 F (37.4 C) (Axillary)   Resp 17   Ht 5\' 2"  (1.575 m)   Wt 68 kg   SpO2 94%   BMI 27.44 kg/m  SpO2: SpO2: 94 % O2 Device: O2 Device: Nasal Cannula O2 Flow Rate: O2 Flow Rate (L/min): 2 L/min  Intake/output summary:   Intake/Output Summary (Last 24 hours) at 09/18/2019  1209 Last data filed at 09/18/2019 0955 Gross per 24 hour  Intake 460 ml  Output -  Net 460 ml   LBM: Last BM Date: (Pt states it's been a while.) Baseline Weight: Weight: 68 kg Most recent weight: Weight: 68 kg       Palliative Assessment/Data: 50%      Patient Active Problem List   Diagnosis Date Noted  . Multiple closed fractures of pelvis ([place])   . Bone lesion   . Lung mass   . Left hip pain   . Pneumonia 09/12/2019  . Hemorrhoids 02/15/2018  . Allergic rhinitis 02/15/2018  . Urinary incontinence 10/03/2017  . Abnormality of gait 06/20/2016  . Chronic constipation 04/18/2016  . Hyperglycemia 04/18/2016  . Anemia 04/18/2016  . Hypothyroidism 04/18/2016  . IBS (irritable bowel syndrome) 09/29/2014  . Essential hypertension 09/29/2014  . Neuropathy 09/29/2014  . Insomnia 09/29/2014  . Leg edema, left 04/07/2014  . Dementia, vascular, with depression ([place]) 03/11/2014  . GERD (gastroesophageal reflux disease) 01/06/2014  . Frequent falls 01/06/2014  . Hyperlipidemia with target LDL less than 100 11/12/2013  . Arthritis of left knee 11/12/2013  . Atrophy of vulva 02/18/2013  . Malunion and nonunion of fracture(733.8) 02/18/2013  . Vitamin D deficiency 02/18/2013  . Chronic kidney disease, stage III (moderate) 02/18/2013  . Other B-complex deficiencies 02/18/2013  . Tobacco use disorder 02/18/2013  . Other diseases of vocal cords 02/18/2013  . Anxiety and depression 02/18/2013  . Chronic pain syndrome 02/18/2013  . Deafness or hearing loss of type classifiable to 389.0 with type classifiable to 389.1 02/18/2013  .  Occlusion and stenosis of carotid artery without mention of cerebral infarction 02/18/2013    Palliative Care Assessment & Plan   Recommendations/Plan:  DNR/DNI. Would be amenable to SNF admission with palliative and transition to hospice depending on outcome of SNF. They would like [facility].      Code Status:    Code Status Orders  (From admission, onward)         Start     Ordered   09/15/19 1438  Do not attempt resuscitation (DNR)  Continuous    Question Answer Comment  In the event of cardiac or respiratory ARREST Do not call a "code blue"   In the event of cardiac or respiratory ARREST Do not perform Intubation, CPR, defibrillation or ACLS   In the event of cardiac or respiratory ARREST Use medication by any route, position, wound care, and other measures to relive pain and suffering. May use oxygen, suction and manual treatment of airway obstruction as needed for comfort.      09/15/19 1437        Code Status History    Date Active Date Inactive Code Status Order ID Comments User Context   09/12/2019 1412 09/15/2019 1437 Full Code [id number]  Karie Kirks, DO ED   01/06/2014 1844 01/09/2014 1433 Full Code [id number]  Delfina Redwood, MD Inpatient   Advance Care Planning Activity    Advance Directive Documentation     Most Recent Value  Type of Advance Directive  Healthcare Power of Attorney, Living will  Pre-existing out of facility DNR order (yellow form or pink MOST form)  -  "MOST" Form in Place?  -       Prognosis:  < 6 months. Findings consistent with cancer and not a candidate for chemotherapy or surgery, nor does she want interventions. Postobstructive process and lower lobe opacity associated with infrahilar mass.  Discharge Planning:  To Be Determined   Thank you for allowing the Palliative Medicine Team to assist in the care of this patient.   Time In: 10:20 Time Out: 11:30 Total Time 70 min Prolonged Time Billed yes      Greater than 50%  of this  time was spent counseling and coordinating care related to the above assessment and plan.  Asencion Gowda, NP  Please contact Palliative Medicine Team phone at [phone] for questions and concerns.

## 2019-09-19 DIAGNOSIS — E46 Unspecified protein-calorie malnutrition: Secondary | ICD-10-CM | POA: Diagnosis not present

## 2019-09-19 DIAGNOSIS — Q5039 Other congenital malformation of ovary: Secondary | ICD-10-CM | POA: Diagnosis not present

## 2019-09-19 DIAGNOSIS — R404 Transient alteration of awareness: Secondary | ICD-10-CM | POA: Diagnosis not present

## 2019-09-19 DIAGNOSIS — F329 Major depressive disorder, single episode, unspecified: Secondary | ICD-10-CM | POA: Diagnosis not present

## 2019-09-19 DIAGNOSIS — K5900 Constipation, unspecified: Secondary | ICD-10-CM | POA: Diagnosis not present

## 2019-09-19 DIAGNOSIS — M810 Age-related osteoporosis without current pathological fracture: Secondary | ICD-10-CM | POA: Diagnosis not present

## 2019-09-19 DIAGNOSIS — K219 Gastro-esophageal reflux disease without esophagitis: Secondary | ICD-10-CM | POA: Diagnosis not present

## 2019-09-19 DIAGNOSIS — R319 Hematuria, unspecified: Secondary | ICD-10-CM | POA: Diagnosis not present

## 2019-09-19 DIAGNOSIS — S3282XA Multiple fractures of pelvis without disruption of pelvic ring, initial encounter for closed fracture: Secondary | ICD-10-CM | POA: Diagnosis not present

## 2019-09-19 DIAGNOSIS — R918 Other nonspecific abnormal finding of lung field: Secondary | ICD-10-CM | POA: Diagnosis not present

## 2019-09-19 DIAGNOSIS — S3289XA Fracture of other parts of pelvis, initial encounter for closed fracture: Secondary | ICD-10-CM | POA: Diagnosis not present

## 2019-09-19 DIAGNOSIS — R1312 Dysphagia, oropharyngeal phase: Secondary | ICD-10-CM | POA: Diagnosis not present

## 2019-09-19 DIAGNOSIS — M2550 Pain in unspecified joint: Secondary | ICD-10-CM | POA: Diagnosis not present

## 2019-09-19 DIAGNOSIS — D649 Anemia, unspecified: Secondary | ICD-10-CM | POA: Diagnosis not present

## 2019-09-19 DIAGNOSIS — R509 Fever, unspecified: Secondary | ICD-10-CM | POA: Diagnosis not present

## 2019-09-19 DIAGNOSIS — R52 Pain, unspecified: Secondary | ICD-10-CM | POA: Diagnosis not present

## 2019-09-19 DIAGNOSIS — Z7401 Bed confinement status: Secondary | ICD-10-CM | POA: Diagnosis not present

## 2019-09-19 DIAGNOSIS — I10 Essential (primary) hypertension: Secondary | ICD-10-CM | POA: Diagnosis not present

## 2019-09-19 DIAGNOSIS — R41841 Cognitive communication deficit: Secondary | ICD-10-CM | POA: Diagnosis not present

## 2019-09-19 DIAGNOSIS — E43 Unspecified severe protein-calorie malnutrition: Secondary | ICD-10-CM | POA: Diagnosis not present

## 2019-09-19 DIAGNOSIS — K6289 Other specified diseases of anus and rectum: Secondary | ICD-10-CM | POA: Diagnosis not present

## 2019-09-19 DIAGNOSIS — M899 Disorder of bone, unspecified: Secondary | ICD-10-CM | POA: Diagnosis not present

## 2019-09-19 DIAGNOSIS — I82401 Acute embolism and thrombosis of unspecified deep veins of right lower extremity: Secondary | ICD-10-CM | POA: Diagnosis not present

## 2019-09-19 DIAGNOSIS — G8929 Other chronic pain: Secondary | ICD-10-CM | POA: Diagnosis not present

## 2019-09-19 DIAGNOSIS — J189 Pneumonia, unspecified organism: Secondary | ICD-10-CM | POA: Diagnosis not present

## 2019-09-19 DIAGNOSIS — H3530 Unspecified macular degeneration: Secondary | ICD-10-CM | POA: Diagnosis not present

## 2019-09-19 DIAGNOSIS — R2681 Unsteadiness on feet: Secondary | ICD-10-CM | POA: Diagnosis not present

## 2019-09-19 DIAGNOSIS — N390 Urinary tract infection, site not specified: Secondary | ICD-10-CM | POA: Diagnosis not present

## 2019-09-19 DIAGNOSIS — E039 Hypothyroidism, unspecified: Secondary | ICD-10-CM | POA: Diagnosis not present

## 2019-09-19 DIAGNOSIS — T148XXA Other injury of unspecified body region, initial encounter: Secondary | ICD-10-CM | POA: Diagnosis not present

## 2019-09-19 DIAGNOSIS — K30 Functional dyspepsia: Secondary | ICD-10-CM | POA: Diagnosis not present

## 2019-09-19 DIAGNOSIS — G4700 Insomnia, unspecified: Secondary | ICD-10-CM | POA: Diagnosis not present

## 2019-09-19 DIAGNOSIS — M25552 Pain in left hip: Secondary | ICD-10-CM | POA: Diagnosis not present

## 2019-09-19 DIAGNOSIS — E785 Hyperlipidemia, unspecified: Secondary | ICD-10-CM | POA: Diagnosis not present

## 2019-09-19 DIAGNOSIS — Z7189 Other specified counseling: Secondary | ICD-10-CM | POA: Diagnosis not present

## 2019-09-19 DIAGNOSIS — R1314 Dysphagia, pharyngoesophageal phase: Secondary | ICD-10-CM | POA: Diagnosis not present

## 2019-09-19 DIAGNOSIS — M792 Neuralgia and neuritis, unspecified: Secondary | ICD-10-CM | POA: Diagnosis not present

## 2019-09-19 DIAGNOSIS — S3282XD Multiple fractures of pelvis without disruption of pelvic ring, subsequent encounter for fracture with routine healing: Secondary | ICD-10-CM | POA: Diagnosis not present

## 2019-09-19 DIAGNOSIS — C7652 Malignant neoplasm of left lower limb: Secondary | ICD-10-CM | POA: Diagnosis not present

## 2019-09-19 DIAGNOSIS — G3184 Mild cognitive impairment, so stated: Secondary | ICD-10-CM | POA: Diagnosis not present

## 2019-09-19 DIAGNOSIS — R569 Unspecified convulsions: Secondary | ICD-10-CM | POA: Diagnosis not present

## 2019-09-19 DIAGNOSIS — Z515 Encounter for palliative care: Secondary | ICD-10-CM | POA: Diagnosis not present

## 2019-09-19 DIAGNOSIS — R739 Hyperglycemia, unspecified: Secondary | ICD-10-CM | POA: Diagnosis not present

## 2019-09-19 DIAGNOSIS — E875 Hyperkalemia: Secondary | ICD-10-CM | POA: Diagnosis not present

## 2019-09-19 DIAGNOSIS — H04123 Dry eye syndrome of bilateral lacrimal glands: Secondary | ICD-10-CM | POA: Diagnosis not present

## 2019-09-19 MED ORDER — OXYCODONE HCL 5 MG PO TABS: 5.0000 mg | ORAL_TABLET | ORAL | 0 refills | Status: AC | PRN

## 2019-09-19 MED ORDER — TRAMADOL HCL 50 MG PO TABS: 50.0000 mg | ORAL_TABLET | Freq: Four times a day (QID) | ORAL | 0 refills | Status: AC | PRN

## 2019-09-19 NOTE — Progress Notes (Signed)
Patient discharged via EMS. Anish Vana S, RN  

## 2019-09-19 NOTE — Progress Notes (Signed)
Hematology/Oncology Progress Note [facility] [phone] [phone]  Patient Care Team: Pleas Koch, NP as PCP - General (Internal Medicine)   Name of the patient: Brittany Hughes  [id number]  1927/12/06  Date of visit: 09/19/19   INTERVAL HISTORY-  Patient appears comfortable.  No new complaints. I was called by palliative care service Chrystal that patient's power of attorney Terri's wants to further discuss with me about patient's path reports lab work-up results.    Review of systems- Review of Systems  Unable to perform ROS: Dementia  Constitutional: Positive for fatigue.  HENT:   Positive for hearing loss.   Respiratory: Positive for shortness of breath.   Musculoskeletal:       Pelvis pain    Allergies  Allergen Reactions  . Codeine Other (See Comments) and Nausea And Vomiting    nausea  . Colace [Docusate Calcium] Nausea Only    Patient Active Problem List   Diagnosis Date Noted  . Multiple closed fractures of pelvis ([place])   . Bone lesion   . Lung mass   . Left hip pain   . Pneumonia 09/12/2019  . Hemorrhoids 02/15/2018  . Allergic rhinitis 02/15/2018  . Urinary incontinence 10/03/2017  . Abnormality of gait 06/20/2016  . Chronic constipation 04/18/2016  . Hyperglycemia 04/18/2016  . Anemia 04/18/2016  . Hypothyroidism 04/18/2016  . IBS (irritable bowel syndrome) 09/29/2014  . Essential hypertension 09/29/2014  . Neuropathy 09/29/2014  . Insomnia 09/29/2014  . Leg edema, left 04/07/2014  . Dementia, vascular, with depression ([place]) 03/11/2014  . GERD (gastroesophageal reflux disease) 01/06/2014  . Frequent falls 01/06/2014  . Hyperlipidemia with target LDL less than 100 11/12/2013  . Arthritis of left knee 11/12/2013  . Atrophy of vulva 02/18/2013  . Malunion and nonunion of fracture(733.8) 02/18/2013  . Vitamin D deficiency 02/18/2013  . Chronic kidney disease, stage III (moderate) 02/18/2013  .  Other B-complex deficiencies 02/18/2013  . Tobacco use disorder 02/18/2013  . Other diseases of vocal cords 02/18/2013  . Anxiety and depression 02/18/2013  . Chronic pain syndrome 02/18/2013  . Deafness or hearing loss of type classifiable to 389.0 with type classifiable to 389.1 02/18/2013  . Occlusion and stenosis of carotid artery without mention of cerebral infarction 02/18/2013     Past Medical History:  Diagnosis Date  . Atrophy of vulva   . Baker's cyst 04/18/2016  . Carotid stenosis   . Chronic kidney disease   . Depression   . Dermatophytosis of nail   . DVT (deep venous thrombosis) ([place]) 01/06/2014   RLE  . Dysuria   . GERD (gastroesophageal reflux disease)   . Hyperglycemia 04/18/2016  . Hyperlipidemia   . Hyperpotassemia   . Hypertension   . Macular degeneration    "left eye; growth behind that" (01/06/2014)  . Mild cognitive impairment, so stated   . Nephrolithiasis   . Osteoporosis, unspecified   . Other B-complex deficiencies   . Other diseases of larynx   . Other voice and resonance disorders   . Reflux esophagitis   . Right wrist fracture 01/06/2014  . Seizures ([place])    "one, years ago" (01/06/2014)  . Unspecified hereditary and idiopathic peripheral neuropathy   . Unspecified malignant neoplasm of skin, site unspecified    left foot  . Unspecified urinary incontinence   . Vitamin deficiency      Past Surgical History:  Procedure Laterality Date  . CAROTID ENDARTERECTOMY Right 2006  . CATARACT EXTRACTION W/  INTRAOCULAR LENS  IMPLANT, BILATERAL Bilateral 1990's  . DILATION AND CURETTAGE OF UTERUS    . EXCISIONAL HEMORRHOIDECTOMY  1970's?  Marland Kitchen SACROPLASTY N/A 09/16/2019   Procedure: SACROPLASTY;  Surgeon: Hessie Knows, MD;  Location: [facility] ORS;  Service: Orthopedics;  Laterality: N/A;  . STOMACH SURGERY  ~ 1960   "had ulcers for years; left scar tissue; did OR" (01/06/2014)  . VAGINAL HYSTERECTOMY  ~ 1980    Social History   Socioeconomic History  .  Marital status: Widowed    Spouse name: Not on file  . Number of children: Not on file  . Years of education: Not on file  . Highest education level: Not on file  Occupational History  . Not on file  Social Needs  . Financial resource strain: Not on file  . Food insecurity    Worry: Not on file    Inability: Not on file  . Transportation needs    Medical: Not on file    Non-medical: Not on file  Tobacco Use  . Smoking status: Former Smoker    Packs/day: 0.50    Years: 60.00    Pack years: 30.00    Types: Cigarettes    Quit date: 10/31/2011    Years since quitting: 7.8  . Smokeless tobacco: Never Used  Substance and Sexual Activity  . Alcohol use: No  . Drug use: No  . Sexual activity: Never  Lifestyle  . Physical activity    Days per week: Not on file    Minutes per session: Not on file  . Stress: Not on file  Relationships  . Social Herbalist on phone: Not on file    Gets together: Not on file    Attends religious service: Not on file    Active member of club or organization: Not on file    Attends meetings of clubs or organizations: Not on file    Relationship status: Not on file  . Intimate partner violence    Fear of current or ex partner: Not on file    Emotionally abused: Not on file    Physically abused: Not on file    Forced sexual activity: Not on file  Other Topics Concern  . Not on file  Social History Narrative  . Not on file     Family History  Problem Relation Age of Onset  . Heart disease Mother 68  . Kidney disease Father   . Diabetes Sister   . Heart disease Brother 49  . Heart attack Son 52  . Cancer Son        Throat  . Stroke Brother   . Cancer Brother   . Cancer Sister        Kidney     Current Facility-Administered Medications:  .  acetaminophen (TYLENOL) tablet 650 mg, 650 mg, Oral, Q6H PRN, 650 mg at 09/17/19 0626 **OR** acetaminophen (TYLENOL) suppository 650 mg, 650 mg, Rectal, Q6H PRN, Hessie Knows, MD .   albuterol (PROVENTIL) (2.5 MG/3ML) 0.083% nebulizer solution 2.5 mg, 2.5 mg, Nebulization, Q4H PRN, Hessie Knows, MD .  amitriptyline (ELAVIL) tablet 75 mg, 75 mg, Oral, QHS, Hessie Knows, MD, 75 mg at 09/18/19 2216 .  bisacodyl (DULCOLAX) suppository 10 mg, 10 mg, Rectal, Daily PRN, Hessie Knows, MD .  ceFAZolin (ANCEF) IVPB 1 g/50 mL premix, 1 g, Intravenous, Once, Hessie Knows, MD .  cefTRIAXone (ROCEPHIN) 1 g in sodium chloride 0.9 % 100 mL IVPB, 1 g, Intravenous, Q24H, Menz,  Legrand Como, MD, Last Rate: 200 mL/hr at 09/18/19 1351, 1 g at 09/18/19 1351 .  docusate sodium (COLACE) capsule 100 mg, 100 mg, Oral, Daily, Hessie Knows, MD, 100 mg at 09/19/19 1041 .  enoxaparin (LOVENOX) injection 40 mg, 40 mg, Subcutaneous, Q24H, Gherghe, Costin M, MD, 40 mg at 09/18/19 2215 .  gabapentin (NEURONTIN) capsule 300 mg, 300 mg, Oral, QHS, Hessie Knows, MD, 300 mg at 09/18/19 2216 .  hydrALAZINE (APRESOLINE) injection 10 mg, 10 mg, Intravenous, Q6H PRN, Jani Gravel, MD .  HYDROcodone-acetaminophen [facility]) 10-325 MG per tablet 1-2 tablet, 1-2 tablet, Oral, Q4H PRN, Hessie Knows, MD, 2 tablet at 09/19/19 1044 .  ipratropium-albuterol (DUONEB) 0.5-2.5 (3) MG/3ML nebulizer solution 3 mL, 3 mL, Nebulization, BID, Hessie Knows, MD, 3 mL at 09/19/19 0736 .  levothyroxine (SYNTHROID) tablet 25 mcg, 25 mcg, Oral, Q0600, Hessie Knows, MD, 25 mcg at 09/19/19 0626 .  Melatonin TABS 5 mg, 5 mg, Oral, Daily, Hessie Knows, MD, 5 mg at 09/18/19 2219 .  metoprolol succinate (TOPROL-XL) 24 hr tablet 50 mg, 50 mg, Oral, Daily, Hessie Knows, MD, 50 mg at 09/19/19 1041 .  morphine 2 MG/ML injection 1-2 mg, 1-2 mg, Intravenous, Q4H PRN, Caren Griffins, MD, 2 mg at 09/18/19 0401 .  ondansetron (ZOFRAN) tablet 4 mg, 4 mg, Oral, Q6H PRN **OR** ondansetron (ZOFRAN) injection 4 mg, 4 mg, Intravenous, Q6H PRN, Hessie Knows, MD, 4 mg at 09/17/19 1636 .  oxyCODONE (Oxy IR/ROXICODONE) immediate release tablet 5 mg, 5 mg, Oral,  Q4H PRN, Gherghe, Costin M, MD .  pantoprazole (PROTONIX) EC tablet 40 mg, 40 mg, Oral, Daily, Hessie Knows, MD, 40 mg at 09/19/19 1041 .  polyethylene glycol (MIRALAX / GLYCOLAX) packet 17 g, 17 g, Oral, Daily, Hessie Knows, MD, 17 g at 09/18/19 1030 .  promethazine (PHENERGAN) tablet 12.5 mg, 12.5 mg, Oral, Q12H PRN, Hessie Knows, MD .  senna-docusate (Senokot-S) tablet 1 tablet, 1 tablet, Oral, QHS PRN, Hessie Knows, MD, 1 tablet at 09/14/19 2130 .  traZODone (DESYREL) tablet 50 mg, 50 mg, Oral, QHS PRN, Jani Gravel, MD, 50 mg at 09/18/19 2217 .  vitamin B-12 (CYANOCOBALAMIN) tablet 1,000 mcg, 1,000 mcg, Oral, Daily, Hessie Knows, MD, 1,000 mcg at 09/19/19 1042   Physical exam:  Vitals:   09/19/19 0424 09/19/19 0736 09/19/19 0757 09/19/19 0911  BP: (!) 176/75  (!) 138/55 (!) 141/64  Pulse: 86  77 82  Resp: 20  17   Temp: 98.4 F (36.9 C)  97.7 F (36.5 C) 98 F (36.7 C)  TempSrc:   Oral Oral  SpO2: 95% 91% 98% 95%  Weight:      Height:       Physical Exam  Constitutional: NAD HENT:  Head: Normocephalic and atraumatic.  Chronic hearing loss Eyes: Pupils are equal, round, and reactive to light. No scleral icterus.  Neck: Normal range of motion. Neck supple.  Pulmonary/Chest: Effort normal. No respiratory stress. Breathing comfortably via nasal cannula oxygen.  Abdominal: Soft.  Musculoskeletal:        General: No edema.  Neurological: She is alert.  Oriented x 2 Skin: Skin is warm and dry.  Psychiatric: Affect normal.    CMP Latest Ref Rng & Units 09/18/2019  Glucose 70 - 99 mg/dL 100(H)  BUN 8 - 23 mg/dL 16  Creatinine 0.44 - 1.00 mg/dL 0.84  Sodium 135 - 145 mmol/L 138  Potassium 3.5 - 5.1 mmol/L 3.6  Chloride 98 - 111 mmol/L 103  CO2 22 - 32  mmol/L 24  Calcium 8.9 - 10.3 mg/dL 9.2  Total Protein 6.5 - 8.1 g/dL -  Total Bilirubin 0.3 - 1.2 mg/dL -  Alkaline Phos 38 - 126 U/L -  AST 15 - 41 U/L -  ALT 0 - 44 U/L -   CBC Latest Ref Rng & Units 09/18/2019   WBC 4.0 - 10.5 K/uL 8.0  Hemoglobin 12.0 - 15.0 g/dL 9.2(L)  Hematocrit 36.0 - 46.0 % 30.0(L)  Platelets 150 - 400 K/uL 194    RADIOGRAPHIC STUDIES: I have personally reviewed the radiological images as listed and agreed with the findings in the report.   Dg Chest 1 View  Result Date: 09/12/2019 CLINICAL DATA:  Peribronchovascular nodularity and consolidation in the right lower lobe on an abdomen pelvis CT earlier today. EXAM: CHEST  1 VIEW COMPARISON:  Abdomen pelvis CT obtained earlier today and chest radiographs dated 03/10/2014. FINDINGS: Mildly enlarged cardiac silhouette with a mild increase in size since 03/20/2014. Patchy opacity and small amount of pleural fluid in the right lower lung zone. Minimal patchy opacity more superiorly in the right lung. Interval oval appearance of the soft tissues within or overlying the right hilum. Clear left lung. Multiple surgical clips in the region of the gastroesophageal junction. Unremarkable bones. IMPRESSION: 1. Mild right lung pneumonia and associated small pleural effusion. 2. Interval oval appearance of the soft tissues within or overlying the right hilum. This is suspicious for a possible mediastinal, hilar or lung mass. Further evaluation with a chest CT with contrast is recommended. Electronically Signed   By: Claudie Revering M.D.   On: 09/12/2019 15:35   Dg Lumbar Spine 2-3 Views  Result Date: 09/16/2019 CLINICAL DATA:  Sacroplasty. EXAM: LUMBAR SPINE - 2-3 VIEW; DG C-ARM 1-60 MIN Radiation exposure index: 10.10 mGy. COMPARISON:  None. FINDINGS: Single intraoperative fluoroscopic image was obtained of the sacrum. This demonstrates the patient be status post bilateral sacroplasty. IMPRESSION: Fluoroscopic guidance provided during bilateral sacroplasty. Electronically Signed   By: Marijo Conception M.D.   On: 09/16/2019 15:30   Ct Chest Wo Contrast  Result Date: 09/13/2019 CLINICAL DATA:  Abnormal chest radiograph EXAM: CT CHEST WITHOUT CONTRAST  TECHNIQUE: Multidetector CT imaging of the chest was performed following the standard protocol without IV contrast. COMPARISON:  Chest radiograph dated 09/12/2019. CT abdomen/pelvis dated 09/12/2019. FINDINGS: Cardiovascular: Heart is normal in size.  No pericardial effusion. No evidence of thoracic aortic aneurysm. Atherosclerotic calcifications of the aortic arch. Three vessel coronary atherosclerosis. Mediastinum/Nodes: Dominant 7.8 x 4.9 cm right perihilar mass extending to the mediastinum (series 2/image 68), suspicious for primary bronchogenic neoplasm. Associated 1.8 cm subcarinal node (series 2/image 61) and bulky paratracheal nodes measuring up to 2.7 cm (series 2/image 45). Visualized thyroid is unremarkable. Lungs/Pleura: Right infrahilar masslike opacity measuring up to 4.6 x 6.2 cm (series 3/image 91), suspicious for primary bronchogenic neoplasm. Associated postobstructive right lower lobe opacity/atelectasis, infection/pneumonia not excluded. Moderate right pleural effusion. Ground-glass opacity in the right upper and middle lobes. Left lung is clear. No pneumothorax. Upper Abdomen: Unchanged from recent CT, including a 17 mm left adrenal nodule, favoring a metastasis. Musculoskeletal: No focal osseous lesions. IMPRESSION: 6.2 cm right infrahilar masslike opacity, suspicious for primary bronchogenic neoplasm. 7.8 cm right perihilar masslike opacity extending to the mediastinum, likely reflecting associated nodal metastases. Additional mediastinal nodal metastases, including a bulky paratracheal node, as above. Associated postobstructive opacities in the right lung. Moderate right pleural effusion. 17 mm left adrenal nodule, favoring a metastasis. Aortic Atherosclerosis (  ICD10-I70.0). Electronically Signed   By: Julian Hy M.D.   On: 09/13/2019 16:02   US Pelvis (transabdominal Only)  Result Date: 09/13/2019 CLINICAL DATA:  Enlarged right ovary on CT EXAM: TRANSABDOMINAL ULTRASOUND OF PELVIS  TECHNIQUE: Transabdominal ultrasound examination of the pelvis was performed including evaluation of the uterus, ovaries, adnexal regions, and pelvic cul-de-sac. COMPARISON:  None. FINDINGS: Uterus Surgically absent. Right ovary Measurements: 3.0 x 2.1 x 3.3 cm = volume: 11 mL. Poorly visualized but grossly unremarkable. Left ovary Not discretely visualized.  No adnexal mass is seen. Other findings:  No abnormal free fluid. IMPRESSION: Right ovary is poorly visualized but grossly unremarkable on ultrasound. Left ovary is not discretely visualized but normal on recent CT. Status post hysterectomy. Electronically Signed   By: Julian Hy M.D.   On: 09/13/2019 16:05   Dg C-arm 1-60 Min  Result Date: 09/16/2019 CLINICAL DATA:  Sacroplasty. EXAM: LUMBAR SPINE - 2-3 VIEW; DG C-ARM 1-60 MIN Radiation exposure index: 10.10 mGy. COMPARISON:  None. FINDINGS: Single intraoperative fluoroscopic image was obtained of the sacrum. This demonstrates the patient be status post bilateral sacroplasty. IMPRESSION: Fluoroscopic guidance provided during bilateral sacroplasty. Electronically Signed   By: Marijo Conception M.D.   On: 09/16/2019 15:30   Ct Renal Stone Study  Result Date: 09/12/2019 CLINICAL DATA:  Left pelvic pain for the past month. EXAM: CT ABDOMEN AND PELVIS WITHOUT CONTRAST TECHNIQUE: Multidetector CT imaging of the abdomen and pelvis was performed following the standard protocol without IV contrast. COMPARISON:  CT abdomen pelvis dated December 21, 2015. FINDINGS: Lower chest: Peribronchovascular nodularity and consolidation in the right lower lobe. Trace right pleural effusion. Hepatobiliary: No focal liver abnormality is seen. No gallstones, gallbladder wall thickening, or biliary dilatation. Pancreas: Mild atrophy. No ductal dilatation or surrounding inflammatory changes. Spleen: Normal in size without focal abnormality. Adrenals/Urinary Tract: New indeterminate 1.6 x 1.5 cm left adrenal nodule. The  right adrenal gland is unremarkable. 9 mm calculus in the right renal pelvis. No hydronephrosis. Subcentimeter hemorrhagic/proteinaceous left renal cyst again noted. Mild circumferential bladder wall thickening. Stomach/Bowel: Surgical clips near the gastroesophageal junction. Stomach is within normal limits. Appendix appears normal. No evidence of bowel wall thickening, distention, or inflammatory changes. Vascular/Lymphatic: Aortic atherosclerosis. No enlarged abdominal or pelvic lymph nodes. Reproductive: Status post hysterectomy. Mild asymmetric enlargement of the right ovary, increased in size since 2017, with small areas of hypodensity. The left ovary is unremarkable. Other: No abdominal wall hernia or abnormality. No abdominopelvic ascites. No pneumoperitoneum. Musculoskeletal: Acute to subacute fractures of the bilateral sacral ala, S1 and S4 vertebral bodies, right parasymphyseal pubic bone, and left inferior pubic ramus. Cortical irregularity and indistinctness of the right parasymphyseal pubic bone and left inferior pubic ramus. Cortical irregularity and destruction of the right anterior iliac wing with small adjacent soft tissue mass. Small lytic lesion in the left inferior sacral ala (series 2, image 61). Possible acute to subacute small corner fracture of the L5 anterior inferior endplate. Chronic appearing moderate L1 superior endplate compression deformity, new since 2017. IMPRESSION: 1. Multiple acute to subacute pelvic fractures involving the S1 and S4 vertebral bodies, bilateral sacral ala, right parasymphyseal pubic bone, and left inferior pubic ramus. While the sacral fractures are likely insufficiency related, bony irregularity involving the right parasymphyseal pubic bone, left inferior pubic ramus, and right anterior iliac wing are concerning for underlying myeloma or metastatic disease. The right anterior iliac wing lesion has a small soft tissue component. Additional small lytic lesion in  the  left inferior sacral ala. 2. Possible acute to subacute small corner fracture of the L5 anterior inferior endplate. 3. Chronic appearing moderate L1 superior endplate compression deformity, new since 2017. 4. Right lower lobe pneumonia.  Trace right pleural effusion. 5. Mild asymmetric enlargement of the right ovary, increased in size since 2017. Non-emergent outpatient pelvic ultrasound suggested for further evaluation. 6. New indeterminate 1.6 cm left adrenal nodule. In light of the bony pelvis findings, metastatic disease is not excluded. 7. 9 mm calculus in the right renal pelvis.  No hydronephrosis. 8.  Aortic atherosclerosis (ICD10-I70.0). Electronically Signed   By: Titus Dubin M.D.   On: 09/12/2019 12:57    Assessment and plan-  Patient is a 85 y.o. female presented for evaluation of left side pain.    #Abnormal CT,  CT chest abdomen pelvis showed findings concerning for bulky right infrahilar mass as well as nodal metastasis, as well as acute and subacute pelvic fractures, vertebral compression fractures, adrenal nodule. Ultrasound pelvis showed grossly unremarkable right ovary. Clinically she has lung cancer. Multiple myeloma panel negative for monoclonal proteins, free light chain ratios are normal.  Less likely multiple myeloma. I called patient's power of attorney Terri for discussion.  I also discussed with patient at the bedside. Sputum cytology was reviewed.  I explained to her that sputum cytology has low yield, negative sputum cytology does not rule out lung cancer. Patient underwent sacrum biopsy which showed viable bone and trilineage hematopoietic marrow.  No evidence of metastatic carcinoma. Explained to [place] that patient's sacrum biopsy did not show cancer, her pelvic fractures can be secondary to bone insufficiency, or this can be a false negative biopsy.  Since patient/power of attorney have decided not to proceed with additional biopsy, we would not be able to confirm  cancer diagnosis with a biopsy.  Continue palliative care. pain control.  Thank you for allowing me to participate in the care of this patient.   Earlie Server, MD, PhD Hematology Oncology [facility] at [facility] Pager- [phone] 09/19/2019

## 2019-09-19 NOTE — TOC Transition Note (Addendum)
Transition of Care [facility]) - CM/SW Discharge Note   Patient Details  Name: Brittany Hughes MRN: [id number] Date of Birth: 1928-09-21  Transition of [facility]) CM/SW Contact:  Oiva Dibari, Lenice Llamas Phone Number: [phone]  09/19/2019, 3:02 PM   Clinical Narrative: Patient is medically stable for D/C to [facility] in [place] today. Per [facility] admissions coordinator at [facility] patient can come today to room 307. Levada Dy is aware that patient had a negative covid test on 11/12. RN will call report and arrange EMS for transport. Clinical Education officer, museum (CSW) sent D/C orders to [facility] via [place]. Patient is aware of above. CSW contacted patient's [name] and made her aware of above. Patient will have outpatient palliative care to follow at SNF. Sandi Raveling liaison is aware of above. Please reconsult if future social work needs arise. CSW signing off.     Final next level of care: Skilled Nursing Facility Barriers to Discharge: Barriers Resolved   Patient Goals and CMS Choice   CMS Medicare.gov Compare Post Acute Care list provided to:: Patient Choice offered to / list presented to : Patient  Discharge Placement   Existing PASRR number confirmed : 09/16/19          Patient chooses bed at: [facility] Patient to be transferred to facility by: [facility] Name of family member notified: Patient's [name] is aware of D/C today. Patient and family notified of of transfer: 09/19/19  Discharge Plan and Services   Discharge Planning Services: CM Consult Post Acute Care Choice: Home Health          DME Arranged: N/A         HH Arranged: NA [place] Agency: [place] ([place]) Date HH Agency Contacted: 09/13/19 Time [place]: 1133 Representative spoke with at [place]: [place] Interventions     Readmission Risk Interventions Readmission Risk Prevention Plan 09/13/2019  Post Dischage  Appt Complete  Medication Screening Complete  Transportation Screening Complete  Some recent data might be hidden

## 2019-09-19 NOTE — Progress Notes (Signed)
Daily Progress Note   Patient Name: Brittany Hughes       Date: 09/19/2019 DOB: 10-10-1928  Age: 85 y.o. MRN#: [id number] Attending Physician: Caren Griffins, MD Primary Care Physician: Pleas Koch, NP Admit Date: 09/12/2019  Reason for Consultation/Follow-up: Establishing goals of care  Subjective: Patient is resting in bed. She states she is happy to be going to SNF. She states she does not want to prolong suffering and wants "nothing that's going to be over and over" when discussing returning to the hospital. She is a bit confused today and states this confusion to me. She would like to know how Karna Christmas feels about her care moving forward after SNF. Some of the pathology reports have resulted. Karna Christmas is eager to discuss the results that are back. Spoke with Dr. Tasia Catchings who will reach out to [place].    Recommend palliative at D/C to SNF with transition to hospice when ready.   Length of Stay: 7  Current Medications: Scheduled Meds:  . amitriptyline  75 mg Oral QHS  . docusate sodium  100 mg Oral Daily  . enoxaparin (LOVENOX) injection  40 mg Subcutaneous Q24H  . gabapentin  300 mg Oral QHS  . ipratropium-albuterol  3 mL Nebulization BID  . levothyroxine  25 mcg Oral Q0600  . Melatonin  5 mg Oral Daily  . metoprolol succinate  50 mg Oral Daily  . pantoprazole  40 mg Oral Daily  . polyethylene glycol  17 g Oral Daily  . vitamin B-12  1,000 mcg Oral Daily    Continuous Infusions: .  ceFAZolin (ANCEF) IV    . cefTRIAXone (ROCEPHIN)  IV 1 g (09/18/19 1351)    PRN Meds: acetaminophen **OR** acetaminophen, albuterol, bisacodyl, hydrALAZINE, HYDROcodone-acetaminophen, morphine injection, ondansetron **OR** ondansetron (ZOFRAN) IV, oxyCODONE, promethazine, senna-docusate, traZODone   Physical Exam Pulmonary:     Effort: Pulmonary effort is normal.  Skin:    General: Skin is warm and dry.  Neurological:     Mental Status: She is alert.             Vital Signs: BP (!) 141/64 (BP Location: Right Arm)   Pulse 82   Temp 98 F (36.7 C) (Oral)   Resp 17   Ht 5\' 2"  (1.575 m)   Wt 68 kg   SpO2  95%   BMI 27.44 kg/m  SpO2: SpO2: 95 % O2 Device: O2 Device: Nasal Cannula O2 Flow Rate: O2 Flow Rate (L/min): 2 L/min  Intake/output summary:  No intake or output data in the 24 hours ending 09/19/19 1049 LBM: Last BM Date: 09/19/19 Baseline Weight: Weight: 68 kg Most recent weight: Weight: 68 kg       Palliative Assessment/Data: 50%      Patient Active Problem List   Diagnosis Date Noted  . Multiple closed fractures of pelvis ([place])   . Bone lesion   . Lung mass   . Left hip pain   . Pneumonia 09/12/2019  . Hemorrhoids 02/15/2018  . Allergic rhinitis 02/15/2018  . Urinary incontinence 10/03/2017  . Abnormality of gait 06/20/2016  . Chronic constipation 04/18/2016  . Hyperglycemia 04/18/2016  . Anemia 04/18/2016  . Hypothyroidism 04/18/2016  . IBS (irritable bowel syndrome) 09/29/2014  . Essential hypertension 09/29/2014  . Neuropathy 09/29/2014  . Insomnia 09/29/2014  . Leg edema, left 04/07/2014  . Dementia, vascular, with depression ([place]) 03/11/2014  . GERD (gastroesophageal reflux disease) 01/06/2014  . Frequent falls 01/06/2014  . Hyperlipidemia with target LDL less than 100 11/12/2013  . Arthritis of left knee 11/12/2013  . Atrophy of vulva 02/18/2013  . Malunion and nonunion of fracture(733.8) 02/18/2013  . Vitamin D deficiency 02/18/2013  . Chronic kidney disease, stage III (moderate) 02/18/2013  . Other B-complex deficiencies 02/18/2013  . Tobacco use disorder 02/18/2013  . Other diseases of vocal cords 02/18/2013  . Anxiety and depression 02/18/2013  . Chronic pain syndrome 02/18/2013  . Deafness or hearing loss of type classifiable  to 389.0 with type classifiable to 389.1 02/18/2013  . Occlusion and stenosis of carotid artery without mention of cerebral infarction 02/18/2013    Palliative Care Assessment & Plan   Recommendations/Plan:  DNR/DNI. Would be amenable to SNF admission with palliative and transition to hospice depending on outcome of SNF. They would like [facility].      Code Status:    Code Status Orders  (From admission, onward)         Start     Ordered   09/15/19 1438  Do not attempt resuscitation (DNR)  Continuous    Question Answer Comment  In the event of cardiac or respiratory ARREST Do not call a "code blue"   In the event of cardiac or respiratory ARREST Do not perform Intubation, CPR, defibrillation or ACLS   In the event of cardiac or respiratory ARREST Use medication by any route, position, wound care, and other measures to relive pain and suffering. May use oxygen, suction and manual treatment of airway obstruction as needed for comfort.      09/15/19 1437        Code Status History    Date Active Date Inactive Code Status Order ID Comments User Context   09/12/2019 1412 09/15/2019 1437 Full Code [id number]  Karie Kirks, DO ED   01/06/2014 1844 01/09/2014 1433 Full Code [id number]  Delfina Redwood, MD Inpatient   Advance Care Planning Activity    Advance Directive Documentation     Most Recent Value  Type of Advance Directive  Healthcare Power of Attorney, Living will  Pre-existing out of facility DNR order (yellow form or pink MOST form)  -  "MOST" Form in Place?  -       Prognosis:  < 6 months. Findings consistent with cancer and not a candidate for chemotherapy or surgery, nor does she want interventions.  Postobstructive process and lower lobe opacity associated with infrahilar mass. Albumin 2.8. Pelvic fractures affecting mobility.   Discharge Planning:  SNF with palliative.    Thank you for allowing the Palliative Medicine Team to assist in the care of this  patient.   Time In: 9:20 Time Out: 10:40 Total Time 70 min Prolonged Time Billed yes      Greater than 50%  of this time was spent counseling and coordinating care related to the above assessment and plan.  Asencion Gowda, NP  Please contact Palliative Medicine Team phone at [phone] for questions and concerns.

## 2019-09-19 NOTE — Progress Notes (Signed)
EMS called for transport to [facility] in [place]. Madlyn Frankel, RN

## 2019-09-19 NOTE — Discharge Summary (Signed)
Physician Discharge Summary  MARISELLA PUCCIO FBX:[id number] DOB: December 09, 1927 DOA: 09/12/2019  PCP: Pleas Koch, NP  Admit date: 09/12/2019 Discharge date: 09/19/2019  Admitted From: home Disposition:  SNF  Recommendations for Outpatient Follow-up:  1. Follow up with PCP in 1-2 weeks 2. Recommend palliative care to follow patient while at [place]: None Equipment/Devices: None  Discharge Condition: Stable CODE STATUS: DNR Diet recommendation: Heart healthy  HPI: Per admitting MD, The patient is a 85 yr old woman who lives at home on her home with help from her niece and others among her "people", as she calls them, that help her. The patient has a past medical history significant for DVT of the right lower extremity, GERD, Hyperlipidemia, Hypertension, Mild cognitive impairment, osteoporosis, seizures, Skin cancer of her left foot. She states that she does not drink alcohol, and she quit smoking one year ago. In the ED the patient is found to have multiple pelvic fractures. Some of these appear to be related to osteopenia. Some of these are suspicious for possible multiple myeloma or metastasis. She is also found to have a right lower lobe pneumonia and UTI. There is also a left adnexal mass. She denies fever, chills, nausea, vomiting, constipation, or diarrhea. No melena, hematochezia, or coffee ground or hematemesis. She denies chest pain, or sputum production. She states that she has had no difficulty with eating or drinking. She states that she has gained about 10 lbs in the last year.  Hospital Course / Discharge diagnoses: Principal Problem Right lower lobe pneumonia with acute hypoxic respiratory failure -patient was initiated on intravenous antibiotics with ceftriaxone and azithromycin, and has completed 7 days of treatment while hospitalized.  Her respiratory status seems to be stable, she is satting 98% on 2 L, she is afebrile and her leukocytosis is improving.   Continue supportive care, encourage incentive spirometry, wean off oxygen as she becomes more mobile with physical therapy.  Active Problems Urinary tract infection -Symptoms improved, urine cultures with 10 to the fifth colonies of Aerococcus urinary, current antibiotic regimen covered. Multiple pelvic fractures /sacral fracture -Status post sacral plasty by orthopedic surgery on 09/16/2019.  Continue pain control as prescribed below, to be discharged to SNF Concern for cancer/metastatic disease -Per prior discussions by Dr. Benny Lennert and POA as well as pulmonology would not pursue aggressive work-up, she is a very poor candidate for systemic therapy.  Biopsy of the sacrum done intraoperatively did not show any evidence of malignancy, as well as negative sputum collection looking for malignant cells Hypothyroidism -continue Synthroid Hypertension -Continue home medications Mild dementia -Stable  A. BONE, SACRUM; BIOPSY:  - VIABLE BONE AND TRILINEAGE HEMATOPOIETIC MARROW.  - NO EVIDENCE OF METASTATIC CARCINOMA.   DIAGNOSIS:  A. SPUTUM; INDUCED COLLECTION:  - MUCUS, ABUNDANT BACTERIA, FUNGAL YEAST AND HYPHAE, SMALL COLLECTIONS  OF NEUTROPHILS, AND RARE MINUTE FRAGMENTS OF BENIGN SQUAMOUS EPITHELIUM.  - FINDINGS ARE REPRESENTATIVE OF OROPHARYNGEAL SAMPLING.  - NO PULMONARY MACROPHAGES OR RESPIRATORY EPITHELIUM PRESENT.  - NO MALIGNANCY IDENTIFIED.  COVID-19 Labs  Lab Results  Component Value Date   [place] 09/18/2019   [place] NEGATIVE 09/12/2019     Discharge Instructions   Allergies as of 09/19/2019      Reactions   Codeine Other (See Comments), Nausea And Vomiting   nausea   Colace [docusate Calcium] Nausea Only      Medication List    TAKE these medications   Amitiza 24 MCG capsule Generic drug: lubiprostone TAKE 1  CAPSULE (24 MCG TOTAL) BY MOUTH 2 (TWO) TIMES DAILY WITH A MEAL. What changed: See the new instructions.   amitriptyline 75 MG  tablet Commonly known as: ELAVIL Take 1 tablet (75 mg total) by mouth at bedtime. For sleep.   antiseptic oral rinse Liqd 1 application by Mouth Rinse route every 4 (four) hours as needed for dry mouth.   cholecalciferol 1000 units tablet Commonly known as: VITAMIN D Take 1,000 Units by mouth daily.   docusate sodium 100 MG capsule Commonly known as: COLACE Take 100 mg by mouth daily.   gabapentin 300 MG capsule Commonly known as: NEURONTIN TAKE 1 CAPSULE BY MOUTH EVERYDAY AT BEDTIME What changed: See the new instructions.   hydrocortisone 25 MG suppository Commonly known as: ANUSOL-HC Place 1 suppository (25 mg total) rectally 2 (two) times daily.   levocetirizine 5 MG tablet Commonly known as: Xyzal Take 1 tablet (5 mg total) by mouth every evening. For allergies.   levothyroxine 25 MCG tablet Commonly known as: SYNTHROID 1 TAB EVERY MORNING ON AN EMPTY STOMACH WITH FULL GLASS OF WATER. NO FOOD/MEDS FOR 30 MINUTES. What changed:   how much to take  how to take this  when to take this  additional instructions   lisinopril 5 MG tablet Commonly known as: ZESTRIL TAKE 1 TABLET BY MOUTH EVERY DAY   Melatonin 3 MG Tabs Take 1 tablet (3 mg total) by mouth daily.   meloxicam 7.5 MG tablet Commonly known as: MOBIC Take 7.5 mg by mouth daily.   metoprolol succinate 50 MG 24 hr tablet Commonly known as: TOPROL-XL TAKE 1 TABLET BY MOUTH EVERY DAY   omeprazole 20 MG capsule Commonly known as: PRILOSEC TAKE 1 CAPSULE (20 MG TOTAL) BY MOUTH DAILY. FOR ACID REFLUX What changed:   how much to take  how to take this  when to take this  additional instructions   oxyCODONE 5 MG immediate release tablet Commonly known as: Oxy IR/ROXICODONE Take 1 tablet (5 mg total) by mouth every 4 (four) hours as needed for breakthrough pain.   promethazine 12.5 MG tablet Commonly known as: PHENERGAN TAKE 1 TABLET BY MOUTH EVERY 12 HOURS AS NEEDED FOR NAUSEA OR VOMITING What  changed: See the new instructions.   Systane 0.4-0.3 % Soln Generic drug: Polyethyl Glycol-Propyl Glycol Apply 1 drop to eye daily as needed (for dry eyes).   traMADol 50 MG tablet Commonly known as: ULTRAM Take 1 tablet (50 mg total) by mouth every 6 (six) hours as needed. What changed: reasons to take this   vitamin B-12 1000 MCG tablet Commonly known as: CYANOCOBALAMIN Take 1,000 mcg by mouth daily.      Contact information for after-discharge care    Destination    [facility] Preferred SNF .   Service: Skilled Nursing Contact information: [address] [place] [phone]              Consultations:  Orthopedic surgery   Procedures/Studies:  Sacral plasty 11/10  Dg Chest 1 View  Result Date: 09/12/2019 CLINICAL DATA:  Peribronchovascular nodularity and consolidation in the right lower lobe on an abdomen pelvis CT earlier today. EXAM: CHEST  1 VIEW COMPARISON:  Abdomen pelvis CT obtained earlier today and chest radiographs dated 03/10/2014. FINDINGS: Mildly enlarged cardiac silhouette with a mild increase in size since 03/20/2014. Patchy opacity and small amount of pleural fluid in the right lower lung zone. Minimal patchy opacity more superiorly in the right lung. Interval  oval appearance of the soft tissues within or overlying the right hilum. Clear left lung. Multiple surgical clips in the region of the gastroesophageal junction. Unremarkable bones. IMPRESSION: 1. Mild right lung pneumonia and associated small pleural effusion. 2. Interval oval appearance of the soft tissues within or overlying the right hilum. This is suspicious for a possible mediastinal, hilar or lung mass. Further evaluation with a chest CT with contrast is recommended. Electronically Signed   By: Claudie Revering M.D.   On: 09/12/2019 15:35   Dg Lumbar Spine 2-3 Views  Result Date: 09/16/2019 CLINICAL DATA:  Sacroplasty. EXAM: LUMBAR SPINE -  2-3 VIEW; DG C-ARM 1-60 MIN Radiation exposure index: 10.10 mGy. COMPARISON:  None. FINDINGS: Single intraoperative fluoroscopic image was obtained of the sacrum. This demonstrates the patient be status post bilateral sacroplasty. IMPRESSION: Fluoroscopic guidance provided during bilateral sacroplasty. Electronically Signed   By: Marijo Conception M.D.   On: 09/16/2019 15:30   Ct Chest Wo Contrast  Result Date: 09/13/2019 CLINICAL DATA:  Abnormal chest radiograph EXAM: CT CHEST WITHOUT CONTRAST TECHNIQUE: Multidetector CT imaging of the chest was performed following the standard protocol without IV contrast. COMPARISON:  Chest radiograph dated 09/12/2019. CT abdomen/pelvis dated 09/12/2019. FINDINGS: Cardiovascular: Heart is normal in size.  No pericardial effusion. No evidence of thoracic aortic aneurysm. Atherosclerotic calcifications of the aortic arch. Three vessel coronary atherosclerosis. Mediastinum/Nodes: Dominant 7.8 x 4.9 cm right perihilar mass extending to the mediastinum (series 2/image 68), suspicious for primary bronchogenic neoplasm. Associated 1.8 cm subcarinal node (series 2/image 61) and bulky paratracheal nodes measuring up to 2.7 cm (series 2/image 45). Visualized thyroid is unremarkable. Lungs/Pleura: Right infrahilar masslike opacity measuring up to 4.6 x 6.2 cm (series 3/image 91), suspicious for primary bronchogenic neoplasm. Associated postobstructive right lower lobe opacity/atelectasis, infection/pneumonia not excluded. Moderate right pleural effusion. Ground-glass opacity in the right upper and middle lobes. Left lung is clear. No pneumothorax. Upper Abdomen: Unchanged from recent CT, including a 17 mm left adrenal nodule, favoring a metastasis. Musculoskeletal: No focal osseous lesions. IMPRESSION: 6.2 cm right infrahilar masslike opacity, suspicious for primary bronchogenic neoplasm. 7.8 cm right perihilar masslike opacity extending to the mediastinum, likely reflecting associated  nodal metastases. Additional mediastinal nodal metastases, including a bulky paratracheal node, as above. Associated postobstructive opacities in the right lung. Moderate right pleural effusion. 17 mm left adrenal nodule, favoring a metastasis. Aortic Atherosclerosis (ICD10-I70.0). Electronically Signed   By: Julian Hy M.D.   On: 09/13/2019 16:02   US Pelvis (transabdominal Only)  Result Date: 09/13/2019 CLINICAL DATA:  Enlarged right ovary on CT EXAM: TRANSABDOMINAL ULTRASOUND OF PELVIS TECHNIQUE: Transabdominal ultrasound examination of the pelvis was performed including evaluation of the uterus, ovaries, adnexal regions, and pelvic cul-de-sac. COMPARISON:  None. FINDINGS: Uterus Surgically absent. Right ovary Measurements: 3.0 x 2.1 x 3.3 cm = volume: 11 mL. Poorly visualized but grossly unremarkable. Left ovary Not discretely visualized.  No adnexal mass is seen. Other findings:  No abnormal free fluid. IMPRESSION: Right ovary is poorly visualized but grossly unremarkable on ultrasound. Left ovary is not discretely visualized but normal on recent CT. Status post hysterectomy. Electronically Signed   By: Julian Hy M.D.   On: 09/13/2019 16:05   Dg C-arm 1-60 Min  Result Date: 09/16/2019 CLINICAL DATA:  Sacroplasty. EXAM: LUMBAR SPINE - 2-3 VIEW; DG C-ARM 1-60 MIN Radiation exposure index: 10.10 mGy. COMPARISON:  None. FINDINGS: Single intraoperative fluoroscopic image was obtained of the sacrum. This demonstrates the patient be status post bilateral  sacroplasty. IMPRESSION: Fluoroscopic guidance provided during bilateral sacroplasty. Electronically Signed   By: Marijo Conception M.D.   On: 09/16/2019 15:30   Ct Renal Stone Study  Result Date: 09/12/2019 CLINICAL DATA:  Left pelvic pain for the past month. EXAM: CT ABDOMEN AND PELVIS WITHOUT CONTRAST TECHNIQUE: Multidetector CT imaging of the abdomen and pelvis was performed following the standard protocol without IV contrast. COMPARISON:   CT abdomen pelvis dated December 21, 2015. FINDINGS: Lower chest: Peribronchovascular nodularity and consolidation in the right lower lobe. Trace right pleural effusion. Hepatobiliary: No focal liver abnormality is seen. No gallstones, gallbladder wall thickening, or biliary dilatation. Pancreas: Mild atrophy. No ductal dilatation or surrounding inflammatory changes. Spleen: Normal in size without focal abnormality. Adrenals/Urinary Tract: New indeterminate 1.6 x 1.5 cm left adrenal nodule. The right adrenal gland is unremarkable. 9 mm calculus in the right renal pelvis. No hydronephrosis. Subcentimeter hemorrhagic/proteinaceous left renal cyst again noted. Mild circumferential bladder wall thickening. Stomach/Bowel: Surgical clips near the gastroesophageal junction. Stomach is within normal limits. Appendix appears normal. No evidence of bowel wall thickening, distention, or inflammatory changes. Vascular/Lymphatic: Aortic atherosclerosis. No enlarged abdominal or pelvic lymph nodes. Reproductive: Status post hysterectomy. Mild asymmetric enlargement of the right ovary, increased in size since 2017, with small areas of hypodensity. The left ovary is unremarkable. Other: No abdominal wall hernia or abnormality. No abdominopelvic ascites. No pneumoperitoneum. Musculoskeletal: Acute to subacute fractures of the bilateral sacral ala, S1 and S4 vertebral bodies, right parasymphyseal pubic bone, and left inferior pubic ramus. Cortical irregularity and indistinctness of the right parasymphyseal pubic bone and left inferior pubic ramus. Cortical irregularity and destruction of the right anterior iliac wing with small adjacent soft tissue mass. Small lytic lesion in the left inferior sacral ala (series 2, image 61). Possible acute to subacute small corner fracture of the L5 anterior inferior endplate. Chronic appearing moderate L1 superior endplate compression deformity, new since 2017. IMPRESSION: 1. Multiple acute to  subacute pelvic fractures involving the S1 and S4 vertebral bodies, bilateral sacral ala, right parasymphyseal pubic bone, and left inferior pubic ramus. While the sacral fractures are likely insufficiency related, bony irregularity involving the right parasymphyseal pubic bone, left inferior pubic ramus, and right anterior iliac wing are concerning for underlying myeloma or metastatic disease. The right anterior iliac wing lesion has a small soft tissue component. Additional small lytic lesion in the left inferior sacral ala. 2. Possible acute to subacute small corner fracture of the L5 anterior inferior endplate. 3. Chronic appearing moderate L1 superior endplate compression deformity, new since 2017. 4. Right lower lobe pneumonia.  Trace right pleural effusion. 5. Mild asymmetric enlargement of the right ovary, increased in size since 2017. Non-emergent outpatient pelvic ultrasound suggested for further evaluation. 6. New indeterminate 1.6 cm left adrenal nodule. In light of the bony pelvis findings, metastatic disease is not excluded. 7. 9 mm calculus in the right renal pelvis.  No hydronephrosis. 8.  Aortic atherosclerosis (ICD10-I70.0). Electronically Signed   By: Titus Dubin M.D.   On: 09/12/2019 12:57     Subjective: - no chest pain, shortness of breath, no abdominal pain, nausea or vomiting.  Feels little bit stronger today  Discharge Exam: BP (!) 141/64 (BP Location: Right Arm)    Pulse 82    Temp 98 F (36.7 C) (Oral)    Resp 17    Ht _0  (1.575 m)    Wt 68 kg    SpO2 95%    BMI 27.44 kg/m  General: Pt is alert, awake, not in acute distress Cardiovascular: RRR, S1/S2 +, no rubs, no gallops Respiratory: CTA bilaterally, no wheezing, no rhonchi Abdominal: Soft, NT, ND, bowel sounds + Extremities: no edema, no cyanosis    The results of significant diagnostics from this hospitalization (including imaging, microbiology, ancillary and laboratory) are listed below for reference.      Microbiology: Recent Results (from the past 240 hour(s))  Urine culture     Status: Abnormal   Collection Time: 09/12/19  1:13 PM   Specimen: Urine, Catheterized  Result Value Ref Range Status   Specimen Description   Final    URINE, CATHETERIZED Performed at [facility], [address], [place], [place] [address]    Special Requests   Final    NONE Performed at [facility], [address] [place], [place] [address]    Culture (A)  Final    >=100,000 COLONIES/mL AEROCOCCUS URINAE Standardized susceptibility testing for this organism is not available. Performed at [facility], [place] [address] [place], [place] [address]    Report Status 09/14/2019 FINAL  Final  Blood culture (routine x 2)     Status: None   Collection Time: 09/12/19  1:21 PM   Specimen: BLOOD  Result Value Ref Range Status   Specimen Description BLOOD Blood Culture adequate volume  Final   Special Requests   Final    BOTTLES DRAWN AEROBIC AND ANAEROBIC LEFT ANTECUBITAL   Culture   Final    NO GROWTH 5 DAYS Performed at [facility], [address], [place], [place] [address]    Report Status 09/17/2019 FINAL  Final  SARS CORONAVIRUS 2 (TAT 6-24 HRS) Nasopharyngeal Nasopharyngeal Swab     Status: None   Collection Time: 09/12/19  2:21 PM   Specimen: Nasopharyngeal Swab  Result Value Ref Range Status   SARS Coronavirus 2 NEGATIVE NEGATIVE Final    Comment: (NOTE) SARS-CoV-2 target nucleic acids are NOT DETECTED. The SARS-CoV-2 RNA is generally detectable in upper and lower respiratory specimens during the acute phase of infection. Negative results do not preclude SARS-CoV-2 infection, do not rule out co-infections with other pathogens, and should not be used as the sole basis for treatment or other patient management decisions. Negative results must be combined with clinical observations, patient history, and epidemiological information. The expected result is  Negative. Fact Sheet for Patients: SugarRoll.be Fact Sheet for Healthcare Providers: https://www.woods-mathews.com/ This test is not yet approved or cleared by the [place] FDA and  has been authorized for detection and/or diagnosis of SARS-CoV-2 by FDA under an Emergency Use Authorization (EUA). This EUA will remain  in effect (meaning this test can be used) for the duration of the COVID-19 declaration under Section 56 4(b)(1) of the Act, 21 U.S.C. section 360bbb-3(b)(1), unless the authorization is terminated or revoked sooner. Performed at [facility], [place] [address], [place], [place] [address]   Blood culture (routine x 2)     Status: None   Collection Time: 09/12/19  2:48 PM   Specimen: BLOOD  Result Value Ref Range Status   Specimen Description BLOOD RIGHT ANTECUBITAL  Final   Special Requests   Final    BOTTLES DRAWN AEROBIC AND ANAEROBIC Blood Culture adequate volume   Culture   Final    NO GROWTH 5 DAYS Performed at [facility], [address], [place], [place] [address]    Report Status 09/17/2019 FINAL  Final  SARS CORONAVIRUS 2 (TAT  6-24 HRS) Nasopharyngeal Nasopharyngeal Swab     Status: None   Collection Time: 09/18/19 11:38 AM   Specimen: Nasopharyngeal Swab  Result Value Ref Range Status   SARS Coronavirus 2 NEGATIVE NEGATIVE Final    Comment: (NOTE) SARS-CoV-2 target nucleic acids are NOT DETECTED. The SARS-CoV-2 RNA is generally detectable in upper and lower respiratory specimens during the acute phase of infection. Negative results do not preclude SARS-CoV-2 infection, do not rule out co-infections with other pathogens, and should not be used as the sole basis for treatment or other patient management decisions. Negative results must be combined with clinical observations, patient history, and epidemiological information. The expected result is Negative. Fact Sheet for  Patients: SugarRoll.be Fact Sheet for Healthcare Providers: https://www.woods-mathews.com/ This test is not yet approved or cleared by the [place] FDA and  has been authorized for detection and/or diagnosis of SARS-CoV-2 by FDA under an Emergency Use Authorization (EUA). This EUA will remain  in effect (meaning this test can be used) for the duration of the COVID-19 declaration under Section 56 4(b)(1) of the Act, 21 U.S.C. section 360bbb-3(b)(1), unless the authorization is terminated or revoked sooner. Performed at [facility], [place] [address], [place], [place] [address]      Labs: Basic Metabolic Panel: Recent Labs  Lab 09/12/19 1202 09/13/19 0527 09/14/19 0635 09/15/19 0433 09/18/19 0515  NA 137 140 138 139 138  K 4.3 4.1 3.9 3.7 3.6  CL 102 106 104 105 103  CO2 _0 GLUCOSE 90 103* 112* 100* 100*  BUN 28* 27* _1 CREATININE 1.40* 1.05* 0.92 0.98 0.84  CALCIUM 9.8 9.1 9.1 9.0 9.2   Liver Function Tests: Recent Labs  Lab 09/12/19 1202 09/13/19 0527 09/14/19 0635  AST 19 14* 15  ALT _2 ALKPHOS 137* 117 126  BILITOT 0.7 0.5 0.7  PROT 7.0 5.9* 6.5  ALBUMIN 3.3* 2.8* 2.8*   CBC: Recent Labs  Lab 09/12/19 1202 09/13/19 0527 09/14/19 0635 09/14/19 1653 09/18/19 0515  WBC 18.0* 12.7* 12.4* 11.8* 8.0  NEUTROABS  --   --  10.3*  --   --   HGB 10.3* 9.4* 9.8* 9.3* 9.2*  HCT 32.9* 30.3* 31.1* 29.6* 30.0*  MCV 81.4 82.8 80.2 80.0 81.3  PLT 200 171 198 197 194   CBG: No results for input(s): GLUCAP in the last 168 hours. Hgb A1c No results for input(s): HGBA1C in the last 72 hours. Lipid Profile No results for input(s): CHOL, HDL, LDLCALC, TRIG, CHOLHDL, LDLDIRECT in the last 72 hours. Thyroid function studies No results for input(s): TSH, T4TOTAL, T3FREE, THYROIDAB in the last 72 hours.  Invalid input(s): FREET3 Urinalysis    Component Value Date/Time   COLORURINE YELLOW (A)  09/12/2019 1313   APPEARANCEUR CLOUDY (A) 09/12/2019 1313   LABSPEC 1.020 09/12/2019 1313   PHURINE 5.0 09/12/2019 1313   GLUCOSEU NEGATIVE 09/12/2019 1313   GLUCOSEU NEGATIVE 02/15/2018 1258   HGBUR NEGATIVE 09/12/2019 1313   BILIRUBINUR NEGATIVE 09/12/2019 1313   BILIRUBINUR Negative 08/21/2018 1120   KETONESUR NEGATIVE 09/12/2019 1313   PROTEINUR 100 (A) 09/12/2019 1313   UROBILINOGEN 0.2 08/21/2018 1120   UROBILINOGEN 0.2 02/15/2018 1258   NITRITE NEGATIVE 09/12/2019 1313   LEUKOCYTESUR LARGE (A) 09/12/2019 1313    FURTHER DISCHARGE INSTRUCTIONS:   Get Medicines reviewed and adjusted: Please take all your medications with you for your next visit with your Primary MD   Laboratory/radiological data: Please  request your Primary MD to go over all hospital tests and procedure/radiological results at the follow up, please ask your Primary MD to get all Hospital records sent to his/her office.   In some cases, they will be blood work, cultures and biopsy results pending at the time of your discharge. Please request that your primary care M.D. goes through all the records of your hospital data and follows up on these results.   Also Note the following: If you experience worsening of your admission symptoms, develop shortness of breath, life threatening emergency, suicidal or homicidal thoughts you must seek medical attention immediately by calling 911 or calling your MD immediately  if symptoms less severe.   You must read complete instructions/literature along with all the possible adverse reactions/side effects for all the Medicines you take and that have been prescribed to you. Take any new Medicines after you have completely understood and accpet all the possible adverse reactions/side effects.    Do not drive when taking Pain medications or sleeping medications (Benzodaizepines)   Do not take more than prescribed Pain, Sleep and Anxiety Medications. It is not advisable to combine  anxiety,sleep and pain medications without talking with your primary care practitioner   Special Instructions: If you have smoked or chewed Tobacco  in the last 2 yrs please stop smoking, stop any regular Alcohol  and or any Recreational drug use.   Wear Seat belts while driving.   Please note: You were cared for by a hospitalist during your hospital stay. Once you are discharged, your primary care physician will handle any further medical issues. Please note that NO REFILLS for any discharge medications will be authorized once you are discharged, as it is imperative that you return to your primary care physician (or establish a relationship with a primary care physician if you do not have one) for your post hospital discharge needs so that they can reassess your need for medications and monitor your lab values.  Time coordinating discharge: 40 minutes  SIGNED:  Marzetta Board, MD, PhD 09/19/2019, 10:14 AM

## 2019-09-19 NOTE — Progress Notes (Signed)
Report called to [facility]. Madlyn Frankel, RN

## 2019-09-24 DIAGNOSIS — R2681 Unsteadiness on feet: Secondary | ICD-10-CM | POA: Diagnosis not present

## 2019-09-24 DIAGNOSIS — M810 Age-related osteoporosis without current pathological fracture: Secondary | ICD-10-CM | POA: Diagnosis not present

## 2019-09-24 DIAGNOSIS — S3282XD Multiple fractures of pelvis without disruption of pelvic ring, subsequent encounter for fracture with routine healing: Secondary | ICD-10-CM | POA: Diagnosis not present

## 2019-09-24 DIAGNOSIS — J189 Pneumonia, unspecified organism: Secondary | ICD-10-CM | POA: Diagnosis not present

## 2019-09-24 DIAGNOSIS — R918 Other nonspecific abnormal finding of lung field: Secondary | ICD-10-CM | POA: Diagnosis not present

## 2019-09-24 DIAGNOSIS — E46 Unspecified protein-calorie malnutrition: Secondary | ICD-10-CM | POA: Diagnosis not present

## 2019-09-24 DIAGNOSIS — D649 Anemia, unspecified: Secondary | ICD-10-CM | POA: Diagnosis not present

## 2019-09-24 DIAGNOSIS — G3184 Mild cognitive impairment, so stated: Secondary | ICD-10-CM | POA: Diagnosis not present

## 2019-10-14 ENCOUNTER — Other Ambulatory Visit: Payer: Self-pay | Admitting: Primary Care

## 2019-10-14 DIAGNOSIS — G4700 Insomnia, unspecified: Secondary | ICD-10-CM

## 2019-11-07 DEATH — deceased

## 2019-11-09 ENCOUNTER — Other Ambulatory Visit: Payer: Self-pay | Admitting: Primary Care

## 2019-11-09 DIAGNOSIS — J309 Allergic rhinitis, unspecified: Secondary | ICD-10-CM
